# Patient Record
Sex: Female | Born: 1965 | Race: White | Hispanic: No | Marital: Single | Smoking: Never smoker
Health system: Southern US, Community
[De-identification: ages and names within clinical notes are randomized; demographics above are authoritative.]

## PROBLEM LIST (undated history)

## (undated) DIAGNOSIS — F329 Major depressive disorder, single episode, unspecified: Secondary | ICD-10-CM

## (undated) DIAGNOSIS — F419 Anxiety disorder, unspecified: Secondary | ICD-10-CM

## (undated) DIAGNOSIS — K200 Eosinophilic esophagitis: Secondary | ICD-10-CM

## (undated) DIAGNOSIS — G4730 Sleep apnea, unspecified: Secondary | ICD-10-CM

## (undated) DIAGNOSIS — T7840XA Allergy, unspecified, initial encounter: Secondary | ICD-10-CM

## (undated) DIAGNOSIS — H269 Unspecified cataract: Secondary | ICD-10-CM

## (undated) DIAGNOSIS — K2970 Gastritis, unspecified, without bleeding: Secondary | ICD-10-CM

## (undated) DIAGNOSIS — K219 Gastro-esophageal reflux disease without esophagitis: Secondary | ICD-10-CM

## (undated) DIAGNOSIS — S0300XA Dislocation of jaw, unspecified side, initial encounter: Secondary | ICD-10-CM

## (undated) DIAGNOSIS — K529 Noninfective gastroenteritis and colitis, unspecified: Secondary | ICD-10-CM

## (undated) DIAGNOSIS — K760 Fatty (change of) liver, not elsewhere classified: Secondary | ICD-10-CM

## (undated) DIAGNOSIS — E079 Disorder of thyroid, unspecified: Secondary | ICD-10-CM

## (undated) DIAGNOSIS — R7303 Prediabetes: Secondary | ICD-10-CM

## (undated) DIAGNOSIS — F32A Depression, unspecified: Secondary | ICD-10-CM

## (undated) DIAGNOSIS — E785 Hyperlipidemia, unspecified: Secondary | ICD-10-CM

## (undated) HISTORY — DX: Anxiety disorder, unspecified: F41.9

## (undated) HISTORY — DX: Dislocation of jaw, unspecified side, initial encounter: S03.00XA

## (undated) HISTORY — DX: Noninfective gastroenteritis and colitis, unspecified: K52.9

## (undated) HISTORY — DX: Eosinophilic esophagitis: K20.0

## (undated) HISTORY — DX: Depression, unspecified: F32.A

## (undated) HISTORY — PX: NASAL SINUS SURGERY: SHX719

## (undated) HISTORY — DX: Major depressive disorder, single episode, unspecified: F32.9

## (undated) HISTORY — DX: Hyperlipidemia, unspecified: E78.5

## (undated) HISTORY — PX: OTHER SURGICAL HISTORY: SHX169

## (undated) HISTORY — DX: Gastro-esophageal reflux disease without esophagitis: K21.9

## (undated) HISTORY — DX: Unspecified cataract: H26.9

## (undated) HISTORY — DX: Allergy, unspecified, initial encounter: T78.40XA

## (undated) HISTORY — DX: Fatty (change of) liver, not elsewhere classified: K76.0

## (undated) HISTORY — DX: Gastritis, unspecified, without bleeding: K29.70

## (undated) HISTORY — PX: WRIST SURGERY: SHX841

## (undated) HISTORY — PX: BREAST ENHANCEMENT SURGERY: SHX7

## (undated) HISTORY — DX: Disorder of thyroid, unspecified: E07.9

---

## 2004-02-13 DIAGNOSIS — E7800 Pure hypercholesterolemia, unspecified: Secondary | ICD-10-CM | POA: Insufficient documentation

## 2004-02-13 DIAGNOSIS — E785 Hyperlipidemia, unspecified: Secondary | ICD-10-CM | POA: Insufficient documentation

## 2012-05-22 DIAGNOSIS — M26609 Unspecified temporomandibular joint disorder, unspecified side: Secondary | ICD-10-CM | POA: Insufficient documentation

## 2012-05-22 DIAGNOSIS — J302 Other seasonal allergic rhinitis: Secondary | ICD-10-CM | POA: Insufficient documentation

## 2012-05-22 DIAGNOSIS — F3281 Premenstrual dysphoric disorder: Secondary | ICD-10-CM | POA: Insufficient documentation

## 2018-08-16 ENCOUNTER — Encounter: Payer: Self-pay | Admitting: Gastroenterology

## 2018-08-17 DIAGNOSIS — E039 Hypothyroidism, unspecified: Secondary | ICD-10-CM | POA: Insufficient documentation

## 2018-09-11 DIAGNOSIS — F411 Generalized anxiety disorder: Secondary | ICD-10-CM | POA: Insufficient documentation

## 2018-09-11 DIAGNOSIS — F332 Major depressive disorder, recurrent severe without psychotic features: Secondary | ICD-10-CM | POA: Insufficient documentation

## 2018-09-22 ENCOUNTER — Encounter: Payer: Self-pay | Admitting: Psychiatry

## 2018-09-22 ENCOUNTER — Ambulatory Visit (INDEPENDENT_AMBULATORY_CARE_PROVIDER_SITE_OTHER): Payer: PRIVATE HEALTH INSURANCE | Admitting: Psychiatry

## 2018-09-22 VITALS — BP 103/64 | HR 62

## 2018-09-22 DIAGNOSIS — G4700 Insomnia, unspecified: Secondary | ICD-10-CM | POA: Insufficient documentation

## 2018-09-22 DIAGNOSIS — F411 Generalized anxiety disorder: Secondary | ICD-10-CM | POA: Diagnosis not present

## 2018-09-22 DIAGNOSIS — F331 Major depressive disorder, recurrent, moderate: Secondary | ICD-10-CM | POA: Diagnosis not present

## 2018-09-22 DIAGNOSIS — F5101 Primary insomnia: Secondary | ICD-10-CM

## 2018-09-22 MED ORDER — DOXEPIN HCL 10 MG PO CAPS: ORAL_CAPSULE | ORAL | 4 refills | Status: DC

## 2018-09-22 MED ORDER — BUSPIRONE HCL 30 MG PO TABS: 30.0000 mg | ORAL_TABLET | Freq: Two times a day (BID) | ORAL | 2 refills | Status: DC

## 2018-09-22 MED ORDER — SERTRALINE HCL 100 MG PO TABS: 200.0000 mg | ORAL_TABLET | Freq: Every day | ORAL | 4 refills | Status: DC

## 2018-09-22 NOTE — Progress Notes (Signed)
Raffaella Edison [id number] 30-Dec-1965 52 y.o.  Subjective:   Patient ID:  Amy Mcintyre is a 52 y.o. (DOB June 12, 1966) female.  Chief Complaint:  Chief Complaint  Patient presents with  . Anxiety  . Depression  . Sleeping Problem    HPI Amy Mcintyre presents to the office today for follow-up of depression, anxiety, and insomnia. She reports that she had a 'rough patch" for about a week during cross-titration of medication and that this has improved. She reports that her motivation is still low. Reports that she does not have motivation to do anything outside of going to work. Energy has not been good. Reports that sleep is disturbed due to night sweats and is awakening multiple times. Falls asleep without difficulty. Reports that she has continued anxiety, particularly in the morning. Notices anxious thoughts in the morning and has been using meditation to help with this. Reports feeling overwhelmed about the day ahead in the morning. Reports that she has had some GI s/s in the morning and questions if it may be stress related. Reports that TMJ has been worse recently. Has noticed increase in checking routines in the morning and will check things exessively before going to work. Denies any checking behaviors at work. She reports that she does not see a significant difference with Sertraline. Denies recent panic attacks. Describes mood as "I haven't been too down, but I haven't been too happy either." Denies affective dulling.Denies irritability. Reports some anhedonia and decrease in interest. Reports concentration has been "ok" with increased difficulty with concentration in the mornings when anxiety is higher. Denies SI.   Continue stress at work- "It is what it is" and reports that she is not working on the weekends. Reports relationship with ex-husband is "fine."   Medications: I have reviewed the patient's current medications.  Current Outpatient Medications  Medication Sig Dispense Refill  .  atenolol (TENORMIN) 25 MG tablet Take by mouth every morning.    Marland Kitchen atorvastatin (LIPITOR) 40 MG tablet Take 40 mg by mouth daily.    . busPIRone (BUSPAR) 30 MG tablet Take 1 tablet (30 mg total) by mouth 2 (two) times daily. 60 tablet 2  . cholecalciferol (VITAMIN D) 1000 units tablet Take 1,000 Units by mouth daily.    . Eszopiclone 3 MG TABS Take 3 mg by mouth at bedtime. Take immediately before bedtime    . levothyroxine (SYNTHROID, LEVOTHROID) 88 MCG tablet Take 88 mcg by mouth daily.    . meloxicam (MOBIC) 15 MG tablet Take 15 mg by mouth daily as needed for pain.    . naproxen sodium (ALEVE) 220 MG tablet Take 220 mg by mouth daily as needed.    . Omega-3 Fatty Acids (FISH OIL) 1000 MG CAPS Take by mouth.    . doxepin (SINEQUAN) 10 MG capsule Take 1-2 po QHS 60 capsule 4  . sertraline (ZOLOFT) 100 MG tablet Take 2 tablets (200 mg total) by mouth daily. 60 tablet 4   No current facility-administered medications for this visit.     Medication Side Effects: None  Allergies:  Allergies  Allergen Reactions  . Ambien [Zolpidem Tartrate]   . Erythromycin   . Penicillins     Past Medical History:  Diagnosis Date  . Colitis   . Hyperlipidemia   . TMJ (dislocation of temporomandibular joint)     Family History  Problem Relation Age of Onset  . Depression Mother   . Anxiety disorder Mother   . Panic disorder Mother   .  Anxiety disorder Father   . Depression Father   . Depression Maternal Grandfather   . Depression Paternal Grandmother     Social History   Socioeconomic History  . Marital status: Divorced    Spouse name: Not on file  . Number of children: Not on file  . Years of education: Not on file  . Highest education level: Not on file  Occupational History  . Not on file  Social Needs  . Financial resource strain: Not on file  . Food insecurity:    Worry: Not on file    Inability: Not on file  . Transportation needs:    Medical: Not on file    Non-medical:  Not on file  Tobacco Use  . Smoking status: Never Smoker  . Smokeless tobacco: Never Used  Substance and Sexual Activity  . Alcohol use: Not on file  . Drug use: Not on file  . Sexual activity: Not on file  Lifestyle  . Physical activity:    Days per week: Not on file    Minutes per session: Not on file  . Stress: Not on file  Relationships  . Social connections:    Talks on phone: Not on file    Gets together: Not on file    Attends religious service: Not on file    Active member of club or organization: Not on file    Attends meetings of clubs or organizations: Not on file    Relationship status: Not on file  . Intimate partner violence:    Fear of current or ex partner: Not on file    Emotionally abused: Not on file    Physically abused: Not on file    Forced sexual activity: Not on file  Other Topics Concern  . Not on file  Social History Narrative  . Not on file    Past Medical History, Surgical history, Social history, and Family history were reviewed and updated as appropriate.   Please see review of systems for further details on the patient's review from today.   Review of Systems:  Review of Systems  Constitutional: Negative for fever.  Gastrointestinal: Positive for abdominal distention, diarrhea, nausea and vomiting.  Musculoskeletal:       TMJ  Neurological: Negative for headaches.    Objective:   Physical Exam:  BP 103/64   Pulse 62   Physical Exam  Constitutional: She is oriented to person, place, and time. She appears well-developed. No distress.  Musculoskeletal: Normal range of motion.  Neurological: She is alert and oriented to person, place, and time. Coordination normal.  Psychiatric: Her speech is normal and behavior is normal. Judgment and thought content normal. Her mood appears anxious. Her affect is not angry, not blunt, not labile and not inappropriate. Cognition and memory are normal. She exhibits a depressed mood. She expresses no  homicidal and no suicidal ideation. She expresses no suicidal plans and no homicidal plans.    Lab Review:  No results found for: NA, K, CL, CO2, GLUCOSE, BUN, CREATININE, CALCIUM, PROT, ALBUMIN, AST, ALT, ALKPHOS, BILITOT, GFRNONAA, GFRAA  No results found for: WBC, RBC, HGB, HCT, PLT, MCV, MCH, MCHC, RDW, LYMPHSABS, MONOABS, EOSABS, BASOSABS  No results found for: POCLITH, LITHIUM   No results found for: PHENYTOIN, PHENOBARB, VALPROATE, CBMZ   .res Assessment: Plan:   Pt. Seen for 30 minutes and greater than 50% of session spent counseling pt re: potential benefits, risks, and side effects of increasing Sertraline to 200 mg po  qd to improve mood and anxiety. Discussed that she could try taking Lunesta prn only. Also discussed that she could contact office if she would like to consider lowering dose of Lunesta. Pt verbalized understanding. Pt to f/u in 3 months or sooner if clinically indicated.    Generalized anxiety disorder - Plan: sertraline (ZOLOFT) 100 MG tablet, busPIRone (BUSPAR) 30 MG tablet  Moderate episode of recurrent major depressive disorder ([place]) - Plan: sertraline (ZOLOFT) 100 MG tablet  Primary insomnia - Plan: doxepin (SINEQUAN) 10 MG capsule  Please see After Visit Summary for patient specific instructions.  Future Appointments  Date Time Provider [place]  09/25/2018  3:30 PM Thornton Park, MD [facility]  12/22/2018  3:15 PM Thayer Headings, PMHNP [facility] None    No orders of the defined types were placed in this encounter.     -------------------------------

## 2018-09-25 ENCOUNTER — Encounter: Payer: Self-pay | Admitting: Gastroenterology

## 2018-09-25 ENCOUNTER — Other Ambulatory Visit (INDEPENDENT_AMBULATORY_CARE_PROVIDER_SITE_OTHER): Payer: PRIVATE HEALTH INSURANCE

## 2018-09-25 ENCOUNTER — Ambulatory Visit (INDEPENDENT_AMBULATORY_CARE_PROVIDER_SITE_OTHER): Payer: PRIVATE HEALTH INSURANCE | Admitting: Gastroenterology

## 2018-09-25 VITALS — BP 92/60 | HR 64 | Ht 58.75 in | Wt 138.2 lb

## 2018-09-25 DIAGNOSIS — R194 Change in bowel habit: Secondary | ICD-10-CM

## 2018-09-25 DIAGNOSIS — R197 Diarrhea, unspecified: Secondary | ICD-10-CM

## 2018-09-25 LAB — TSH: TSH: 0.22 u[IU]/mL — ABNORMAL LOW (ref 0.35–4.50)

## 2018-09-25 LAB — SEDIMENTATION RATE: Sed Rate: 7 mm/hr (ref 0–30)

## 2018-09-25 LAB — IGA: IgA: 185 mg/dL (ref 68–378)

## 2018-09-25 LAB — C-REACTIVE PROTEIN: CRP: 0.2 mg/dL — AB (ref 0.5–20.0)

## 2018-09-25 NOTE — Patient Instructions (Addendum)
Your provider has requested that you go to the basement level for lab work before leaving today. Press "B" on the elevator. The lab is located at the first door on the left as you exit the elevator.  You have been scheduled for an endoscopy and colonoscopy. Please follow the written instructions given to you at your visit today. Please pick up your prep supplies at the pharmacy within the next 1-3 days. If you use inhalers (even only as needed), please bring them with you on the day of your procedure. Your physician has requested that you go to www.startemmi.com and enter the access code given to you at your visit today. This web site gives a general overview about your procedure. However, you should still follow specific instructions given to you by our office regarding your preparation for the procedure.  

## 2018-09-25 NOTE — Progress Notes (Signed)
Referring Provider: No ref. provider found Primary Care Physician:  Tobe Sos, MD   Reason for Consultation:  Change in bowel habits   IMPRESSION:  Change in bowel habits with diarrhea and intermittent bloating x 1 year    - not responding to trial of probiotics Prior endoscopy in 2012 in [place], [place] History of bleeding hemorrhoids  PLAN: The differential diagnosis of chronic diarrhea without alarm features includes: irritable bowel syndrome, IBD, celiac disease, missed infection (such as giardia), food intolerance,microscopic colitis, thyroid disorder, other functional GI disease. By history, this is less likely to be obstruction.   - Fecal calprotectin, ESR, and CRP to screen for IBD - Giardia testing - TSH - IgA tissue transglutaminase, IgA level to test for celiac disease - EGD with duodenal biopsies given the patient's prior medical care - Colonoscopy with random biopsies and evaluation of the terminal ileum - Obtain records from 2012 with Dr. West Carbo in [place], [place]  I consented the patient at the bedside today discussing the risks, benefits, and alternatives to endoscopic evaluation. In particular, we discussed the risks that include, but are not limited to, reaction to medication, cardiopulmonary compromise, bleeding requiring blood transfusion, aspiration resulting in pneumonia, perforation requiring surgery, and even death. We reviewed the risk of missed lesion including polyps or even cancer. The patient acknowledges these risks and asks that we proceed.   HPI: Amy Mcintyre is a 52 y.o. female seen in consultation for diarrhea, nausea, vomiting, bloating, and eructation. The history is obtained through the patient. Symptoms developed last year. Since late spring she has experienced frequent, daily diarrhea. 3-4 loose BM daily. Baseline 1 BM daily.  Progressively worse since that time. Sometimes more formed than others. Mustardy yellow with some mucous. No blood.    Mornings are particularly bad. Multiple BM before noon. Some early morning nausea and vomiting but this is rare. No fevers, chills, or night sweats. Weight has unintentionally increased.    She is most bothered by associated distension that wil last for hours and fatigue. Resolves spontaneous. +eructation. Not malodorous.  Some days she is unable to wear her clothes because of her increased abdominal girth. On those days there is also anorexia. No other associated symptoms. No identified exacerbating or relieving features.  Immodium PRN with minimal relief. Yoga. Meditation. Sees a counseor. Medications for anxiety and depression. Previously on Probiotics (can't remember the name) but they didn't seem to help after several weeks. She has tried to clue in to her diet but can't determine any patterns.   She has stress and anxiety. She is worried that she may have an organic process in addition to the stress.  Last colonoscopy in 2012 when she was having hemorrhoids that were banded.   Father had colon polyps. No known history of cancer.   Past Medical History:  Diagnosis Date  . Anxiety   . Colitis   . Depression   . Eosinophilic esophagitis   . Hyperlipidemia   . Thyroid disease   . TMJ (dislocation of temporomandibular joint)     Past Surgical History:  Procedure Laterality Date  . BREAST ENHANCEMENT SURGERY    . CESAREAN SECTION    . tummy tuck    . WRIST SURGERY Left     Prior to Admission medications   Medication Sig Start Date End Date Taking? Authorizing Provider  atenolol (TENORMIN) 25 MG tablet Take by mouth every morning.   Yes [provider]  atorvastatin (LIPITOR) 40 MG tablet Take 40  mg by mouth daily.   Yes [provider]  busPIRone (BUSPAR) 30 MG tablet Take 1 tablet (30 mg total) by mouth 2 (two) times daily. 09/22/18  Yes Thayer Headings, PMHNP  cholecalciferol (VITAMIN D) 1000 units tablet Take 1,000 Units by mouth daily.   Yes [provider]  doxepin (SINEQUAN) 10 MG capsule Take 1-2 po QHS 09/22/18  Yes Thayer Headings, PMHNP  Eszopiclone 3 MG TABS Take 3 mg by mouth at bedtime. Take immediately before bedtime   Yes [provider]  levothyroxine (SYNTHROID, LEVOTHROID) 88 MCG tablet Take 88 mcg by mouth daily.   Yes [provider]  meloxicam (MOBIC) 15 MG tablet Take 15 mg by mouth daily as needed for pain.   Yes [provider]  naproxen sodium (ALEVE) 220 MG tablet Take 220 mg by mouth daily as needed.   Yes [provider]  Omega-3 Fatty Acids (FISH OIL) 1000 MG CAPS Take by mouth.   Yes [provider]  sertraline (ZOLOFT) 100 MG tablet Take 2 tablets (200 mg total) by mouth daily. 09/22/18 10/22/18 Yes Thayer Headings, PMHNP    Current Outpatient Medications  Medication Sig Dispense Refill  . atenolol (TENORMIN) 25 MG tablet Take by mouth every morning.    Marland Kitchen atorvastatin (LIPITOR) 40 MG tablet Take 40 mg by mouth daily.    . busPIRone (BUSPAR) 30 MG tablet Take 1 tablet (30 mg total) by mouth 2 (two) times daily. 60 tablet 2  . cholecalciferol (VITAMIN D) 1000 units tablet Take 1,000 Units by mouth daily.    Marland Kitchen doxepin (SINEQUAN) 10 MG capsule Take 1-2 po QHS 60 capsule 4  . Eszopiclone 3 MG TABS Take 3 mg by mouth at bedtime. Take immediately before bedtime    . levothyroxine (SYNTHROID, LEVOTHROID) 88 MCG tablet Take 88 mcg by mouth daily.    . meloxicam (MOBIC) 15 MG tablet Take 15 mg by mouth daily as needed for pain.    . naproxen sodium (ALEVE) 220 MG tablet Take 220 mg by mouth daily as needed.    . Omega-3 Fatty Acids (FISH OIL) 1000 MG CAPS Take by mouth.    . sertraline (ZOLOFT) 100 MG tablet Take 2 tablets (200 mg total) by mouth daily. 60 tablet 4   No current facility-administered medications for this visit.     Allergies as of 09/25/2018 - Review Complete 09/25/2018  Allergen Reaction Noted  . Ambien [zolpidem tartrate]  09/22/2018  .  Erythromycin Swelling 09/22/2018  . Penicillins Swelling 09/22/2018    Family History  Problem Relation Age of Onset  . Depression Mother   . Anxiety disorder Mother   . Panic disorder Mother   . Anxiety disorder Father   . Depression Father   . Colon polyps Father   . Depression Maternal Grandfather   . Depression Paternal Grandmother   . Colon cancer Neg Hx   . Esophageal cancer Neg Hx   . Pancreatic cancer Neg Hx   . Stomach cancer Neg Hx   . Liver disease Neg Hx     Social History   Socioeconomic History  . Marital status: Divorced    Spouse name: Not on file  . Number of children: Not on file  . Years of education: Not on file  . Highest education level: Not on file  Occupational History  . Not on file  Social Needs  . Financial resource strain: Not on file  . Food insecurity:    Worry: Not  on file    Inability: Not on file  . Transportation needs:    Medical: Not on file    Non-medical: Not on file  Tobacco Use  . Smoking status: Never Smoker  . Smokeless tobacco: Never Used  Substance and Sexual Activity  . Alcohol use: Yes    Comment: socially  . Drug use: Never  . Sexual activity: Not on file  Lifestyle  . Physical activity:    Days per week: Not on file    Minutes per session: Not on file  . Stress: Not on file  Relationships  . Social connections:    Talks on phone: Not on file    Gets together: Not on file    Attends religious service: Not on file    Active member of club or organization: Not on file    Attends meetings of clubs or organizations: Not on file    Relationship status: Not on file  . Intimate partner violence:    Fear of current or ex partner: Not on file    Emotionally abused: Not on file    Physically abused: Not on file    Forced sexual activity: Not on file  Other Topics Concern  . Not on file  Social History Narrative  . Not on file    Review of Systems: 12 system ROS is negative except as noted above except for  allergies, anxiety, depression, fatigue, night sweats, and insomnia.   Physical Exam: 92/60 64   General:   Alert, well-nourished, pleasant and cooperative in NAD. She appears her stated age. Head:  Normocephalic and atraumatic. Eyes:  Sclera clear, no icterus.   Conjunctiva pink. Mouth:  No deformity or lesions.   Neck:  Supple; no thyromegaly. Lungs:  Clear throughout to auscultation.   No wheezes.  Heart:  Regular rate and rhythm; no murmurs Abdomen:  Soft, nontender, normal bowel sounds. No tympany. No rebound or guarding. No hepatosplenomegaly. Rectal:  Deferred  Msk:  Symmetrical without gross deformities. Extremities:  No gross deformities or edema. Neurologic:  Alert and  oriented x4;  grossly nonfocal Skin:  No rash or bruise. Psych:  Alert and cooperative. Normal mood and affect.    Aniceto Kyser L. Tarri Glenn Md, MPH [facility] 09/25/2018, 3:41 PM

## 2018-09-26 LAB — TISSUE TRANSGLUTAMINASE, IGA: (tTG) Ab, IgA: 1 U/mL

## 2018-10-05 ENCOUNTER — Other Ambulatory Visit: Payer: PRIVATE HEALTH INSURANCE

## 2018-10-05 ENCOUNTER — Telehealth: Payer: Self-pay | Admitting: Gastroenterology

## 2018-10-05 DIAGNOSIS — R194 Change in bowel habit: Secondary | ICD-10-CM

## 2018-10-05 NOTE — Telephone Encounter (Signed)
I agree that she should reach out to her PCP if she is having chest pain. I did not think that a C diff was needed. However, if she is worried about this, please see if the order can be added to her stool sample. Thank you.

## 2018-10-05 NOTE — Telephone Encounter (Signed)
Thank you :)

## 2018-10-05 NOTE — Telephone Encounter (Signed)
Patient states that she just dropped stool sample off and wants to let nurse know that it wasn't like it's been it's no0t liquid, will that matter?

## 2018-10-05 NOTE — Telephone Encounter (Signed)
Spoke to patient let her know I was not sure about the stool sample. I do know that for Cdiff, it needs to be liquid but this was not one of the tests ordered. Patient also thinks she is having chest pain due to her anxiety. States that she was fine while here, but after being home and "thinking about her procedures" she started feeling tightness in her chest. She does take medication for anxiety and I asked her to contact her PCP to have them evaluate her.

## 2018-10-05 NOTE — Telephone Encounter (Signed)
Sorry, she was not concerned about Cdiff, she just wanted to make sure that her more formed stool would be acceptable for the tests. Mainly, just reassurance.

## 2018-10-06 LAB — GIARDIA ANTIGEN
MICRO NUMBER:: 91311756
RESULT:: NOT DETECTED
SPECIMEN QUALITY: ADEQUATE

## 2018-10-12 LAB — CALPROTECTIN, FECAL: CALPROTECTIN, FECAL: 19 ug/g (ref 0–120)

## 2018-10-13 ENCOUNTER — Encounter: Payer: Self-pay | Admitting: Gastroenterology

## 2018-10-19 ENCOUNTER — Ambulatory Visit (AMBULATORY_SURGERY_CENTER): Payer: PRIVATE HEALTH INSURANCE | Admitting: Gastroenterology

## 2018-10-19 ENCOUNTER — Encounter: Payer: Self-pay | Admitting: Gastroenterology

## 2018-10-19 ENCOUNTER — Telehealth: Payer: Self-pay | Admitting: Gastroenterology

## 2018-10-19 ENCOUNTER — Encounter: Payer: Self-pay | Admitting: Emergency Medicine

## 2018-10-19 VITALS — BP 133/82 | HR 76 | Temp 99.1°F | Resp 12 | Ht <= 58 in | Wt 138.0 lb

## 2018-10-19 DIAGNOSIS — K648 Other hemorrhoids: Secondary | ICD-10-CM

## 2018-10-19 DIAGNOSIS — K222 Esophageal obstruction: Secondary | ICD-10-CM | POA: Diagnosis not present

## 2018-10-19 DIAGNOSIS — R194 Change in bowel habit: Secondary | ICD-10-CM

## 2018-10-19 DIAGNOSIS — K3189 Other diseases of stomach and duodenum: Secondary | ICD-10-CM | POA: Diagnosis not present

## 2018-10-19 DIAGNOSIS — R197 Diarrhea, unspecified: Secondary | ICD-10-CM

## 2018-10-19 DIAGNOSIS — D124 Benign neoplasm of descending colon: Secondary | ICD-10-CM | POA: Diagnosis not present

## 2018-10-19 DIAGNOSIS — K5730 Diverticulosis of large intestine without perforation or abscess without bleeding: Secondary | ICD-10-CM | POA: Diagnosis not present

## 2018-10-19 DIAGNOSIS — K2900 Acute gastritis without bleeding: Secondary | ICD-10-CM

## 2018-10-19 MED ORDER — SODIUM CHLORIDE 0.9 % IV SOLN: 500.0000 mL | Freq: Once | INTRAVENOUS | Status: DC

## 2018-10-19 MED ORDER — PANTOPRAZOLE SODIUM 40 MG PO TBEC: 40.0000 mg | DELAYED_RELEASE_TABLET | Freq: Every day | ORAL | 3 refills | Status: DC

## 2018-10-19 NOTE — Op Note (Addendum)
[facility] Patient Name: Amy Mcintyre Procedure Date: 10/19/2018 12:54 PM MRN: [id number] Endoscopist: Thornton Park MD, MD Age: 52 Referring MD:  Date of Birth: 02-25-1966 Gender: Female Account #: [id number] Procedure:                Colonoscopy Indications:              Change in bowel habits Medicines:                See the Anesthesia note for documentation of the                            administered medications Procedure:                Pre-Anesthesia Assessment:                           - Prior to the procedure, a History and Physical                            was performed, and patient medications and                            allergies were reviewed. The patient's tolerance of                            previous anesthesia was also reviewed. The risks                            and benefits of the procedure and the sedation                            options and risks were discussed with the patient.                            All questions were answered, and informed consent                            was obtained. Prior Anticoagulants: The patient has                            taken no previous anticoagulant or antiplatelet                            agents. ASA Grade Assessment: III - A patient with                            severe systemic disease. After reviewing the risks                            and benefits, the patient was deemed in                            satisfactory condition to undergo the procedure.  After obtaining informed consent, the colonoscope                            was passed under direct vision. Throughout the                            procedure, the patient's blood pressure, pulse, and                            oxygen saturations were monitored continuously. The                            Colonoscope was introduced through the anus and                            advanced to the the terminal  ileum, with                            identification of the appendiceal orifice and IC                            valve. The colonoscopy was performed without                            difficulty. The patient tolerated the procedure                            well. The quality of the bowel preparation was good. Scope In: 1:16:55 PM Scope Out: 1:31:03 PM Scope Withdrawal Time: 0 hours 11 minutes 34 seconds  Total Procedure Duration: 0 hours 14 minutes 8 seconds  Findings:                 The perianal and digital rectal examinations were                            normal.                           A few small-mouthed diverticula were found in the                            sigmoid colon and descending colon.                           A 2 mm polyp was found in the proximal descending                            colon. The polyp was sessile. The polyp was removed                            with a cold biopsy forceps. Resection and retrieval                            were complete.  The colon (entire examined portion) appeared normal                            except for focal erythema in the distal sigmoid                            colon. The inflammation is in the area of the                            diverticulosis. Biopsies were taken with a cold                            forceps for histology from the sigmoid colon                            separately from the remainder of the colon.                           Non-bleeding internal hemorrhoids were found.                           The exam was otherwise without abnormality on                            direct and retroflexion views. Complications:            No immediate complications. Estimated Blood Loss:     Estimated blood loss: none. Impression:               - Diverticulosis in the sigmoid colon and in the                            descending colon.                           - One 2 mm polyp in the  proximal descending colon,                            removed with a cold biopsy forceps. Resected and                            retrieved.                           - The entire examined colon is normal except focal                            erythema in the sigmoid colon in the area of the                            diverticulosis. Biopsied.                           - Non-bleeding internal hemorrhoids.                           -  The examination was otherwise normal on direct                            and retroflexion views. Recommendation:           - Discharge patient to home.                           - Resume previous diet. High fiber diet recommended.                           - Continue present medications.                           - Await pathology results.                           - Repeat colonoscopy in 5 years for surveillance.                           - Return to GI office in 2-4 weeks. Thornton Park MD, MD 10/19/2018 1:43:43 PM This report has been signed electronically.

## 2018-10-19 NOTE — Op Note (Signed)
[facility] Patient Name: Amy Mcintyre Procedure Date: 10/19/2018 12:55 PM MRN: [id number] Endoscopist: Thornton Park MD, MD Age: 52 Referring MD:  Date of Birth: 29-Nov-1966 Gender: Female Account #: [id number] Procedure:                Upper GI endoscopy Indications:              Abdominal bloating, Diarrhea Medicines:                See the Anesthesia note for documentation of the                            administered medications Procedure:                Pre-Anesthesia Assessment:                           - Prior to the procedure, a History and Physical                            was performed, and patient medications and                            allergies were reviewed. The patient's tolerance of                            previous anesthesia was also reviewed. The risks                            and benefits of the procedure and the sedation                            options and risks were discussed with the patient.                            All questions were answered, and informed consent                            was obtained. Prior Anticoagulants: The patient has                            taken no previous anticoagulant or antiplatelet                            agents. ASA Grade Assessment: III - A patient with                            severe systemic disease. After reviewing the risks                            and benefits, the patient was deemed in                            satisfactory condition to undergo the procedure.  After obtaining informed consent, the endoscope was                            passed under direct vision. Throughout the                            procedure, the patient's blood pressure, pulse, and                            oxygen saturations were monitored continuously. The                            Model GIF-HQ190 [id number]) scope was introduced                            through the mouth,  and advanced to the third part                            of duodenum. The upper GI endoscopy was                            accomplished without difficulty. The patient                            tolerated the procedure well. Scope In: Scope Out: Findings:                 The examined esophagus was normal although the                            esophageal lumen has a slightly ringed appearance.                            There is a widely patent Schatzki's ring at the GE                            junction. Biopsies were taken with a cold forceps                            for histology.                           Moderate inflammation was found in the gastric                            body. Biopsies were taken with a cold forceps for                            histology.                           The examined duodenum was normal. Biopsies were                            taken with a cold forceps for  histology.                           The exam was otherwise without abnormality. Complications:            No immediate complications. Estimated Blood Loss:     Estimated blood loss: none. Impression:               - Normal esophagus except for subtle ringed                            appearance. Biopsied.                           -Widely patent Schatzki's ring.                           - Gastritis. Biopsied.                           - Normal examined duodenum. Biopsied.                           - The examination was otherwise normal. Recommendation:           - Await pathology results.                           - Avoid all NSAIDs                           - Pantoprazole 40 mg daily x8 weeks                           -Proceed with colonoscopy today as previously                            planned Thornton Park MD, MD 10/19/2018 1:39:36 PM This report has been signed electronically.

## 2018-10-19 NOTE — Patient Instructions (Signed)
YOU HAD AN ENDOSCOPIC PROCEDURE TODAY AT [facility]:   Refer to the procedure report that was given to you for any specific questions about what was found during the examination.  If the procedure report does not answer your questions, please call your gastroenterologist to clarify.  If you requested that your care partner not be given the details of your procedure findings, then the procedure report has been included in a sealed envelope for you to review at your convenience later.  YOU SHOULD EXPECT: Some feelings of bloating in the abdomen. Passage of more gas than usual.  Walking can help get rid of the air that was put into your GI tract during the procedure and reduce the bloating. If you had a lower endoscopy (such as a colonoscopy or flexible sigmoidoscopy) you may notice spotting of blood in your stool or on the toilet paper. If you underwent a bowel prep for your procedure, you may not have a normal bowel movement for a few days.  Please Note:  You might notice some irritation and congestion in your nose or some drainage.  This is from the oxygen used during your procedure.  There is no need for concern and it should clear up in a day or so.  SYMPTOMS TO REPORT IMMEDIATELY:   Following lower endoscopy (colonoscopy or flexible sigmoidoscopy):  Excessive amounts of blood in the stool  Significant tenderness or worsening of abdominal pains  Swelling of the abdomen that is new, acute  Fever of 100F or higher   Following upper endoscopy (EGD)  Vomiting of blood or coffee ground material  New chest pain or pain under the shoulder blades  Painful or persistently difficult swallowing  New shortness of breath  Fever of 100F or higher  Black, tarry-looking stools  For urgent or emergent issues, a gastroenterologist can be reached at any hour by calling [phone].   DIET:  We do recommend a small meal at first, but then you may proceed to your regular diet.  Drink  plenty of fluids but you should avoid alcoholic beverages for 24 hours.  MEDICATIONS: Continue present medications. Use Pantoprazole 40 mg daily for 8 weeks. Avoid all non-steroidal anti-inflammatory drugs.  Please see handouts given to you by your recovery nurse.  Follow up with Dr. Tarri Glenn in her office in 2 to 4 weeks.  ACTIVITY:  You should plan to take it easy for the rest of today and you should NOT DRIVE or use heavy machinery until tomorrow (because of the sedation medicines used during the test).    FOLLOW UP: Our staff will call the number listed on your records the next business day following your procedure to check on you and address any questions or concerns that you may have regarding the information given to you following your procedure. If we do not reach you, we will leave a message.  However, if you are feeling well and you are not experiencing any problems, there is no need to return our call.  We will assume that you have returned to your regular daily activities without incident.  If any biopsies were taken you will be contacted by phone or by letter within the next 1-3 weeks.  Please call us at [phone] if you have not heard about the biopsies in 3 weeks.   Thank you for allowing [place] to provide for your healthcare needs today.  SIGNATURES/CONFIDENTIALITY: You and/or your care partner have signed paperwork which will be entered into  your electronic medical record.  These signatures attest to the fact that that the information above on your After Visit Summary has been reviewed and is understood.  Full responsibility of the confidentiality of this discharge information lies with you and/or your care-partner.

## 2018-10-19 NOTE — Progress Notes (Signed)
A/ox3 pleased with MAC, report to RN 

## 2018-10-19 NOTE — Progress Notes (Signed)
Called to room to assist during endoscopic procedure.  Patient ID and intended procedure confirmed with present staff. Received instructions for my participation in the procedure from the performing physician.  

## 2018-10-20 ENCOUNTER — Telehealth: Payer: Self-pay

## 2018-10-20 NOTE — Telephone Encounter (Signed)
  Follow up Call-  Call back number 10/19/2018  Post procedure Call Back phone  # [phone]  Permission to leave phone message Yes     Patient questions:  Do you have a fever, pain , or abdominal swelling? No. Pain Score  0 *  Have you tolerated food without any problems? No. Appetite is poor and this concerns pt.  Reviewed MD's recommendations.  Pantoprazole 40mg  daily for 8 weeks.  Pt wants follow-up with MD.  Advised her to follow the above recommendation/med before calling for OV and await pathology.  Pt agreed.  RN advised however if new or worsening issues call sooner.  Pt voiced that this symptom is unchanged since before EGD.  Have you been able to return to your normal activities? No.  Do you have any questions about your discharge instructions: Diet   No. Medications  No. Follow up visit  No.  Do you have questions or concerns about your Care? No.  Actions: * If pain score is 4 or above: No action needed, pain <4.

## 2018-11-01 ENCOUNTER — Telehealth: Payer: Self-pay | Admitting: Gastroenterology

## 2018-11-06 NOTE — Telephone Encounter (Signed)
Patient calling to schedule her procedure follow up.  Scheduled for 11/17/18

## 2018-11-07 ENCOUNTER — Ambulatory Visit (INDEPENDENT_AMBULATORY_CARE_PROVIDER_SITE_OTHER): Payer: PRIVATE HEALTH INSURANCE | Admitting: Psychiatry

## 2018-11-07 ENCOUNTER — Encounter: Payer: Self-pay | Admitting: Gastroenterology

## 2018-11-07 ENCOUNTER — Encounter: Payer: Self-pay | Admitting: Psychiatry

## 2018-11-07 VITALS — BP 139/87 | HR 96

## 2018-11-07 DIAGNOSIS — F331 Major depressive disorder, recurrent, moderate: Secondary | ICD-10-CM | POA: Diagnosis not present

## 2018-11-07 DIAGNOSIS — F411 Generalized anxiety disorder: Secondary | ICD-10-CM

## 2018-11-07 DIAGNOSIS — F5101 Primary insomnia: Secondary | ICD-10-CM

## 2018-11-07 MED ORDER — DEPLIN 15 15-90.314 MG PO CAPS: 15.0000 mg | ORAL_CAPSULE | Freq: Every day | ORAL | 0 refills | Status: AC

## 2018-11-07 MED ORDER — CLONAZEPAM 1 MG PO TABS: ORAL_TABLET | ORAL | 2 refills | Status: DC

## 2018-11-07 NOTE — Progress Notes (Signed)
Amy Mcintyre [id number] Aug 19, 1966 52 y.o.  Subjective:   Patient ID:  Amy Mcintyre is a 52 y.o. (DOB 30-Apr-1966) female.  Chief Complaint:  Chief Complaint  Patient presents with  . Anxiety  . Insomnia  . Depression    HPI Amy Mcintyre presents to the office today for follow-up of depression, anxiety, and insomnia.  She reports,"I have really been struggling with sleep and anxiety."  She reports that she has not noticed any significant difference with stopping Buspar or Lunesta. She feels that Sertraline has been helpful for depression since she is not crying as often. Reports that she continues to experience some depression. Reports that Doxepin does not seem to be effective. Reports that she has some nights that she will sleep only 2-4 hours a night and other nights she sleeps throughout the night and has been unable to identify any pattern or trigger. Reports energy and motivation have been ok. Reports that her appetite "comes and goes." Reports that her concentration is impaired and has difficulty staying focused at work and is easily distracted.   She reports that Klonopin was effective in the past for her anxiety and insomnia. She reports that when she had her colonoscopy she had severe anxiety with intrusive thoughts that she may die and had insomnia for one week and was unable to work during that time. She reports that she has severe anxiety in the morning and feeling nervous and thinking about what the day may hold. Reports that panic attacks have improved some over the last few weeks to months. Continued stressors at work and frequent worry about work.   Reports that during endoscopy they found a ring that is being biopsied, diverticuli, a colon polyp, and an internal hemorrhoid.   Reports relationship has been going well.   Review of Systems:  Review of Systems  Constitutional: Positive for diaphoresis.  Gastrointestinal:       Reports that her vomiting has decreased. Reports  that she continues to have some diarrhea.   Musculoskeletal: Negative for gait problem.  Neurological: Negative for tremors.       Reports that her jaw has started to go numb and it occasionally radiates to her sinus.   Psychiatric/Behavioral:       Please refer to HPI    Medications: I have reviewed the patient's current medications.  Current Outpatient Medications  Medication Sig Dispense Refill  . atorvastatin (LIPITOR) 40 MG tablet Take 40 mg by mouth daily.    Marland Kitchen levothyroxine (SYNTHROID, LEVOTHROID) 75 MCG tablet Take 75 mcg by mouth daily.     . meloxicam (MOBIC) 15 MG tablet Take 15 mg by mouth daily as needed for pain.    . Omega-3 Fatty Acids (FISH OIL) 1000 MG CAPS Take by mouth.    . pantoprazole (PROTONIX) 40 MG tablet Take 1 tablet (40 mg total) by mouth daily. 90 tablet 3  . atenolol (TENORMIN) 25 MG tablet Take by mouth every morning.    . cholecalciferol (VITAMIN D) 1000 units tablet Take 1,000 Units by mouth daily.    . clonazePAM (KLONOPIN) 1 MG tablet Take 1/2-1 tab po qd prn panic or insomnia 30 tablet 2  . L-Methylfolate-Algae (DEPLIN 15) 15-90.314 MG CAPS Take 15 mg by mouth daily. Samples provided  0  . sertraline (ZOLOFT) 100 MG tablet Take 2 tablets (200 mg total) by mouth daily. 60 tablet 4   Current Facility-Administered Medications  Medication Dose Route Frequency Provider Last Rate Last Dose  . 0.9 %  sodium chloride infusion  500 mL Intravenous Once Thornton Park, MD        Medication Side Effects: None  Allergies:  Allergies  Allergen Reactions  . Ambien [Zolpidem Tartrate]   . Erythromycin Swelling  . Penicillins Swelling    Past Medical History:  Diagnosis Date  . Anxiety   . Colitis   . Depression   . Eosinophilic esophagitis   . Gastritis   . GERD (gastroesophageal reflux disease)   . Hyperlipidemia   . Thyroid disease   . TMJ (dislocation of temporomandibular joint)     Family History  Problem Relation Age of Onset  .  Anxiety disorder Mother   . Panic disorder Mother   . OCD Mother   . Anxiety disorder Father   . Depression Father   . Colon polyps Father   . Depression Maternal Grandfather   . Depression Paternal Grandmother   . Drug abuse Brother   . Colon cancer Neg Hx   . Esophageal cancer Neg Hx   . Pancreatic cancer Neg Hx   . Stomach cancer Neg Hx   . Liver disease Neg Hx   . Rectal cancer Neg Hx     Social History   Socioeconomic History  . Marital status: Divorced    Spouse name: Not on file  . Number of children: Not on file  . Years of education: Not on file  . Highest education level: Not on file  Occupational History  . Not on file  Social Needs  . Financial resource strain: Not on file  . Food insecurity:    Worry: Not on file    Inability: Not on file  . Transportation needs:    Medical: Not on file    Non-medical: Not on file  Tobacco Use  . Smoking status: Never Smoker  . Smokeless tobacco: Never Used  Substance and Sexual Activity  . Alcohol use: Yes    Comment: socially  . Drug use: Never  . Sexual activity: Not on file  Lifestyle  . Physical activity:    Days per week: Not on file    Minutes per session: Not on file  . Stress: Not on file  Relationships  . Social connections:    Talks on phone: Not on file    Gets together: Not on file    Attends religious service: Not on file    Active member of club or organization: Not on file    Attends meetings of clubs or organizations: Not on file    Relationship status: Not on file  . Intimate partner violence:    Fear of current or ex partner: Not on file    Emotionally abused: Not on file    Physically abused: Not on file    Forced sexual activity: Not on file  Other Topics Concern  . Not on file  Social History Narrative  . Not on file    Past Medical History, Surgical history, Social history, and Family history were reviewed and updated as appropriate.   Please see review of systems for further  details on the patient's review from today.   Objective:   Physical Exam:  BP 139/87   Pulse 96   Physical Exam  Constitutional: She is oriented to person, place, and time. She appears well-developed. No distress.  Musculoskeletal: She exhibits no deformity.  Neurological: She is alert and oriented to person, place, and time. Coordination normal.  Psychiatric: Her speech is normal and behavior is normal. Judgment  and thought content normal. Her mood appears anxious. Her affect is not angry, not blunt, not labile and not inappropriate. Cognition and memory are normal. She does not exhibit a depressed mood. She expresses no homicidal and no suicidal ideation. She expresses no suicidal plans and no homicidal plans.  Insight intact. No auditory or visual hallucinations. No delusions.     Lab Review:  No results found for: NA, K, CL, CO2, GLUCOSE, BUN, CREATININE, CALCIUM, PROT, ALBUMIN, AST, ALT, ALKPHOS, BILITOT, GFRNONAA, GFRAA  No results found for: WBC, RBC, HGB, HCT, PLT, MCV, MCH, MCHC, RDW, LYMPHSABS, MONOABS, EOSABS, BASOSABS  No results found for: POCLITH, LITHIUM   No results found for: PHENYTOIN, PHENOBARB, VALPROATE, CBMZ   .res Assessment: Plan:   Patient seen for 30 minutes and greater than 50% of visit spent counseling patient to include discussing that abnormal thyroid could be negatively affecting mood and insomnia.  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  Discussed considering propanolol as needed in the future if anxiety does not improve. Continue sertraline 200 mg daily for mood and anxiety. Start Klonopin 1 mg 1/2 to 1 tablet daily as needed for anxiety or insomnia. Deplin 15 mg daily for augmentation of depression. Generalized anxiety disorder - Plan: clonazePAM (KLONOPIN) 1 MG tablet  Moderate episode of recurrent major depressive disorder (HCC)  Primary insomnia - Plan:  clonazePAM (KLONOPIN) 1 MG tablet  Please see After Visit Summary for patient specific instructions.  Future Appointments  Date Time Provider [place]  11/17/2018  8:45 AM Thornton Park, MD [facility]  12/22/2018  3:15 PM Thayer Headings, PMHNP [facility] None    No orders of the defined types were placed in this encounter.     -------------------------------

## 2018-11-17 ENCOUNTER — Ambulatory Visit (INDEPENDENT_AMBULATORY_CARE_PROVIDER_SITE_OTHER): Payer: PRIVATE HEALTH INSURANCE | Admitting: Gastroenterology

## 2018-11-17 ENCOUNTER — Encounter: Payer: Self-pay | Admitting: Gastroenterology

## 2018-11-17 VITALS — BP 128/84 | HR 96 | Ht 59.0 in | Wt 139.1 lb

## 2018-11-17 DIAGNOSIS — R194 Change in bowel habit: Secondary | ICD-10-CM

## 2018-11-17 DIAGNOSIS — K219 Gastro-esophageal reflux disease without esophagitis: Secondary | ICD-10-CM

## 2018-11-17 DIAGNOSIS — R197 Diarrhea, unspecified: Secondary | ICD-10-CM | POA: Diagnosis not present

## 2018-11-17 DIAGNOSIS — D369 Benign neoplasm, unspecified site: Secondary | ICD-10-CM | POA: Diagnosis not present

## 2018-11-17 NOTE — Progress Notes (Signed)
Referring Provider: Tobe Sos, MD Primary Care Physician:  Tobe Sos, MD   Chief complaint:  diarrhea   IMPRESSION:  Chronic diarrhea     - testing for celiac negative, giardia    - normal fecal calprotectin    - negative colon and duodenal biopsies Reflux (biopsy proven) on EGD 10/19/18 Tubular adenoma 2019    - surveillance colonoscopy due 2024 Change in bowel habits with diarrhea and intermittent bloating x 1 year    - not responding to trial of probiotics Prior endoscopy in 2012 in [place], [place] History of bleeding hemorrhoids Family history of colon polyps (father in his 31s)   Chronic diarrhea may be multifactorial. Recent TSH suggests a component of hypothyroidism. Must also consider small intestinal bacterial overgrowth +/- functional diarrhea.   PLAN: Protonix (complete 8 weeks) SIBO testing if she does not continue to improve Surveillance colonoscopy due 2024 Return to clinic in 3-4 months    HPI: Amy Mcintyre is a 52 y.o. female returns in follow-up after her initial consultation and subsequent endoscopy. Pleased with her procedure. Thankful for the endoscopy team who helped her through her preprocedure anxiety. She did not get a copy of her pathology letter. I printed out a copy and reviewed it with her during this visit today.   She had a tubular adenoma removed. Biopsies showed mild gastropathy and reflux. Biopsies of the colon and duodenum were normal.  Fecal calprotectin was normal at 19. Giardia testing negative. IgA and TTGA are normal.  TSH was 0.22. Now seeing an endocrinologist given her thyroid dysfunction. She is expecting her medication changes to take 6-8 weeks.   Current symptoms include: multiple early morning BMs. Things are better and more formed. Continues to have some early morning nausea. Intermittent early morning emesis. Significant eructation. Only difference is Protonix. No other changes.   She is having significant anxiety  and depression. She wonders if this is contributing to her symptoms.  Recent TMJ. Working on yoga and medication. Working with a Social worker to manage her stress. 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  She wonders if this is contributing to her symptoms.    Past Medical History:  Diagnosis Date  . Anxiety   . Colitis   . Depression   . Eosinophilic esophagitis   . Gastritis   . GERD (gastroesophageal reflux disease)   . Hyperlipidemia   . Thyroid disease   . TMJ (dislocation of temporomandibular joint)     Past Surgical History:  Procedure Laterality Date  . BREAST ENHANCEMENT SURGERY    . CESAREAN SECTION    . tummy tuck    . WRIST SURGERY Left     Current Outpatient Medications  Medication Sig Dispense Refill  . atorvastatin (LIPITOR) 40 MG tablet Take 40 mg by mouth daily.    . cholecalciferol (VITAMIN D) 1000 units tablet Take 1,000 Units by mouth daily.    . clonazePAM (KLONOPIN) 1 MG tablet Take 1/2-1 tab po qd prn panic or insomnia 30 tablet 2  . L-Methylfolate-Algae (DEPLIN 15) 15-90.314 MG CAPS Take 15 mg by mouth daily. Samples provided  0  . levothyroxine (SYNTHROID, LEVOTHROID) 75 MCG tablet Take 75 mcg by mouth daily.     . meloxicam (MOBIC) 15 MG tablet Take 15 mg by mouth daily as needed for pain.    . pantoprazole (PROTONIX) 40 MG tablet Take 1 tablet (40 mg total) by mouth daily. 90 tablet 3  .  sertraline (ZOLOFT) 100 MG tablet Take 2 tablets (200 mg total) by mouth daily. 60 tablet 4   No current facility-administered medications for this visit.     Allergies as of 11/17/2018 - Review Complete 11/17/2018  Allergen Reaction Noted  . Ambien [zolpidem tartrate]  09/22/2018  . Erythromycin Swelling 09/22/2018  . Penicillins Swelling 09/22/2018    Family History  Problem Relation Age of Onset  . Anxiety disorder Mother   . Panic disorder Mother   . OCD Mother   . Anxiety disorder Father   . Depression Father     . Colon polyps Father   . Depression Maternal Grandfather   . Depression Paternal Grandmother   . Drug abuse Brother   . Colon cancer Neg Hx   . Esophageal cancer Neg Hx   . Pancreatic cancer Neg Hx   . Stomach cancer Neg Hx   . Liver disease Neg Hx   . Rectal cancer Neg Hx     Social History   Socioeconomic History  . Marital status: Divorced    Spouse name: Not on file  . Number of children: Not on file  . Years of education: Not on file  . Highest education level: Not on file  Occupational History  . Not on file  Social Needs  . Financial resource strain: Not on file  . Food insecurity:    Worry: Not on file    Inability: Not on file  . Transportation needs:    Medical: Not on file    Non-medical: Not on file  Tobacco Use  . Smoking status: Never Smoker  . Smokeless tobacco: Never Used  Substance and Sexual Activity  . Alcohol use: Yes    Comment: socially  . Drug use: Never  . Sexual activity: Not on file  Lifestyle  . Physical activity:    Days per week: Not on file    Minutes per session: Not on file  . Stress: Not on file  Relationships  . Social connections:    Talks on phone: Not on file    Gets together: Not on file    Attends religious service: Not on file    Active member of club or organization: Not on file    Attends meetings of clubs or organizations: Not on file    Relationship status: Not on file  . Intimate partner violence:    Fear of current or ex partner: Not on file    Emotionally abused: Not on file    Physically abused: Not on file    Forced sexual activity: Not on file  Other Topics Concern  . Not on file  Social History Narrative  . Not on file    [facility] Weights   11/17/18 0846  Weight: 139 lb 2 oz (63.1 kg)    Physical Exam: Vital signs were reviewed. General:   Alert, well-nourished, pleasant and cooperative in NAD. Appears her stated age.  Head:  Normocephalic and atraumatic. Lungs:  Clear throughout to auscultation.    No wheezes. Heart:  Regular rate and rhythm; no murmurs Abdomen:  Soft, nontender, normal bowel sounds. No rebound or guarding. No hepatosplenomegaly Msk:  Symmetrical without gross deformities. Extremities:  No gross deformities or edema. Neurologic:  Alert and  oriented x4;  grossly nonfocal Skin:  No rash or bruise. Psych:  Alert and cooperative. Normal mood. Flat affect.   Tenecia Ignasiak L. Tarri Glenn, MD, MPH [facility] 11/19/2018, 8:57 PM

## 2018-11-17 NOTE — Patient Instructions (Addendum)
You will be due for a recall colonoscopy in 2024. We will send you a reminder in the mail when it gets closer to that time.  Continue Protonix.

## 2018-11-19 ENCOUNTER — Encounter: Payer: Self-pay | Admitting: Gastroenterology

## 2018-11-24 ENCOUNTER — Telehealth: Payer: Self-pay | Admitting: Psychiatry

## 2018-11-24 DIAGNOSIS — F411 Generalized anxiety disorder: Secondary | ICD-10-CM

## 2018-11-24 DIAGNOSIS — F5101 Primary insomnia: Secondary | ICD-10-CM

## 2018-11-24 MED ORDER — CLONAZEPAM 1 MG PO TABS: ORAL_TABLET | ORAL | 2 refills | Status: DC

## 2018-11-24 NOTE — Telephone Encounter (Signed)
Script sent and called pt to notify her. Pt reports that she has been taking the 1/2 tab in the day more than expected to help with panic attacks.

## 2018-11-24 NOTE — Telephone Encounter (Signed)
Pt got Klonopin and was taking it 1/2 pill in am, and 1 in the pm . She thought it was Rxed that way but realizes now that it was 1/2- 1 day #30. She is having to take 1/2 tab in the am to help with work anxiety and 1 in the pm to help with sleep. She is going to run out before refill is available and is worried because she is heading out of town Dec 26 . Can she get an early RF? If not please instruct her how to take the med so she doesn't w/d.

## 2018-12-13 ENCOUNTER — Telehealth: Payer: Self-pay | Admitting: Psychiatry

## 2018-12-13 ENCOUNTER — Ambulatory Visit (HOSPITAL_COMMUNITY): Payer: PRIVATE HEALTH INSURANCE

## 2018-12-13 ENCOUNTER — Ambulatory Visit (INDEPENDENT_AMBULATORY_CARE_PROVIDER_SITE_OTHER): Payer: PRIVATE HEALTH INSURANCE | Admitting: Gastroenterology

## 2018-12-13 ENCOUNTER — Other Ambulatory Visit: Payer: PRIVATE HEALTH INSURANCE

## 2018-12-13 ENCOUNTER — Telehealth: Payer: Self-pay | Admitting: Gastroenterology

## 2018-12-13 ENCOUNTER — Other Ambulatory Visit (INDEPENDENT_AMBULATORY_CARE_PROVIDER_SITE_OTHER): Payer: PRIVATE HEALTH INSURANCE

## 2018-12-13 ENCOUNTER — Ambulatory Visit (HOSPITAL_COMMUNITY)
Admission: RE | Admit: 2018-12-13 | Discharge: 2018-12-13 | Disposition: A | Discharge status: Home / Self Care | Payer: PRIVATE HEALTH INSURANCE | Source: Ambulatory Visit | Attending: Gastroenterology | Admitting: Gastroenterology

## 2018-12-13 ENCOUNTER — Encounter: Payer: Self-pay | Admitting: Gastroenterology

## 2018-12-13 DIAGNOSIS — R1013 Epigastric pain: Secondary | ICD-10-CM

## 2018-12-13 DIAGNOSIS — K529 Noninfective gastroenteritis and colitis, unspecified: Secondary | ICD-10-CM

## 2018-12-13 DIAGNOSIS — R112 Nausea with vomiting, unspecified: Secondary | ICD-10-CM | POA: Diagnosis present

## 2018-12-13 DIAGNOSIS — E079 Disorder of thyroid, unspecified: Secondary | ICD-10-CM

## 2018-12-13 DIAGNOSIS — K219 Gastro-esophageal reflux disease without esophagitis: Secondary | ICD-10-CM

## 2018-12-13 LAB — LIPASE: Lipase: 41 U/L (ref 11.0–59.0)

## 2018-12-13 LAB — CBC WITH DIFFERENTIAL/PLATELET
Basophils Absolute: 0 10*3/uL (ref 0.0–0.1)
Basophils Relative: 0.7 % (ref 0.0–3.0)
Eosinophils Absolute: 0 10*3/uL (ref 0.0–0.7)
Eosinophils Relative: 0.4 % (ref 0.0–5.0)
HCT: 40.6 % (ref 36.0–46.0)
Hemoglobin: 13.5 g/dL (ref 12.0–15.0)
Lymphocytes Relative: 31 % (ref 12.0–46.0)
Lymphs Abs: 1.4 10*3/uL (ref 0.7–4.0)
MCHC: 33.3 g/dL (ref 30.0–36.0)
MCV: 90.6 fl (ref 78.0–100.0)
MONO ABS: 0.4 10*3/uL (ref 0.1–1.0)
Monocytes Relative: 8.3 % (ref 3.0–12.0)
NEUTROS ABS: 2.8 10*3/uL (ref 1.4–7.7)
Neutrophils Relative %: 59.6 % (ref 43.0–77.0)
Platelets: 258 10*3/uL (ref 150.0–400.0)
RBC: 4.47 Mil/uL (ref 3.87–5.11)
RDW: 13.7 % (ref 11.5–15.5)
WBC: 4.6 10*3/uL (ref 4.0–10.5)

## 2018-12-13 LAB — COMPREHENSIVE METABOLIC PANEL
ALT: 29 U/L (ref 0–35)
AST: 22 U/L (ref 0–37)
Albumin: 4.7 g/dL (ref 3.5–5.2)
Alkaline Phosphatase: 82 U/L (ref 39–117)
BUN: 23 mg/dL (ref 6–23)
CO2: 29 mEq/L (ref 19–32)
Calcium: 10 mg/dL (ref 8.4–10.5)
Chloride: 102 mEq/L (ref 96–112)
Creatinine, Ser: 0.69 mg/dL (ref 0.40–1.20)
GFR: 94.57 mL/min (ref >60.00)
Glucose, Bld: 103 mg/dL — ABNORMAL HIGH (ref 70–99)
Potassium: 4 mEq/L (ref 3.5–5.1)
SODIUM: 140 meq/L (ref 135–145)
Total Bilirubin: 0.5 mg/dL (ref 0.2–1.2)
Total Protein: 7.6 g/dL (ref 6.0–8.3)

## 2018-12-13 MED ORDER — ONDANSETRON HCL 4 MG PO TABS: 4.0000 mg | ORAL_TABLET | Freq: Four times a day (QID) | ORAL | 1 refills | Status: DC | PRN

## 2018-12-13 NOTE — Telephone Encounter (Signed)
Pt. Called and said that she is more depressed. She is crying all the time. Did not leave house for three days. She called out of work. Her next appt is next Friday 12/22/2018

## 2018-12-13 NOTE — Telephone Encounter (Signed)
Last OV 12/03 next appt 12/22/2018  Please advise

## 2018-12-13 NOTE — Patient Instructions (Signed)
Your provider has requested that you go to the basement level for lab work before leaving today. Press "B" on the elevator. The lab is located at the first door on the left as you exit the elevator.  You have been scheduled for an abdominal ultrasound at [facility] (1st floor of hospital) on 12/13/18 at 3:00 pm. Please arrive 15 minutes prior to your appointment for registration. Make certain not to have anything to eat or drink 6 hours prior to your appointment. Should you need to reschedule your appointment, please contact radiology at [phone]. This test typically takes about 30 minutes to perform.  We have sent the following medications to your pharmacy for you to pick up at your convenience: Zofran 4 mg 1-2 tablets every 6 hours as needed   You will be due for a recall colonoscopy in 2024. We will send you a reminder in the mail when it gets closer to that time.

## 2018-12-13 NOTE — Telephone Encounter (Signed)
Janna from [facility] called stating Boris Lown from [place] accidentally canceled the ultrasound and they need a new order sent in.

## 2018-12-13 NOTE — Progress Notes (Signed)
Referring Provider: Tobe Sos, MD Primary Care Physician:  Tobe Sos, MD   Chief complaint:  diarrhea   IMPRESSION:  Chronic diarrhea - may be multifactorial including hypothyroidism    - testing for celiac negative, giardia    - normal fecal calprotectin    - negative colon and duodenal biopsies Newly diagnosed thyroid dysfunction Reflux (biopsy proven) on EGD 10/19/18    - completed 8 weeks of PPI therapy Tubular adenoma 2019    - surveillance colonoscopy due 2024 Change in bowel habits with diarrhea and intermittent bloating x 1 year    - not responding to trial of probiotics Prior endoscopy in 2012 in [place], [place] History of bleeding hemorrhoids Family history of colon polyps (father in his 87s)   Her chronic diarrhea is likely to be multifactorial. Recent TSH suggests a component of hypothyroidism, and her thyroid is still not fully regulated. Anxiety and depression may be contributing.   Must also consider small intestinal bacterial overgrowth +/- functional diarrhea. Will proceed with SIBO testing. She will start a trial of probiotics after completing the breath test.   Given her new complaints of nausea and weakness, will screen her electrolytes, liver enzymes, and lipase. Obtain an abdominal ultrasound to evaluate the hepatobiliary tree. We've agreed that her GI evaluation will stop if these results are negative, with a switch in focus to symptom management.    PLAN: CMP, CBC with diff, lipase  Abdominal ultrasound (ideally today) SIBO testing if she does not continue to improve Zofran 4mg  SL 1-2 SL q6 hours PRN nausea (#30) Obtain records from [facility], [place], [place] Return to clinic in 1-2 months, or earlier if needed    HPI: Amy Mcintyre is a 53 y.o. female returns at her in follow-up after her last office visit 11/17/18. She was seen at the Urgent Care with nausea and diarrhea. Told she had bacterial overgrowth,  prescribed Zofran, and asked her to follow-up with me.   Currently reports soft, semiformed, brown-yellow stools. Often appears gelatinous. Occasionally will be normal. Frequently having 2-3 each morning. Continues with early morning vomiting. Early morning eructation, as well bloating and flatus after eating.   The week before christmas she experienced worsening diarrhea with associated weakness. Seen in Urgent Care before Christmas. Flu testing was negative. They told her it was a bacteria in her stomach. She was given Zofran. Symptoms resolved.  Recurrent symptoms this last weekend.   Her thyroid medication was recently switched.   Son-in-law was just sent to [place] and she and her daughter are appropriately concerned. She also wonders if menopause is contributing. She identifies stressors at work but doesn't think these are different from normal.   Starting Freshly meals this week.  She is wondering if a probiotic might be helpful.      Past Medical History:  Diagnosis Date  . Anxiety   . Colitis   . Depression   . Eosinophilic esophagitis   . Gastritis   . GERD (gastroesophageal reflux disease)   . Hyperlipidemia   . Thyroid disease   . TMJ (dislocation of temporomandibular joint)     Past Surgical History:  Procedure Laterality Date  . BREAST ENHANCEMENT SURGERY    . CESAREAN SECTION    . tummy tuck    . WRIST SURGERY Left     Current Outpatient Medications  Medication Sig Dispense Refill  . atorvastatin (LIPITOR) 40 MG tablet Take 40 mg by mouth daily.    Marland Kitchen  cholecalciferol (VITAMIN D) 1000 units tablet Take 1,000 Units by mouth daily.    . clonazePAM (KLONOPIN) 1 MG tablet Take 1/2-1 tab po BID prn panic or insomnia 60 tablet 2  . levothyroxine (SYNTHROID, LEVOTHROID) 75 MCG tablet Take 75 mcg by mouth daily.     . meloxicam (MOBIC) 15 MG tablet Take 15 mg by mouth daily as needed for pain.    . pantoprazole (PROTONIX) 40 MG tablet Take 1 tablet (40 mg total) by mouth  daily. 90 tablet 3  . ondansetron (ZOFRAN) 4 MG tablet Take 1 tablet (4 mg total) by mouth every 6 (six) hours as needed for nausea or vomiting. 30 tablet 1  . sertraline (ZOLOFT) 100 MG tablet Take 2 tablets (200 mg total) by mouth daily. 60 tablet 4   No current facility-administered medications for this visit.     Allergies as of 12/13/2018 - Review Complete 12/13/2018  Allergen Reaction Noted  . Ambien [zolpidem tartrate]  09/22/2018  . Erythromycin Swelling 09/22/2018  . Penicillins Swelling 09/22/2018    Family History  Problem Relation Age of Onset  . Anxiety disorder Mother   . Panic disorder Mother   . OCD Mother   . Anxiety disorder Father   . Depression Father   . Colon polyps Father   . Depression Maternal Grandfather   . Depression Paternal Grandmother   . Drug abuse Brother   . Colon cancer Neg Hx   . Esophageal cancer Neg Hx   . Pancreatic cancer Neg Hx   . Stomach cancer Neg Hx   . Liver disease Neg Hx   . Rectal cancer Neg Hx     Social History   Socioeconomic History  . Marital status: Divorced    Spouse name: Not on file  . Number of children: Not on file  . Years of education: Not on file  . Highest education level: Not on file  Occupational History  . Not on file  Social Needs  . Financial resource strain: Not on file  . Food insecurity:    Worry: Not on file    Inability: Not on file  . Transportation needs:    Medical: Not on file    Non-medical: Not on file  Tobacco Use  . Smoking status: Never Smoker  . Smokeless tobacco: Never Used  Substance and Sexual Activity  . Alcohol use: Yes    Comment: socially  . Drug use: Never  . Sexual activity: Not on file  Lifestyle  . Physical activity:    Days per week: Not on file    Minutes per session: Not on file  . Stress: Not on file  Relationships  . Social connections:    Talks on phone: Not on file    Gets together: Not on file    Attends religious service: Not on file    Active  member of club or organization: Not on file    Attends meetings of clubs or organizations: Not on file    Relationship status: Not on file  . Intimate partner violence:    Fear of current or ex partner: Not on file    Emotionally abused: Not on file    Physically abused: Not on file    Forced sexual activity: Not on file  Other Topics Concern  . Not on file  Social History Narrative  . Not on file    [facility] Weights   12/13/18 1018  Weight: 136 lb 12.8 oz (62.1 kg)  Physical Exam: Vital signs were reviewed. General:   Alert, well-nourished, pleasant and cooperative in NAD. Appears her stated age.  Head:  Normocephalic and atraumatic. Lungs:  Clear throughout to auscultation.   No wheezes. Heart:  Regular rate and rhythm; no murmurs Abdomen:  Soft, nontender, normal bowel sounds. No rebound or guarding. No hepatosplenomegaly Msk:  Symmetrical without gross deformities. Extremities:  No gross deformities or edema. Neurologic:  Alert and  oriented x4;  grossly nonfocal Skin:  No rash or bruise. Psych:  Alert and cooperative. Normal mood. Flat affect.   Lillyth Spong L. Tarri Glenn, MD, MPH [facility] 12/13/2018, 11:31 AM

## 2018-12-14 ENCOUNTER — Telehealth: Payer: Self-pay | Admitting: Gastroenterology

## 2018-12-14 NOTE — Telephone Encounter (Signed)
See results notes for additional details on [place]

## 2018-12-14 NOTE — Telephone Encounter (Signed)
Has appt tomorrow at 10 am.

## 2018-12-15 ENCOUNTER — Ambulatory Visit: Payer: PRIVATE HEALTH INSURANCE | Admitting: Psychiatry

## 2018-12-15 ENCOUNTER — Encounter: Payer: Self-pay | Admitting: Psychiatry

## 2018-12-15 VITALS — BP 125/81 | HR 89

## 2018-12-15 DIAGNOSIS — F5101 Primary insomnia: Secondary | ICD-10-CM | POA: Diagnosis not present

## 2018-12-15 DIAGNOSIS — F331 Major depressive disorder, recurrent, moderate: Secondary | ICD-10-CM

## 2018-12-15 DIAGNOSIS — F411 Generalized anxiety disorder: Secondary | ICD-10-CM | POA: Diagnosis not present

## 2018-12-15 MED ORDER — ONDANSETRON 4 MG PO TBDP: 4.0000 mg | ORAL_TABLET | Freq: Four times a day (QID) | ORAL | 1 refills | Status: DC | PRN

## 2018-12-15 MED ORDER — BREXPIPRAZOLE 1 MG PO TABS: 1.0000 mg | ORAL_TABLET | Freq: Every day | ORAL | 0 refills | Status: DC

## 2018-12-15 NOTE — Progress Notes (Signed)
Amy Mcintyre [id number] 11-15-1966 53 y.o.  Subjective:   Patient ID:  Amy Mcintyre is a 53 y.o. (DOB [birth year]) female.  Chief Complaint:  Chief Complaint  Patient presents with  . Depression  . Anxiety  . Insomnia    HPI Amy Mcintyre presents to the office today for follow-up of acute worsening in depression, anxiety, and insomnia.  She reports that her therapist and coworkers have noticed that she is slower and her eyes are lower and appears depressed. She reports that she has significant depression "even though I have no reason to be, everything is going alright." She reports that she frequently feels on the verge of tears. She reports that her mood cycles with periods of increased depression. Low motivation, low energy, "no interest in anything." She reports that she is having to push herself to get through getting to work and getting through the work day. She reports poor sleep the last few days despite taking Klonopin. She reports difficulty making decisions and feels overwhelmed by conflicting information. Reports that coworkers that she trusts have been helping her prioritize things at work. She reports being overwhelmed by daily tasks, such as cleaning her home and going to get groceries. Denies SI. "I don't want to die but I don't want to feel this way.   She reports that she missed a week in December due to severe vomiting and diarrhea and went to urgent care and received IV fluids. Reports that she saw medical provider re: GI s/s. Had ultrasound and blood work. Was told she has mild fatty liver. She reports that she has been researching GI issues on the internet that is triggering increased anxiety. She reports that she has lost 5 lbs and has lost her appetite due to fear of GI s/s. She reports that panic attacks have improved with Klonopin.   Reports that her mother had severe anxiety when she went through menopause. Son-in-law has been deployed to [place] and daughter is having some  anxiety s/s. Pt reports that she wants to support daughter.  Past Psychiatric Medication Trials: Paxil Prozac Sertraline Nortriptyline Doxepin BuSpar Abilify-increased heart rate Seroquel-adverse effects Gabapentin Lunesta-not effective Ambien- parasomnias Trazodone-ineffective Belsomra-worsening insomnia Klonopin  Review of Systems:  Review of Systems  Gastrointestinal: Positive for diarrhea, nausea and vomiting.       Frequent loose stools, belching, flatulence  Musculoskeletal: Negative for gait problem.  Neurological: Negative for tremors.  Psychiatric/Behavioral:       Please refer to HPI    Medications: I have reviewed the patient's current medications.  Current Outpatient Medications  Medication Sig Dispense Refill  . atorvastatin (LIPITOR) 40 MG tablet Take 40 mg by mouth daily.    . baclofen (LIORESAL) 10 MG tablet Take 20 mg by mouth 3 (three) times daily as needed for muscle spasms.    . cholecalciferol (VITAMIN D) 1000 units tablet Take 1,000 Units by mouth daily.    . clonazePAM (KLONOPIN) 1 MG tablet Take 1/2-1 tab po BID prn panic or insomnia 60 tablet 2  . L-Methylfolate-Algae (DEPLIN 15) 15-90.314 MG CAPS Take by mouth.    . levothyroxine (SYNTHROID, LEVOTHROID) 75 MCG tablet Take 75 mcg by mouth daily.     . ondansetron (ZOFRAN) 4 MG tablet Take 1 tablet (4 mg total) by mouth every 6 (six) hours as needed for nausea or vomiting. 30 tablet 1  . pantoprazole (PROTONIX) 40 MG tablet Take 1 tablet (40 mg total) by mouth daily. 90 tablet 3  . Brexpiprazole (REXULTI) 1  MG TABS Take 1 tablet (1 mg total) by mouth daily for 30 days. 30 tablet 0  . meloxicam (MOBIC) 15 MG tablet Take 15 mg by mouth daily as needed for pain.    Marland Kitchen ondansetron (ZOFRAN ODT) 4 MG disintegrating tablet Take 1 tablet (4 mg total) by mouth every 6 (six) hours as needed for nausea or vomiting. 20 tablet 1  . sertraline (ZOLOFT) 100 MG tablet Take 2 tablets (200 mg total) by mouth daily. 60  tablet 4   No current facility-administered medications for this visit.     Medication Side Effects: None  Allergies:  Allergies  Allergen Reactions  . Ambien [Zolpidem Tartrate]   . Erythromycin Swelling  . Penicillins Swelling    Past Medical History:  Diagnosis Date  . Anxiety   . Colitis   . Depression   . Eosinophilic esophagitis   . Gastritis   . GERD (gastroesophageal reflux disease)   . Hyperlipidemia   . Thyroid disease   . TMJ (dislocation of temporomandibular joint)     Family History  Problem Relation Age of Onset  . Anxiety disorder Mother   . Panic disorder Mother   . OCD Mother   . Anxiety disorder Father   . Depression Father   . Colon polyps Father   . Depression Maternal Grandfather   . Depression Paternal Grandmother   . Drug abuse Brother   . Colon cancer Neg Hx   . Esophageal cancer Neg Hx   . Pancreatic cancer Neg Hx   . Stomach cancer Neg Hx   . Liver disease Neg Hx   . Rectal cancer Neg Hx     Social History   Socioeconomic History  . Marital status: Divorced    Spouse name: Not on file  . Number of children: Not on file  . Years of education: Not on file  . Highest education level: Not on file  Occupational History  . Not on file  Social Needs  . Financial resource strain: Not on file  . Food insecurity:    Worry: Not on file    Inability: Not on file  . Transportation needs:    Medical: Not on file    Non-medical: Not on file  Tobacco Use  . Smoking status: Never Smoker  . Smokeless tobacco: Never Used  Substance and Sexual Activity  . Alcohol use: Yes    Comment: socially  . Drug use: Never  . Sexual activity: Not on file  Lifestyle  . Physical activity:    Days per week: Not on file    Minutes per session: Not on file  . Stress: Not on file  Relationships  . Social connections:    Talks on phone: Not on file    Gets together: Not on file    Attends religious service: Not on file    Active member of club or  organization: Not on file    Attends meetings of clubs or organizations: Not on file    Relationship status: Not on file  . Intimate partner violence:    Fear of current or ex partner: Not on file    Emotionally abused: Not on file    Physically abused: Not on file    Forced sexual activity: Not on file  Other Topics Concern  . Not on file  Social History Narrative  . Not on file    Past Medical History, Surgical history, Social history, and Family history were reviewed and updated as appropriate.  Please see review of systems for further details on the patient's review from today.   Objective:   Physical Exam:  BP 125/81   Pulse 89   Physical Exam Constitutional:      General: She is not in acute distress.    Appearance: She is well-developed.  Musculoskeletal:        General: No deformity.  Neurological:     Mental Status: She is alert and oriented to person, place, and time.     Coordination: Coordination normal.  Psychiatric:        Mood and Affect: Mood is anxious and depressed. Affect is tearful. Affect is not labile, blunt, angry or inappropriate.        Speech: Speech normal.        Behavior: Behavior is slowed.        Thought Content: Thought content normal. Thought content does not include homicidal or suicidal ideation. Thought content does not include homicidal or suicidal plan.        Judgment: Judgment normal.     Comments: Insight intact. No auditory or visual hallucinations. No delusions.      Lab Review:     Component Value Date/Time   NA 140 12/13/2018 1158   K 4.0 12/13/2018 1158   CL 102 12/13/2018 1158   CO2 29 12/13/2018 1158   GLUCOSE 103 (H) 12/13/2018 1158   BUN 23 12/13/2018 1158   CREATININE 0.69 12/13/2018 1158   CALCIUM 10.0 12/13/2018 1158   PROT 7.6 12/13/2018 1158   ALBUMIN 4.7 12/13/2018 1158   AST 22 12/13/2018 1158   ALT 29 12/13/2018 1158   ALKPHOS 82 12/13/2018 1158   BILITOT 0.5 12/13/2018 1158       Component  Value Date/Time   WBC 4.6 12/13/2018 1158   RBC 4.47 12/13/2018 1158   HGB 13.5 12/13/2018 1158   HCT 40.6 12/13/2018 1158   PLT 258.0 12/13/2018 1158   MCV 90.6 12/13/2018 1158   MCHC 33.3 12/13/2018 1158   RDW 13.7 12/13/2018 1158   LYMPHSABS 1.4 12/13/2018 1158   MONOABS 0.4 12/13/2018 1158   EOSABS 0.0 12/13/2018 1158   BASOSABS 0.0 12/13/2018 1158    No results found for: POCLITH, LITHIUM   No results found for: PHENYTOIN, PHENOBARB, VALPROATE, CBMZ   .res Assessment: Plan:   Discussed potential benefits, risk, and side effects of Rexulti for augmentation of depression and anxiety. (Pt had increased heart rate with Abilify in the past and was not able to tolerate it). Discussed potential metabolic side effects associated with atypical antipsychotics, as well as potential risk for movement side effects. Advised pt to contact office if movement side effects occur.  Will start Rexulti 0.5 mg daily for 1 week, then increase to 1 mg daily. Continue sertraline 200 mg daily for mood and anxiety. Continue Deplin 15 mg p.o. daily until samples are completed. Continue Klonopin as needed. Advised patient to contact office if worsening signs and symptoms occur. Moderate episode of recurrent major depressive disorder (HCC) - Chronic with acute exacerbation - Plan: Brexpiprazole (REXULTI) 1 MG TABS  Generalized anxiety disorder - Chronic with acute exacerbation  Primary insomnia - Chronic with acute exacerbation  Please see After Visit Summary for patient specific instructions.  Future Appointments  Date Time Provider [place]  12/22/2018  3:15 PM Thayer Headings, PMHNP [facility] None  01/12/2019  9:30 AM Thayer Headings, PMHNP [facility] None    No orders of the defined types were placed in this encounter.     -------------------------------

## 2018-12-15 NOTE — Addendum Note (Signed)
Addended by: Wyline Beady on: 12/15/2018 11:14 AM   Modules accepted: Orders

## 2018-12-20 ENCOUNTER — Ambulatory Visit: Payer: PRIVATE HEALTH INSURANCE | Admitting: Gastroenterology

## 2018-12-22 ENCOUNTER — Ambulatory Visit: Payer: PRIVATE HEALTH INSURANCE | Admitting: Psychiatry

## 2018-12-22 ENCOUNTER — Telehealth: Payer: Self-pay | Admitting: Psychiatry

## 2018-12-22 NOTE — Telephone Encounter (Signed)
Yes PA is being submitted

## 2018-12-22 NOTE — Telephone Encounter (Signed)
Pt calling stating the pharmacy said her Rexulti PA was denied. She wants to check with [place] to make sure we are working on getting it approved for her. Please advise her of progress. She has med for now.

## 2018-12-26 ENCOUNTER — Telehealth: Payer: Self-pay

## 2018-12-26 NOTE — Telephone Encounter (Signed)
Spoke with [employer] Member ID [id number] and there is NO PA required for [place], made Cameron aware of information. Insurance stated pharmacy must submit a "cost override" instead. Pharmacy then faxed a form from [employer] ([employer]) to fill out. Form completed and faxed to [facility] [phone]

## 2019-01-02 ENCOUNTER — Telehealth: Payer: Self-pay

## 2019-01-02 NOTE — Telephone Encounter (Signed)
Prior authorization approved on Rexulti 1mg  per [employer] ([employer]) [phone] fax [phone] Effective till 12/27/2019 [place] [id number] phone

## 2019-01-08 ENCOUNTER — Telehealth: Payer: Self-pay | Admitting: Gastroenterology

## 2019-01-08 NOTE — Telephone Encounter (Signed)
Referral faxed

## 2019-01-12 ENCOUNTER — Ambulatory Visit (INDEPENDENT_AMBULATORY_CARE_PROVIDER_SITE_OTHER): Payer: PRIVATE HEALTH INSURANCE | Admitting: Psychiatry

## 2019-01-12 ENCOUNTER — Encounter: Payer: Self-pay | Admitting: Psychiatry

## 2019-01-12 VITALS — BP 122/67 | HR 85

## 2019-01-12 DIAGNOSIS — F411 Generalized anxiety disorder: Secondary | ICD-10-CM | POA: Diagnosis not present

## 2019-01-12 DIAGNOSIS — F331 Major depressive disorder, recurrent, moderate: Secondary | ICD-10-CM

## 2019-01-12 DIAGNOSIS — F5101 Primary insomnia: Secondary | ICD-10-CM | POA: Diagnosis not present

## 2019-01-12 MED ORDER — CLONAZEPAM 1 MG PO TABS: ORAL_TABLET | ORAL | 2 refills | Status: DC

## 2019-01-12 MED ORDER — BREXPIPRAZOLE 1 MG PO TABS: 1.0000 mg | ORAL_TABLET | Freq: Every day | ORAL | 1 refills | Status: DC

## 2019-01-12 MED ORDER — BREXPIPRAZOLE 1 MG PO TABS: 1.0000 mg | ORAL_TABLET | Freq: Every day | ORAL | 0 refills | Status: DC

## 2019-01-12 NOTE — Progress Notes (Signed)
Amy Mcintyre [id number] 1966/08/08 53 y.o.  Subjective:   Patient ID:  Amy Mcintyre is a 53 y.o. (DOB [birth year]) female.  Chief Complaint:  Chief Complaint  Patient presents with  . Follow-up    Depression and anxiety    HPI [name] presents to the office today for follow-up of depression, anxiety, and insomnia. She reports "I am 100% better than the last time I was here." She reports that she has not been having panic attacks and uncontrolled crying. No longer having vomiting and diarrhea. Denies any recent missed work. She reports that she is not taking Klonopin as often. Reports occasional "low moments... but nothing like it was" in terms of depression. She reports having some generalized anxiety and also some worry about if/when exacerbation of mood and anxiety may return.  She reports that her sleep conitnues to be "hot or miss" and is interrupted by night sweats and then has difficulty returning to sleep. Estimates sleeping 5-6 hours most nights. She reports that her appetite has increased and she has gained 5 lbs since last visit. Energy and motivation have improved. Reports that she has been most interested in things and went to [place] with some people with work and has started yoga. She reports adequate concentration. Denies SI.   Continues to see Rodena Goldmann, LPC. Reports that she continues to spend time with ex-husband and this is going well. Going to see her daughter this weekend.    Past Psychiatric Medication Trials: Paxil Prozac Sertraline Nortriptyline Doxepin BuSpar Abilify-increased heart rate Seroquel-adverse effects Gabapentin Lunesta-not effective Ambien- parasomnias Trazodone-ineffective Belsomra-worsening insomnia Klonopin  Review of Systems:  Review of Systems  Gastrointestinal: Negative for diarrhea, nausea and vomiting.  Musculoskeletal: Negative for gait problem.       TMJ pain  Neurological: Negative for tremors.  Psychiatric/Behavioral:        Please refer to HPI    Medications: I have reviewed the patient's current medications.  Current Outpatient Medications  Medication Sig Dispense Refill  . atorvastatin (LIPITOR) 40 MG tablet Take 40 mg by mouth daily.    . baclofen (LIORESAL) 10 MG tablet Take 20 mg by mouth 3 (three) times daily as needed for muscle spasms.    . Brexpiprazole (REXULTI) 1 MG TABS Take 1 tablet (1 mg total) by mouth daily. 90 tablet 0  . cholecalciferol (VITAMIN D) 1000 units tablet Take 1,000 Units by mouth daily.    Derrill Memo ON 03/03/2019] clonazePAM (KLONOPIN) 1 MG tablet Take 1/2-1 tab po BID prn panic or insomnia 60 tablet 2  . Lactobacillus (PROBIOTIC ACIDOPHILUS PO) Take by mouth.    . levothyroxine (SYNTHROID, LEVOTHROID) 75 MCG tablet Take 75 mcg by mouth daily.     . meloxicam (MOBIC) 15 MG tablet Take 15 mg by mouth daily as needed for pain.    . pantoprazole (PROTONIX) 40 MG tablet Take 1 tablet (40 mg total) by mouth daily. 90 tablet 3  . ondansetron (ZOFRAN ODT) 4 MG disintegrating tablet Take 1 tablet (4 mg total) by mouth every 6 (six) hours as needed for nausea or vomiting. (Patient not taking: Reported on 01/12/2019) 20 tablet 1  . ondansetron (ZOFRAN) 4 MG tablet Take 1 tablet (4 mg total) by mouth every 6 (six) hours as needed for nausea or vomiting. (Patient not taking: Reported on 01/12/2019) 30 tablet 1  . sertraline (ZOLOFT) 100 MG tablet Take 2 tablets (200 mg total) by mouth daily. 60 tablet 4   No current facility-administered medications for this  visit.     Medication Side Effects: None  Allergies:  Allergies  Allergen Reactions  . Ambien [Zolpidem Tartrate]   . Erythromycin Swelling  . Penicillins Swelling    Past Medical History:  Diagnosis Date  . Anxiety   . Colitis   . Depression   . Eosinophilic esophagitis   . Gastritis   . GERD (gastroesophageal reflux disease)   . Hyperlipidemia   . Thyroid disease   . TMJ (dislocation of temporomandibular joint)     Family  History  Problem Relation Age of Onset  . Anxiety disorder Mother   . Panic disorder Mother   . OCD Mother   . Anxiety disorder Father   . Depression Father   . Colon polyps Father   . Depression Maternal Grandfather   . Depression Paternal Grandmother   . Drug abuse Brother   . Colon cancer Neg Hx   . Esophageal cancer Neg Hx   . Pancreatic cancer Neg Hx   . Stomach cancer Neg Hx   . Liver disease Neg Hx   . Rectal cancer Neg Hx     Social History   Socioeconomic History  . Marital status: Divorced    Spouse name: Not on file  . Number of children: Not on file  . Years of education: Not on file  . Highest education level: Not on file  Occupational History  . Not on file  Social Needs  . Financial resource strain: Not on file  . Food insecurity:    Worry: Not on file    Inability: Not on file  . Transportation needs:    Medical: Not on file    Non-medical: Not on file  Tobacco Use  . Smoking status: Never Smoker  . Smokeless tobacco: Never Used  Substance and Sexual Activity  . Alcohol use: Yes    Comment: socially  . Drug use: Never  . Sexual activity: Not on file  Lifestyle  . Physical activity:    Days per week: Not on file    Minutes per session: Not on file  . Stress: Not on file  Relationships  . Social connections:    Talks on phone: Not on file    Gets together: Not on file    Attends religious service: Not on file    Active member of club or organization: Not on file    Attends meetings of clubs or organizations: Not on file    Relationship status: Not on file  . Intimate partner violence:    Fear of current or ex partner: Not on file    Emotionally abused: Not on file    Physically abused: Not on file    Forced sexual activity: Not on file  Other Topics Concern  . Not on file  Social History Narrative  . Not on file    Past Medical History, Surgical history, Social history, and Family history were reviewed and updated as appropriate.    Please see review of systems for further details on the patient's review from today.   Objective:   Physical Exam:  BP 122/67   Pulse 85   Physical Exam Constitutional:      General: She is not in acute distress.    Appearance: She is well-developed.  Musculoskeletal:        General: No deformity.  Neurological:     Mental Status: She is alert and oriented to person, place, and time.     Coordination: Coordination normal.  Psychiatric:  Attention and Perception: Attention and perception normal. She does not perceive auditory or visual hallucinations.        Mood and Affect: Mood normal. Mood is not anxious or depressed. Affect is not labile, blunt, angry or inappropriate.        Speech: Speech normal.        Behavior: Behavior normal.        Thought Content: Thought content normal. Thought content does not include homicidal or suicidal ideation. Thought content does not include homicidal or suicidal plan.        Cognition and Memory: Cognition and memory normal.        Judgment: Judgment normal.     Comments: Insight intact. No delusions.  AIMS=0     Lab Review:     Component Value Date/Time   NA 140 12/13/2018 1158   K 4.0 12/13/2018 1158   CL 102 12/13/2018 1158   CO2 29 12/13/2018 1158   GLUCOSE 103 (H) 12/13/2018 1158   BUN 23 12/13/2018 1158   CREATININE 0.69 12/13/2018 1158   CALCIUM 10.0 12/13/2018 1158   PROT 7.6 12/13/2018 1158   ALBUMIN 4.7 12/13/2018 1158   AST 22 12/13/2018 1158   ALT 29 12/13/2018 1158   ALKPHOS 82 12/13/2018 1158   BILITOT 0.5 12/13/2018 1158       Component Value Date/Time   WBC 4.6 12/13/2018 1158   RBC 4.47 12/13/2018 1158   HGB 13.5 12/13/2018 1158   HCT 40.6 12/13/2018 1158   PLT 258.0 12/13/2018 1158   MCV 90.6 12/13/2018 1158   MCHC 33.3 12/13/2018 1158   RDW 13.7 12/13/2018 1158   LYMPHSABS 1.4 12/13/2018 1158   MONOABS 0.4 12/13/2018 1158   EOSABS 0.0 12/13/2018 1158   BASOSABS 0.0 12/13/2018 1158     No results found for: POCLITH, LITHIUM   No results found for: PHENYTOIN, PHENOBARB, VALPROATE, CBMZ   .res Assessment: Plan:   Will continue current medications since mood, anxiety, and insomnia s/s are well controlled.  Continue Rexulti1 mg po qd for depression and anxiety.  Continue Sertraline 200 mg po qd for depression and anxiety.  Continue Klonopin 1 mg 1/2-1 tab po BID prn panic or anxiety.   Recommend continuing to see Rodena Goldmann, [facility] for therapy. 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  Future Appointments  Date Time Provider [place]  02/14/2019  8:30 AM Thornton Park, MD [facility]  04/19/2019  5:00 PM Thayer Headings, PMHNP [facility] None    No orders of the defined types were placed in this encounter.     -------------------------------

## 2019-01-29 ENCOUNTER — Telehealth: Payer: Self-pay | Admitting: Gastroenterology

## 2019-01-29 NOTE — Telephone Encounter (Signed)
Patient notified that I contacted [facility] and am waiting for them to send the results.  She is aware that I will notify her once we see the results.

## 2019-01-29 NOTE — Telephone Encounter (Signed)
I have not yet seen the results.

## 2019-01-29 NOTE — Telephone Encounter (Signed)
Results are printed and  in your inbasket

## 2019-01-29 NOTE — Telephone Encounter (Signed)
Pt called inquiring about SIBO test results, she stated that lab received test on 1/29.

## 2019-01-29 NOTE — Telephone Encounter (Signed)
Have you seen the results?

## 2019-01-30 NOTE — Telephone Encounter (Signed)
Left message for patient to call back  

## 2019-01-30 NOTE — Telephone Encounter (Signed)
The breath test shows no evidence for bacterial overgrowth.  I would like her to continue with daily probiotics at this time.  No additional new recommendations.  Thank you.

## 2019-01-30 NOTE — Telephone Encounter (Signed)
Patient called and left a vm asking I leave the results on her VM.  She is in a meeting with no phone service.  Results left on her VM.  She is asked to call back for any additional questions or concerns.

## 2019-02-14 ENCOUNTER — Ambulatory Visit: Payer: PRIVATE HEALTH INSURANCE | Admitting: Gastroenterology

## 2019-02-24 ENCOUNTER — Other Ambulatory Visit: Payer: Self-pay | Admitting: Psychiatry

## 2019-02-24 DIAGNOSIS — F331 Major depressive disorder, recurrent, moderate: Secondary | ICD-10-CM

## 2019-02-27 ENCOUNTER — Other Ambulatory Visit: Payer: Self-pay | Admitting: Psychiatry

## 2019-02-27 DIAGNOSIS — F411 Generalized anxiety disorder: Secondary | ICD-10-CM

## 2019-02-27 DIAGNOSIS — F331 Major depressive disorder, recurrent, moderate: Secondary | ICD-10-CM

## 2019-03-09 ENCOUNTER — Encounter: Payer: Self-pay | Admitting: Gastroenterology

## 2019-03-09 ENCOUNTER — Other Ambulatory Visit: Payer: Self-pay

## 2019-03-09 ENCOUNTER — Ambulatory Visit (INDEPENDENT_AMBULATORY_CARE_PROVIDER_SITE_OTHER): Payer: PRIVATE HEALTH INSURANCE | Admitting: Gastroenterology

## 2019-03-09 DIAGNOSIS — R932 Abnormal findings on diagnostic imaging of liver and biliary tract: Secondary | ICD-10-CM

## 2019-03-09 DIAGNOSIS — R197 Diarrhea, unspecified: Secondary | ICD-10-CM | POA: Diagnosis not present

## 2019-03-09 NOTE — Patient Instructions (Signed)
I am so glad that you are feeling better!  I have no new recommendations today.  Let's plan another appointment in November 2020.  Thank you for your patience with me and our technology today! Please stay home, safe, and healthy.

## 2019-03-09 NOTE — Progress Notes (Addendum)
TELEHEALTH VISIT  Referring Provider: Tobe Sos, MD Primary Care Physician:  Tobe Sos, MD   Tele-visit due to COVID-19 pandemic Patient requested visit virtually, consented to the virtual encounter via WebEx Contact made at: 9:35 03/09/19 Patient verified by name and date of birth Location of patient: Home Location provider: My medical office Names of persons participating: Me, patient, Quintin Alto CMA Time spent on telehealth visit: 20 minutes  Chief complaint:  Diarrhea   IMPRESSION:  Chronic diarrhea with intermittent bloating x 1 year    - due hypothyroidism    - testing for celiac negative, giardia    - normal fecal calprotectin    - negative colon and duodenal biopsies    - not improved by probiotics    - breath test negative for SIBO by hydrogen or methane    - symptoms resolved with regulation of thyroid Newly diagnosed thyroid dysfunction, now improved Hepatic steatosis on ultrasound 12/13/18 with ALT 29 Reflux (biopsy proven) on EGD 10/19/18    - completed 8 weeks of PPI therapy Tubular adenoma 2019    - surveillance colonoscopy due 2024 Prior endoscopy in 2012 in [place], [place] History of bleeding hemorrhoids Family history of colon polyps (father in his 34s)   Diarrhea has resolved with normalization of thyroid function. No additional evaluation or treatment recommended at this time.  Reviewed the results of her SIBO testing. There is no suspicion for bacterial overgrowth. Reassurance provided.   No ongoing reflux after completing 8 weeks of PPI therapy.   Review ultrasound findings and liver enzymes from January. ALT normal at 29.  No evidence for advance fibrosis or portal hypertension on my personal review of the ultrasound images. No additional work-up needed at this time.    PLAN: Follow-up in November 2020, earlier as needed  HPI: Amy Mcintyre is a 53 y.o. female with ongoing diarrhea.  She was last seen 12/13/18 with ongoing diarrhea.   She had small intestinal bacterial overgrowth testing 12/31/2018 that shows no suspicion for bacterial overgrowth.  The increase in hydrogen was 3 ppm.  Increase in methane to PPM.  Increase in combined hydrogen and methane 5 ppm.  Thryoid is now regulated and her GI symptoms have largely improved. Panic disorders and crying spells are even better.  Having one formed bowel movement daily. Occasionally loose. No diarrhea.   She is asking about the hepatic steatosis mentioned on ultrasound 12/2018.   Labs 12/13/18: AST 22, ALT 29, alk phos 82, TB 0.5, alb 4.7, platelets 258.   No new complaints or concerns at this time.   Past Medical History:  Diagnosis Date  . Anxiety   . Colitis   . Depression   . Eosinophilic esophagitis   . Gastritis   . GERD (gastroesophageal reflux disease)   . Hyperlipidemia   . Thyroid disease   . TMJ (dislocation of temporomandibular joint)     Past Surgical History:  Procedure Laterality Date  . BREAST ENHANCEMENT SURGERY    . CESAREAN SECTION    . NASAL SINUS SURGERY    . tummy tuck    . WRIST SURGERY Left     Current Outpatient Medications  Medication Sig Dispense Refill  . atorvastatin (LIPITOR) 40 MG tablet Take 40 mg by mouth daily.    . cholecalciferol (VITAMIN D) 1000 units tablet Take 1,000 Units by mouth daily.    . clindamycin (CLEOCIN) 150 MG capsule Take 1 capsule by mouth every 8 (eight) hours.    Marland Kitchen  clonazePAM (KLONOPIN) 1 MG tablet Take 1/2-1 tab po BID prn panic or insomnia 60 tablet 2  . cyclobenzaprine (FLEXERIL) 10 MG tablet Take 1 tablet by mouth at bedtime as needed.    . Lactobacillus (PROBIOTIC ACIDOPHILUS PO) Take by mouth.    . levocetirizine (XYZAL) 5 MG tablet Take 5 mg by mouth every evening.    Marland Kitchen levothyroxine (SYNTHROID, LEVOTHROID) 75 MCG tablet Take 75 mcg by mouth daily.     . meloxicam (MOBIC) 7.5 MG tablet Take 7.5 mg by mouth 2 (two) times daily as needed.    . pantoprazole (PROTONIX) 40 MG tablet Take 1 tablet (40  mg total) by mouth daily. 90 tablet 3  . REXULTI 1 MG TABS Take 1 tablet by mouth once daily 30 tablet 2  . sertraline (ZOLOFT) 100 MG tablet Take 2 tablets by mouth once daily 60 tablet 2  . ondansetron (ZOFRAN ODT) 4 MG disintegrating tablet Take 1 tablet (4 mg total) by mouth every 6 (six) hours as needed for nausea or vomiting. (Patient not taking: Reported on 01/12/2019) 20 tablet 1   No current facility-administered medications for this visit.     Allergies as of 03/09/2019 - Review Complete 03/09/2019  Allergen Reaction Noted  . Ambien [zolpidem tartrate]  09/22/2018  . Erythromycin Swelling 09/22/2018  . Penicillins Swelling 09/22/2018    Family History  Problem Relation Age of Onset  . Anxiety disorder Mother   . Panic disorder Mother   . OCD Mother   . Anxiety disorder Father   . Depression Father   . Colon polyps Father   . Depression Maternal Grandfather   . Depression Paternal Grandmother   . Drug abuse Brother   . Colon cancer Neg Hx   . Esophageal cancer Neg Hx   . Pancreatic cancer Neg Hx   . Stomach cancer Neg Hx   . Liver disease Neg Hx   . Rectal cancer Neg Hx     Social History   Socioeconomic History  . Marital status: Divorced    Spouse name: Not on file  . Number of children: Not on file  . Years of education: Not on file  . Highest education level: Not on file  Occupational History  . Not on file  Social Needs  . Financial resource strain: Not on file  . Food insecurity:    Worry: Not on file    Inability: Not on file  . Transportation needs:    Medical: Not on file    Non-medical: Not on file  Tobacco Use  . Smoking status: Never Smoker  . Smokeless tobacco: Never Used  Substance and Sexual Activity  . Alcohol use: Yes    Comment: socially  . Drug use: Never  . Sexual activity: Not on file  Lifestyle  . Physical activity:    Days per week: Not on file    Minutes per session: Not on file  . Stress: Not on file  Relationships  .  Social connections:    Talks on phone: Not on file    Gets together: Not on file    Attends religious service: Not on file    Active member of club or organization: Not on file    Attends meetings of clubs or organizations: Not on file    Relationship status: Not on file  . Intimate partner violence:    Fear of current or ex partner: Not on file    Emotionally abused: Not on file  Physically abused: Not on file    Forced sexual activity: Not on file  Other Topics Concern  . Not on file  Social History Narrative  . Not on file    Review of Systems: ALL ROS discussed and all others negative except listed in HPI.  Physical Exam: General: in no acute distress. Appears more relaxed since our last visit.  Neuro: Alert and appropriate Psych: Normal affect and normal insight Exam otherwise limited due to [employer] Tarri Glenn, MD, MPH [facility] 03/09/2019, 9:22 AM

## 2019-03-30 ENCOUNTER — Ambulatory Visit: Payer: PRIVATE HEALTH INSURANCE | Admitting: Gastroenterology

## 2019-04-03 ENCOUNTER — Telehealth: Payer: Self-pay | Admitting: Gastroenterology

## 2019-04-03 NOTE — Telephone Encounter (Signed)
Patient reports terrible bloating despite recent recommendations. "My skin stretches until it is going to break".  Patient is offered a virtual visit on 04/06/19 10:00

## 2019-04-06 ENCOUNTER — Encounter: Payer: Self-pay | Admitting: Gastroenterology

## 2019-04-06 ENCOUNTER — Other Ambulatory Visit: Payer: Self-pay

## 2019-04-06 ENCOUNTER — Ambulatory Visit (INDEPENDENT_AMBULATORY_CARE_PROVIDER_SITE_OTHER): Payer: PRIVATE HEALTH INSURANCE | Admitting: Gastroenterology

## 2019-04-06 VITALS — Ht 59.0 in | Wt 148.0 lb

## 2019-04-06 DIAGNOSIS — R932 Abnormal findings on diagnostic imaging of liver and biliary tract: Secondary | ICD-10-CM | POA: Diagnosis not present

## 2019-04-06 DIAGNOSIS — R140 Abdominal distension (gaseous): Secondary | ICD-10-CM | POA: Diagnosis not present

## 2019-04-06 MED ORDER — HYDROCORTISONE ACETATE 25 MG RE SUPP: 25.0000 mg | Freq: Two times a day (BID) | RECTAL | 0 refills | Status: DC

## 2019-04-06 NOTE — Progress Notes (Signed)
TELEHEALTH VISIT  Referring Provider: Tobe Sos, MD Primary Care Physician:  Tobe Sos, MD   Tele-visit due to COVID-19 pandemic Patient requested visit virtually, consented to the virtual encounter via [place] made at: 10:00 04/06/19 Patient verified by name and date of birth Location of patient: Home Location provider: My medical office Names of persons participating: Me, patient, [name] CMA Time spent on telehealth visit: 28 minutes  Chief complaint:  Bloating   IMPRESSION:  Worsened bloating and abdominal distension Chronic diarrhea with intermittent bloating x 1 year    - likely due to hypothyroidism    - testing for celiac negative, giardia    - normal fecal calprotectin    - negative colon and duodenal biopsies    - not improved by probiotics    - breath test negative for SIBO by hydrogen or methane    - symptoms resolved with regulation of thyroid Intermittent rectal bleeding attributed to internal hemorrhoids Newly diagnosed thyroid dysfunction, now improved Hepatic steatosis on ultrasound 12/13/18 with ALT 29 Reflux (biopsy proven) on EGD 10/19/18    - completed 8 weeks of PPI therapy Tubular adenoma 2019    - surveillance colonoscopy due 2024 Family history of colon polyps (father in his 63s)   Diarrhea has resolved with normalization of thyroid function, however, she now has progressive abdominal bloating and distension.   Reviewed colonoscopy results showing internal hemorrhoids. Suspect this is cause of her recent bleeding. Local therapy recommended.   Reviewed ultrasound findings and liver enzymes from January. ALT normal at 29.  No evidence for advance fibrosis or portal hypertension on my personal review of the ultrasound images. Will proceed with labs to exclude concurrent liver disease that may appear like fatty liver on ultrasound.   PLAN: CT abd/pelvis with IV contrast for abdominal bloating/distension Labs to  evaluate fatty liver on ultrasound: ferritin, iron, ANA, IgG, HBsAg, HBcAb Igm, HCV Ab, fasting insulin, fasting glucose, Fibrosure Anusol HC suppositories BID (#28) for hemorrhoidal bleeding Banding discussed - she may call and schedule at any time Follow-up visit after the CT scan to review the results  HPI: Amy Mcintyre is a 53 y.o. female with recent hypthyroid associated diarrhea who requested a telehealth appointment for bloating. "My skin stretches and it's going to break." Feels that she is distended and looks pregnant. It feels hard.  She has had people comment on the changes. She doesn't feel like this is due to a weight gain, although notes that she has gained weight since she started working at home for coronavirus. Notes increased anxiety.  No identified food triggers. Symptoms not worsened with eating. No significant flatus or eructation. No other associated symptoms. No identified exacerbating or relieving features.  One week of diarrhea that resulted in hemorrhoids occurring 10 days. Currently having one or two formed BM daily.  Stools are soft and formed. No other associated symptoms at that time. Used Preparation H in the past for hemorrhoids. Wondering what she should do now.   She had small intestinal bacterial overgrowth testing 12/31/2018 that was negative for bacterial overgrowth.  The increase in hydrogen was 3 ppm.  Increase in methane to PPM.  Increase in combined hydrogen and methane 5 ppm.  Past Medical History:  Diagnosis Date  . Anxiety   . Colitis   . Depression   . Eosinophilic esophagitis   . Gastritis   . GERD (gastroesophageal reflux disease)   . Hyperlipidemia   . Thyroid disease   .  TMJ (dislocation of temporomandibular joint)     Past Surgical History:  Procedure Laterality Date  . BREAST ENHANCEMENT SURGERY    . CESAREAN SECTION    . NASAL SINUS SURGERY    . tummy tuck    . WRIST SURGERY Left     Current Outpatient Medications  Medication Sig  Dispense Refill  . atorvastatin (LIPITOR) 40 MG tablet Take 40 mg by mouth daily.    . baclofen (LIORESAL) 20 MG tablet 20 mg 3 (three) times daily. TAKES BID    . cholecalciferol (VITAMIN D) 1000 units tablet Take 1,000 Units by mouth daily.    . clonazePAM (KLONOPIN) 1 MG tablet Take 1/2-1 tab po BID prn panic or insomnia 60 tablet 2  . Lactobacillus (PROBIOTIC ACIDOPHILUS PO) Take by mouth.    . levothyroxine (SYNTHROID, LEVOTHROID) 75 MCG tablet Take 75 mcg by mouth daily.     . meloxicam (MOBIC) 7.5 MG tablet Take 7.5 mg by mouth 2 (two) times daily as needed.    . pantoprazole (PROTONIX) 40 MG tablet Take 1 tablet (40 mg total) by mouth daily. 90 tablet 3  . REXULTI 1 MG TABS Take 1 tablet by mouth once daily 30 tablet 2  . sertraline (ZOLOFT) 100 MG tablet Take 2 tablets by mouth once daily 60 tablet 2   No current facility-administered medications for this visit.     Allergies as of 04/06/2019 - Review Complete 04/05/2019  Allergen Reaction Noted  . Ambien [zolpidem tartrate]  09/22/2018  . Erythromycin Swelling 09/22/2018  . Penicillins Swelling 09/22/2018    Family History  Problem Relation Age of Onset  . Anxiety disorder Mother   . Panic disorder Mother   . OCD Mother   . Anxiety disorder Father   . Depression Father   . Colon polyps Father   . Depression Maternal Grandfather   . Depression Paternal Grandmother   . Drug abuse Brother   . Colon cancer Neg Hx   . Esophageal cancer Neg Hx   . Pancreatic cancer Neg Hx   . Stomach cancer Neg Hx   . Liver disease Neg Hx   . Rectal cancer Neg Hx     Social History   Socioeconomic History  . Marital status: Not on file    Spouse name: Not on file  . Number of children: 1  . Years of education: Not on file  . Highest education level: Not on file  Occupational History  . Occupation: MANAGER  Social Needs  . Financial resource strain: Not on file  . Food insecurity:    Worry: Not on file    Inability: Not on  file  . Transportation needs:    Medical: Not on file    Non-medical: Not on file  Tobacco Use  . Smoking status: Never Smoker  . Smokeless tobacco: Never Used  Substance and Sexual Activity  . Alcohol use: Yes    Comment: socially  . Drug use: Never  . Sexual activity: Not on file  Lifestyle  . Physical activity:    Days per week: Not on file    Minutes per session: Not on file  . Stress: Not on file  Relationships  . Social connections:    Talks on phone: Not on file    Gets together: Not on file    Attends religious service: Not on file    Active member of club or organization: Not on file    Attends meetings of clubs or organizations:  Not on file    Relationship status: Not on file  . Intimate partner violence:    Fear of current or ex partner: Not on file    Emotionally abused: Not on file    Physically abused: Not on file    Forced sexual activity: Not on file  Other Topics Concern  . Not on file  Social History Narrative  . Not on file    Review of Systems: ALL ROS discussed and all others negative except listed in HPI.  Physical Exam: General: in no acute distress. Appears more relaxed since our last visit.  Neuro: Alert and appropriate Psych: Normal affect and normal insight Exam otherwise limited due to telehealth encounter   Landis. Tarri Glenn, MD, MPH [facility] 04/06/2019, 9:59 AM

## 2019-04-06 NOTE — Patient Instructions (Addendum)
If you are age 54 or older, your body mass index should be between 23-30. Your Body mass index is 29.89 kg/m. If this is out of the aforementioned range listed, please consider follow up with your Primary Care Provider.  If you are age 29 or younger, your body mass index should be between 19-25. Your Body mass index is 29.89 kg/m. If this is out of the aformentioned range listed, please consider follow up with your Primary Care Provider.   I am so sorry to hear that your bloating is worse.  But, I'm thankful that the diarrhea is better  Avoid foods associated with gas and bloating: Carbonated beverages, Caffeinated beverages, sugar substitutes, legumes, onions, cabbage, brussel sprouts, and artificial sweeteners.   I have recommended a CT of the abd/pelvis with contrast.  You have been scheduled for a CT scan of the abdomen and pelvis at [place]. [address]   You are scheduled on 04/18/19 at 9:30 am. You should arrive 20 minutes prior to your appointment time for registration. Please follow the written instructions below on the day of your exam:  WARNING: IF YOU ARE ALLERGIC TO IODINE/X-RAY DYE, PLEASE NOTIFY RADIOLOGY IMMEDIATELY AT [phone]! YOU WILL BE GIVEN A 13 HOUR PREMEDICATION PREP.  1) Do not eat after 5:30 am (4 hours prior to your test) 2) You have been given 2 bottles of oral contrast to drink. The solution may taste better if refrigerated, but do NOT add ice or any other liquid to this solution. Shake well before drinking.    Drink 1 bottle of contrast @ 7:30 am (2 hours prior to your exam)  Drink 1 bottle of contrast @ 8:30 am (1 hour prior to your exam)  Pick up contrast anytime before 04/18/19.  You may take any medications as prescribed with a small amount of water, if necessary. If you take any of the following medications: METFORMIN, GLUCOPHAGE, GLUCOVANCE, AVANDAMET, RIOMET, FORTAMET, [place] MET, JANUMET, GLUMETZA or METAGLIP, you MAY be asked to  HOLD this medication 48 hours AFTER the exam.  The purpose of you drinking the oral contrast is to aid in the visualization of your intestinal tract. The contrast solution may cause some diarrhea. Depending on your individual set of symptoms, you may also receive an intravenous injection of x-ray contrast/dye. Plan on being at [place] for 30 minutes or longer, depending on the type of exam you are having performed.  This test typically takes 30-45 minutes to complete.  If you have any questions regarding your exam or if you need to reschedule, you may call the CT department at [phone] between the hours of 8:00 am and 5:00 pm, Monday-Friday.   I have recommend some additional labs to evaluate your fatty liver on ultrasound. Thankfully, your liver enzymes are normal.  Your provider has requested that you go to the basement level for lab work. Press "B" on the elevator. The lab is located at the first door on the left as you exit the elevator.  PLEASE NOTHING TO EAT OR DRINK AFTER MIDNIGHT THE NIGHT BEFORE.  LABS SHOULD BE FASTING.  We have sent the following medications to your pharmacy for you to pick up at your convenience: Anusol suppositories  Use Anusol HC twice a day as needed for your hemorrhoids. We could consider banding in the future (when Covid19 restrictions are lifted) if the bleeding continues.   Work to maintain a healthy weight. It is easy to overeat and not get enough exercise  during this coronavirus. Please take good care of yourself!  Follow-up appointment after the CT scan to review the results and make additional plans.   These are such stressful times. Please stay home, safe, and healthy. Please let me know if you need anything else prior to our follow-up visit.   Thornton Park, MD

## 2019-04-18 ENCOUNTER — Ambulatory Visit
Admission: RE | Admit: 2019-04-18 | Discharge: 2019-04-18 | Disposition: A | Discharge status: Home / Self Care | Payer: PRIVATE HEALTH INSURANCE | Source: Ambulatory Visit | Attending: Gastroenterology | Admitting: Gastroenterology

## 2019-04-18 ENCOUNTER — Other Ambulatory Visit: Payer: Self-pay

## 2019-04-18 ENCOUNTER — Other Ambulatory Visit: Payer: Self-pay | Admitting: *Deleted

## 2019-04-18 ENCOUNTER — Other Ambulatory Visit (INDEPENDENT_AMBULATORY_CARE_PROVIDER_SITE_OTHER): Payer: PRIVATE HEALTH INSURANCE

## 2019-04-18 DIAGNOSIS — R140 Abdominal distension (gaseous): Secondary | ICD-10-CM

## 2019-04-18 DIAGNOSIS — R932 Abnormal findings on diagnostic imaging of liver and biliary tract: Secondary | ICD-10-CM

## 2019-04-18 LAB — IRON: Iron: 102 ug/dL (ref 42–145)

## 2019-04-18 LAB — GLUCOSE, RANDOM: Glucose, Bld: 111 mg/dL — ABNORMAL HIGH (ref 70–99)

## 2019-04-18 LAB — FERRITIN: Ferritin: 129.7 ng/mL (ref 10.0–291.0)

## 2019-04-18 MED ORDER — IOPAMIDOL (ISOVUE-300) INJECTION 61%
100.0000 mL | Freq: Once | INTRAVENOUS | Status: AC | PRN
  Administered 2019-04-18: 100 mL via INTRAVENOUS

## 2019-04-19 ENCOUNTER — Ambulatory Visit: Payer: PRIVATE HEALTH INSURANCE | Admitting: Psychiatry

## 2019-04-19 ENCOUNTER — Encounter: Payer: Self-pay | Admitting: *Deleted

## 2019-04-20 ENCOUNTER — Ambulatory Visit (INDEPENDENT_AMBULATORY_CARE_PROVIDER_SITE_OTHER): Payer: PRIVATE HEALTH INSURANCE | Admitting: Psychiatry

## 2019-04-20 ENCOUNTER — Telehealth: Payer: Self-pay

## 2019-04-20 ENCOUNTER — Other Ambulatory Visit: Payer: Self-pay

## 2019-04-20 ENCOUNTER — Encounter: Payer: Self-pay | Admitting: Psychiatry

## 2019-04-20 DIAGNOSIS — F331 Major depressive disorder, recurrent, moderate: Secondary | ICD-10-CM

## 2019-04-20 DIAGNOSIS — F411 Generalized anxiety disorder: Secondary | ICD-10-CM

## 2019-04-20 MED ORDER — SERTRALINE HCL 100 MG PO TABS: 200.0000 mg | ORAL_TABLET | Freq: Every day | ORAL | 2 refills | Status: DC

## 2019-04-20 MED ORDER — BREXPIPRAZOLE 2 MG PO TABS: 2.0000 mg | ORAL_TABLET | Freq: Every day | ORAL | 0 refills | Status: DC

## 2019-04-20 NOTE — Progress Notes (Signed)
Amy Mcintyre [id number] 1966-05-18 53 y.o.  Virtual Visit via Telephone Note  I connected with pt on 04/20/19 at  2:30 PM EDT by telephone and verified that I am speaking with the correct person using two identifiers.   I discussed the limitations, risks, security and privacy concerns of performing an evaluation and management service by telephone and the availability of in person appointments. I also discussed with the patient that there may be a patient responsible charge related to this service. The patient expressed understanding and agreed to proceed.   I discussed the assessment and treatment plan with the patient. The patient was provided an opportunity to ask questions and all were answered. The patient agreed with the plan and demonstrated an understanding of the instructions.   The patient was advised to call back or seek an in-person evaluation if the symptoms worsen or if the condition fails to improve as anticipated.  I provided 30 minutes of non-face-to-face time during this encounter.  The patient was located at home.  The provider was located at home.   Thayer Headings, PMHNP   Subjective:   Patient ID:  Amy Mcintyre is a 53 y.o. (DOB October 26, 1966) female.  Chief Complaint:  Chief Complaint  Patient presents with  . Anxiety  . Depression  . Insomnia    HPI Amy Mcintyre presents for follow-up of anxiety, depression, and insomnia. "I've been a little more anxious and depressed" because of pandemic. Reports that she has had increased anxiety since the start of COVID 19. She reports that her sleep is somewhat disrupted and consistent with baseline. She reports having some anxiety about how long the pandemic will last and not enjoying working from home. She reports that she has missed the socialization of going to work and does not like [employer]. Reports that she has anxiety about GI issues and had CT scan that showed an area on her liver and being referred for an MRI on  Monday. She reports some catastrophic thinking about her health and worrying about who would be able to assist her if she needed help. She reports that her mood has been persistently sad. App has been ok. She reports that she has had lower energy and motivation. Reports that she is trying to walk. She reports that she has been gaining weight. She reports that sometimes she has difficulty getting started with work and then is able to focus. Denies SI.   She reports that she has been having Zoom sessions with her therapist.   Reports that [name] stayed with her for 6 weeks when transitioning jobs. She reports that this went well and she was sad when he moved out about a month ago. Was able to travel to visit [name] recently. She reports that her family members have been wanting to avoid contact due to minimizing risk of COVID 19. Reports that her daughter is doing well and hopes to be able to visit her soon.   Reports that she has been needing to take Klonopin 1/2 tab po many mornings and typically 1 tab at bedtime. On rare occasions will take an additional 1/2 tab in the afternoon.   Past Psychiatric Medication Trials: Paxil Prozac Sertraline Nortriptyline Doxepin BuSpar Abilify-increased heart rate Seroquel-adverse effects Gabapentin Lunesta-not effective Ambien- parasomnias Trazodone-ineffective Belsomra-worsening insomnia Klonopin  Review of Systems:  Review of Systems  Gastrointestinal: Positive for abdominal distention, diarrhea and nausea.  Musculoskeletal: Negative for gait problem.  Neurological: Negative for tremors.  Psychiatric/Behavioral:  Please refer to HPI    Medications: I have reviewed the patient's current medications.  Current Outpatient Medications  Medication Sig Dispense Refill  . atorvastatin (LIPITOR) 40 MG tablet Take 40 mg by mouth daily.    . baclofen (LIORESAL) 20 MG tablet 20 mg 3 (three) times daily. TAKES BID    . cholecalciferol (VITAMIN D) 1000  units tablet Take 1,000 Units by mouth daily.    . clonazePAM (KLONOPIN) 1 MG tablet Take 1/2-1 tab po BID prn panic or insomnia 60 tablet 2  . Lactobacillus (PROBIOTIC ACIDOPHILUS PO) Take by mouth.    . levothyroxine (SYNTHROID, LEVOTHROID) 75 MCG tablet Take 75 mcg by mouth daily.     . meloxicam (MOBIC) 7.5 MG tablet Take 7.5 mg by mouth 2 (two) times daily as needed.    Mar[place] Kitchen MILK THISTLE PO Take by mouth.    . pantoprazole (PROTONIX) 40 MG tablet Take 1 tablet (40 mg total) by mouth daily. 90 tablet 3  . sertraline (ZOLOFT) 100 MG tablet Take 2 tablets (200 mg total) by mouth daily. 60 tablet 2  . Brexpiprazole (REXULTI) 2 MG TABS Take 2 mg by mouth daily for 30 days. 30 tablet 0  . hydrocortisone (ANUSOL-HC) 25 MG suppository Place 1 suppository (25 mg total) rectally 2 (two) times daily. 28 suppository 0   No current facility-administered medications for this visit.     Medication Side Effects: Other: Questions if Rexulti may be causing weight gain.   Allergies:  Allergies  Allergen Reactions  . Ambien [Zolpidem Tartrate]   . Erythromycin Swelling  . Penicillins Swelling    Past Medical History:  Diagnosis Date  . Anxiety   . Colitis   . Depression   . Eosinophilic esophagitis   . Gastritis   . GERD (gastroesophageal reflux disease)   . Hyperlipidemia   . Thyroid disease   . TMJ (dislocation of temporomandibular joint)     Family History  Problem Relation Age of Onset  . Anxiety disorder Mother   . Panic disorder Mother   . OCD Mother   . Anxiety disorder Father   . Depression Father   . Colon polyps Father   . Depression Maternal Grandfather   . Depression Paternal Grandmother   . Drug abuse Brother   . Colon cancer Neg Hx   . Esophageal cancer Neg Hx   . Pancreatic cancer Neg Hx   . Stomach cancer Neg Hx   . Liver disease Neg Hx   . Rectal cancer Neg Hx     Social History   Socioeconomic History  . Marital status: Single    Spouse name: Not on file   . Number of children: 1  . Years of education: Not on file  . Highest education level: Not on file  Occupational History  . Occupation: MANAGER  Social Needs  . Financial resource strain: Not on file  . Food insecurity:    Worry: Not on file    Inability: Not on file  . Transportation needs:    Medical: Not on file    Non-medical: Not on file  Tobacco Use  . Smoking status: Never Smoker  . Smokeless tobacco: Never Used  Substance and Sexual Activity  . Alcohol use: Yes    Comment: socially  . Drug use: Never  . Sexual activity: Not on file  Lifestyle  . Physical activity:    Days per week: Not on file    Minutes per session: Not on file  .  Stress: Not on file  Relationships  . Social connections:    Talks on phone: Not on file    Gets together: Not on file    Attends religious service: Not on file    Active member of club or organization: Not on file    Attends meetings of clubs or organizations: Not on file    Relationship status: Not on file  . Intimate partner violence:    Fear of current or ex partner: Not on file    Emotionally abused: Not on file    Physically abused: Not on file    Forced sexual activity: Not on file  Other Topics Concern  . Not on file  Social History Narrative  . Not on file    Past Medical History, Surgical history, Social history, and Family history were reviewed and updated as appropriate.   Please see review of systems for further details on the patient's review from today.   Objective:   Physical Exam:  There were no vitals taken for this visit.  Physical Exam Neurological:     Mental Status: She is alert and oriented to person, place, and time.     Cranial Nerves: No dysarthria.  Psychiatric:        Attention and Perception: Attention normal.        Mood and Affect: Mood is anxious and depressed.        Speech: Speech normal.        Behavior: Behavior is cooperative.        Thought Content: Thought content normal.  Thought content is not paranoid or delusional. Thought content does not include homicidal or suicidal ideation. Thought content does not include homicidal or suicidal plan.        Cognition and Memory: Cognition and memory normal.        Judgment: Judgment normal.     Lab Review:     Component Value Date/Time   NA 140 12/13/2018 1158   K 4.0 12/13/2018 1158   CL 102 12/13/2018 1158   CO2 29 12/13/2018 1158   GLUCOSE 111 (H) 04/18/2019 0821   BUN 23 12/13/2018 1158   CREATININE 0.69 12/13/2018 1158   CALCIUM 10.0 12/13/2018 1158   PROT 7.6 12/13/2018 1158   ALBUMIN 4.7 12/13/2018 1158   AST 22 12/13/2018 1158   ALT 29 12/13/2018 1158   ALKPHOS 82 12/13/2018 1158   BILITOT 0.5 12/13/2018 1158       Component Value Date/Time   WBC 4.6 12/13/2018 1158   RBC 4.47 12/13/2018 1158   HGB 13.5 12/13/2018 1158   HCT 40.6 12/13/2018 1158   PLT 258.0 12/13/2018 1158   MCV 90.6 12/13/2018 1158   MCHC 33.3 12/13/2018 1158   RDW 13.7 12/13/2018 1158   LYMPHSABS 1.4 12/13/2018 1158   MONOABS 0.4 12/13/2018 1158   EOSABS 0.0 12/13/2018 1158   BASOSABS 0.0 12/13/2018 1158    No results found for: POCLITH, LITHIUM   No results found for: PHENYTOIN, PHENOBARB, VALPROATE, CBMZ   .res Assessment: Plan:   Discussed potential benefits, risk, and side effects of increasing Rexulti to 2 mg daily to improve mood and anxiety.  Patient agrees to increase in [place]. Continue Klonopin 1 mg 1/2 to 1 tablet p.o. daily as needed anxiety. Continue sertraline 200 mg daily for depression and anxiety. Patient to follow-up in 4 weeks or sooner if clinically indicated. Patient advised to contact office with any questions, adverse effects, or acute worsening in signs and symptoms.  Generalized anxiety disorder - Plan: Brexpiprazole (REXULTI) 2 MG TABS, sertraline (ZOLOFT) 100 MG tablet  Moderate episode of recurrent major depressive disorder ([place]) - Plan: Brexpiprazole (REXULTI) 2 MG TABS, sertraline  (ZOLOFT) 100 MG tablet  Please see After Visit Summary for patient specific instructions.  Future Appointments  Date Time Provider [place]  04/23/2019  8:00 AM [facility] 1 [facility] [place]  05/16/2019  8:30 AM Thayer Headings, PMHNP CP-CP None    No orders of the defined types were placed in this encounter.     -------------------------------

## 2019-04-20 NOTE — Telephone Encounter (Signed)
Called patient and verified that her MRI appt. On 04/23/19 has been changed to 8:00am, to be there 7:30am

## 2019-04-23 ENCOUNTER — Encounter (HOSPITAL_COMMUNITY): Payer: Self-pay

## 2019-04-23 ENCOUNTER — Other Ambulatory Visit: Payer: Self-pay

## 2019-04-23 ENCOUNTER — Telehealth: Payer: Self-pay | Admitting: Psychiatry

## 2019-04-23 ENCOUNTER — Ambulatory Visit (HOSPITAL_COMMUNITY): Admission: RE | Admit: 2019-04-23 | Payer: PRIVATE HEALTH INSURANCE | Source: Ambulatory Visit

## 2019-04-23 ENCOUNTER — Ambulatory Visit (HOSPITAL_COMMUNITY): Payer: PRIVATE HEALTH INSURANCE

## 2019-04-23 DIAGNOSIS — F331 Major depressive disorder, recurrent, moderate: Secondary | ICD-10-CM

## 2019-04-23 DIAGNOSIS — F411 Generalized anxiety disorder: Secondary | ICD-10-CM

## 2019-04-23 DIAGNOSIS — R932 Abnormal findings on diagnostic imaging of liver and biliary tract: Secondary | ICD-10-CM

## 2019-04-23 MED ORDER — BREXPIPRAZOLE 2 MG PO TABS: 2.0000 mg | ORAL_TABLET | Freq: Every day | ORAL | 0 refills | Status: DC

## 2019-04-23 NOTE — Addendum Note (Signed)
Addended by: Sharyl Nimrod on: 04/23/2019 12:39 PM   Modules accepted: Orders

## 2019-04-23 NOTE — Telephone Encounter (Signed)
Patient stated called pharmacy to get refill on Rexulti and was told it was denied by our office.  Please advise, patient stated she had this problem before.

## 2019-04-24 ENCOUNTER — Telehealth: Payer: Self-pay | Admitting: *Deleted

## 2019-04-24 ENCOUNTER — Ambulatory Visit (HOSPITAL_COMMUNITY)
Admission: RE | Admit: 2019-04-24 | Discharge: 2019-04-24 | Disposition: A | Discharge status: Home / Self Care | Payer: PRIVATE HEALTH INSURANCE | Source: Ambulatory Visit | Attending: Gastroenterology | Admitting: Gastroenterology

## 2019-04-24 ENCOUNTER — Other Ambulatory Visit: Payer: Self-pay

## 2019-04-24 ENCOUNTER — Other Ambulatory Visit: Payer: Self-pay | Admitting: *Deleted

## 2019-04-24 DIAGNOSIS — R932 Abnormal findings on diagnostic imaging of liver and biliary tract: Secondary | ICD-10-CM | POA: Diagnosis present

## 2019-04-24 LAB — LIVER FIBROSIS, FIBROTEST-ACTITEST
ALT: 39 U/L — ABNORMAL HIGH (ref 6–29)
Alpha-2-Macroglobulin: 172 mg/dL (ref 106–279)
Apolipoprotein A1: 165 mg/dL (ref 101–198)
Bilirubin: 0.4 mg/dL (ref 0.2–1.2)
Fibrosis Score: 0.13
GGT: 82 U/L — ABNORMAL HIGH (ref 3–70)
Haptoglobin: 176 mg/dL (ref 43–212)
Necroinflammat ACT Score: 0.17
Reference ID: 2942029

## 2019-04-24 LAB — HEPATITIS B SURFACE ANTIGEN: Hepatitis B Surface Ag: NONREACTIVE

## 2019-04-24 LAB — IGG: IgG (Immunoglobin G), Serum: 811 mg/dL (ref 600–1640)

## 2019-04-24 LAB — ANA: Anti Nuclear Antibody (ANA): NEGATIVE

## 2019-04-24 LAB — HEPATITIS C ANTIBODY
Hepatitis C Ab: NONREACTIVE
SIGNAL TO CUT-OFF: 0.01 (ref <1.00)

## 2019-04-24 LAB — HEPATITIS B CORE ANTIBODY, IGM: Hep B C IgM: NONREACTIVE

## 2019-04-24 LAB — INSULIN, FREE (BIOACTIVE): Insulin, Free: 16.6 u[IU]/mL — ABNORMAL HIGH (ref 1.5–14.9)

## 2019-04-24 MED ORDER — GADOBUTROL 1 MMOL/ML IV SOLN
7.0000 mL | Freq: Once | INTRAVENOUS | Status: AC | PRN
  Administered 2019-04-24: 7 mL via INTRAVENOUS

## 2019-04-24 NOTE — Telephone Encounter (Signed)
Patient notified of MRI results.   Repeat MRI orders in Epic and scheduled on 08/07/19 at 9:00 am with an arrival time of 8:30. Patient told nothing to eat or drink after midnight. Patient denies additional needs or concerns at conclusion of the phone call.

## 2019-04-27 NOTE — Progress Notes (Signed)
TELEHEALTH VISIT  Referring Provider: Tobe Sos, MD Primary Care Physician:  Tobe Sos, MD   Tele-visit due to COVID-19 pandemic Patient requested visit virtually, consented to the virtual encounter via audio and visual technology (Zoom) Contact made at: 04/30/19 1:30PM Patient verified by name and date of birth Location of patient: Home Location provider: Office Names of persons participating: Me, patient, [name] CMA Time spent on telehealth visit: 28 minutes  Chief complaint:  Bloating, abnormal liver on CT   IMPRESSION:  Worsened bloating and abdominal distension without etiology of CT    - occurred with concurrent weight gain Chronic diarrhea with intermittent bloating x 1 year    - likely due to hypothyroidism +/- functional diarrhea    - testing for celiac negative, giardia    - normal fecal calprotectin    - negative colon and duodenal biopsies    - not improved by probiotics    - breath test negative for SIBO by hydrogen or methane    - symptoms resolved with regulation of thyroid Fatty liver on imaging and by labs    - hepatic steatosis on ultrasound 12/13/18 with ALT 29    - 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    - testing for viral hepatitis and autoimmune hepatitis negative    - Fibrotest: fibrosis stage of 1, necroinflammatory grade of A0-A1 Intermittent rectal bleeding attributed to internal hemorrhoids Newly diagnosed thyroid dysfunction, now improved Reflux (biopsy proven) on EGD 10/19/18    - completed 8 weeks of PPI therapy Tubular adenoma 2019    - surveillance colonoscopy due 2024 Family history of colon polyps (father in his 91s)   Diarrhea had improved with normalization of thyroid function, however, she now has progressive abdominal bloating and distension and the return of some diarrhea.  CT and MRI show no intraabdominal process. Reassurances provided. Will refer for food allergy testing.   Labs and imaging  suggest a diagnosis of fatty liver.  Fibro-test showed no significant fibrosis.  Labs show HOMA of 4.5, suggesting insulin resistance.  I recommended that she follow-up with Dr. Shon Millet to review this diagnosis and possible treatment options. Reviewed the natural history and treatment options of fatty liver. Information brochure provided. Treatment is currently focused on working towards a healthy weight, regular exercise, and maximizing control of diabetes and cholesterol abnormalities. Hopefully, medical therapy will be available in the future. There is a known increased risk of cardiovascular disease in the patients with NASH.  CT showed abnormality in the liver. MRI suggests FNH. Will repeat contrasted MRI in 3-6 months to confirm diagnosis. If MRI is reassuring at that time, no further imaging will be needed.    PLAN: - Review potential diagnosis of insulin resistance and bone island on the CT with Dr. Shon Millet - Plan MRI of the liver with and without gadolinium in 3-6 months - Referral to Dr. Tiajuana Amass for food allergy testing - Follow-up encounter after results of food allergy testing are available, or earlier as needed  HPI: Amy Mcintyre is a 53 y.o. female with recent hypthyroid associated diarrhea and concerns of bloating. Last appointment 04/06/19.   Hemorrhoids started bleeding yesterday after a bout of diarrhea.  Having dijon mustard colored, chalky, poorly formed stools. Seeing the blood on the toilet paper. Started Anusol HC suppositories last night. No identified triggers for the diarrhea. She is not particularly worried about the diarrhea today and notes that she is feels like she is getting used to it  after more than a year of similar symptoms.   A CT of the abdomen and pelvis with contrast 04/18/2019 showed no acute findings.  A 2.7 cm focal lesion was identified in segment 8 of the liver.  There is an increased left parametrial vein which may be seen with pelvic venous congestion  syndrome.  A subsequent MRI  04/24/2019 showed the CT abnormality in segment 7 most likely to be a focal nodular hyperplasia.  The radiologist recommended pre-and postcontrast abdominal MRI using Eovist in 3 to 6 months.  No acute abdominal process was seen. A bone island was also seen.   She reviewed the increased left parametrial vein with Dr. Enid Skeens, her gynecologist, told her that it was nothing to worry about.  She specifcally asked me today about the bone island in the left iliac bone on the CT scan.   Labs 04/18/2019 show a glucose of 111, ALT 39, bilirubin 0.4, GGT 8.2, iron 102, ferritin 129, IgG 811, haptoglobin 176, ANA negative, hepatitis B surface antigen negative hep B core antibody IgM nonreactive, hepatitis C antibody nonreactive.  Serum insulin was 16.6.  A calculated Homa score was 4.5. Fibro-test showed a fibrosis stage of 0, necroinflammatory grade A0 to A1.    Past Medical History:  Diagnosis Date  . Anxiety   . Colitis   . Depression   . Eosinophilic esophagitis   . Gastritis   . GERD (gastroesophageal reflux disease)   . Hyperlipidemia   . Thyroid disease   . TMJ (dislocation of temporomandibular joint)     Past Surgical History:  Procedure Laterality Date  . BREAST ENHANCEMENT SURGERY    . CESAREAN SECTION    . NASAL SINUS SURGERY    . tummy tuck    . WRIST SURGERY Left     Current Outpatient Medications  Medication Sig Dispense Refill  . atorvastatin (LIPITOR) 40 MG tablet Take 40 mg by mouth daily.    . baclofen (LIORESAL) 20 MG tablet 20 mg 3 (three) times daily. TAKES BID    . Brexpiprazole (REXULTI) 2 MG TABS Take 2 mg by mouth daily for 30 days. 30 tablet 0  . cholecalciferol (VITAMIN D) 1000 units tablet Take 1,000 Units by mouth daily.    . clonazePAM (KLONOPIN) 1 MG tablet Take 1/2-1 tab po BID prn panic or insomnia 60 tablet 2  . hydrocortisone (ANUSOL-HC) 25 MG suppository Place 1 suppository (25 mg total) rectally 2 (two) times daily. 28  suppository 0  . Lactobacillus (PROBIOTIC ACIDOPHILUS PO) Take by mouth.    . levothyroxine (SYNTHROID, LEVOTHROID) 75 MCG tablet Take 75 mcg by mouth daily.     . meloxicam (MOBIC) 7.5 MG tablet Take 7.5 mg by mouth 2 (two) times daily as needed.    Marland Kitchen MILK THISTLE PO Take by mouth.    . pantoprazole (PROTONIX) 40 MG tablet Take 1 tablet (40 mg total) by mouth daily. 90 tablet 3  . sertraline (ZOLOFT) 100 MG tablet Take 2 tablets (200 mg total) by mouth daily. 60 tablet 2   No current facility-administered medications for this visit.     Allergies as of 05/01/2019 - Review Complete 04/20/2019  Allergen Reaction Noted  . Ambien [zolpidem tartrate]  09/22/2018  . Erythromycin Swelling 09/22/2018  . Penicillins Swelling 09/22/2018    Family History  Problem Relation Age of Onset  . Anxiety disorder Mother   . Panic disorder Mother   . OCD Mother   . Anxiety disorder Father   .  Depression Father   . Colon polyps Father   . Depression Maternal Grandfather   . Depression Paternal Grandmother   . Drug abuse Brother   . Colon cancer Neg Hx   . Esophageal cancer Neg Hx   . Pancreatic cancer Neg Hx   . Stomach cancer Neg Hx   . Liver disease Neg Hx   . Rectal cancer Neg Hx     Social History   Socioeconomic History  . Marital status: Single    Spouse name: Not on file  . Number of children: 1  . Years of education: Not on file  . Highest education level: Not on file  Occupational History  . Occupation: MANAGER  Social Needs  . Financial resource strain: Not on file  . Food insecurity:    Worry: Not on file    Inability: Not on file  . Transportation needs:    Medical: Not on file    Non-medical: Not on file  Tobacco Use  . Smoking status: Never Smoker  . Smokeless tobacco: Never Used  Substance and Sexual Activity  . Alcohol use: Yes    Comment: socially  . Drug use: Never  . Sexual activity: Not on file  Lifestyle  . Physical activity:    Days per week: Not on  file    Minutes per session: Not on file  . Stress: Not on file  Relationships  . Social connections:    Talks on phone: Not on file    Gets together: Not on file    Attends religious service: Not on file    Active member of club or organization: Not on file    Attends meetings of clubs or organizations: Not on file    Relationship status: Not on file  . Intimate partner violence:    Fear of current or ex partner: Not on file    Emotionally abused: Not on file    Physically abused: Not on file    Forced sexual activity: Not on file  Other Topics Concern  . Not on file  Social History Narrative  . Not on file    Review of Systems: ALL ROS discussed and all others negative except listed in HPI.  Physical Exam: General: in no acute distress. Appears more relaxed since our last visit.  Neuro: Alert and appropriate Psych: Normal affect and normal insight Exam otherwise limited due to telehealth encounter   [place]. Tarri Glenn, MD, MPH [facility] 04/27/2019, 2:27 PM

## 2019-05-01 ENCOUNTER — Encounter: Payer: Self-pay | Admitting: Gastroenterology

## 2019-05-01 ENCOUNTER — Ambulatory Visit (INDEPENDENT_AMBULATORY_CARE_PROVIDER_SITE_OTHER): Payer: PRIVATE HEALTH INSURANCE | Admitting: Gastroenterology

## 2019-05-01 ENCOUNTER — Other Ambulatory Visit: Payer: Self-pay

## 2019-05-01 VITALS — Ht 59.0 in | Wt 150.0 lb

## 2019-05-01 DIAGNOSIS — R140 Abdominal distension (gaseous): Secondary | ICD-10-CM | POA: Diagnosis not present

## 2019-05-01 DIAGNOSIS — R197 Diarrhea, unspecified: Secondary | ICD-10-CM | POA: Diagnosis not present

## 2019-05-01 DIAGNOSIS — R932 Abnormal findings on diagnostic imaging of liver and biliary tract: Secondary | ICD-10-CM | POA: Diagnosis not present

## 2019-05-01 DIAGNOSIS — K760 Fatty (change of) liver, not elsewhere classified: Secondary | ICD-10-CM | POA: Diagnosis not present

## 2019-05-01 NOTE — Patient Instructions (Addendum)
Your labs and imaging suggest a diagnosis of fatty liver. Recommendations to help your liver stay as healthy as possible include: - Abstain from all alcohol including beer, wine, liquor, and non-alcoholic beer. - Consider drinking 2-3 9 ounce cups of regular brewed coffee every day. Although limit your daily caffeine intake to no more than 400 mg.  - Improve sleep hygiene to match sleep and wake times during workdays and weekends.  Sleep at least 7-9 hours every night. Use blackout curtains, turn off lights at bedtime, and limit nighttime use of electronic devices and caffeine.  - Work to maintain a health weight through exercise and diet. Exercise alone has been proven to help the liver stay healthy.  Please talk to Dr. Shon Millet to see if you should have further testing or treatment for a possible diagnosis of insulin resistance.  I have recommended a referral to Dr. Tiajuana Amass for food allergy testing.  Let's plan to have another appointment after your food allergy testing. Please call with any additional questions or concerns before that time.

## 2019-05-04 ENCOUNTER — Ambulatory Visit: Payer: PRIVATE HEALTH INSURANCE | Admitting: Gastroenterology

## 2019-05-07 ENCOUNTER — Telehealth: Payer: Self-pay | Admitting: Gastroenterology

## 2019-05-07 NOTE — Telephone Encounter (Signed)
Notified patient that her information was faxed to Dr. Tiajuana Amass of [facility] on 5/28 by [facility] CMA. This RN gave the patient the address and telephone to [facility]. Nothing further at the time of the phone call.

## 2019-05-07 NOTE — Telephone Encounter (Signed)
Received fax from Dr. Seward Meth office appointment made for 05/10/2019 at 8:45 am with Dr. Orvil Feil. Pt aware of appointment.

## 2019-05-07 NOTE — Telephone Encounter (Signed)
Pt called in wanting to know about the referral that was to be sent for food allergy testing and ordered by dr.beavers.

## 2019-05-09 NOTE — Telephone Encounter (Signed)
Spoke with patient she will call her insurance and see which providers are in network and call us back.

## 2019-05-09 NOTE — Telephone Encounter (Signed)
Ok to refer patient to another [facility]? [facility]'s also [facility].

## 2019-05-09 NOTE — Telephone Encounter (Signed)
Any allergist can do it. I recommended Dr. Orvil Feil because she is particularly good with food allergies.  An allergist within her network would be preferred. Thanks.

## 2019-05-09 NOTE — Telephone Encounter (Signed)
Patient called in to advise the nurse that Dr. Seward Meth office is not in her network and she would like a referral to a clinic that is in her network.

## 2019-05-11 ENCOUNTER — Telehealth: Payer: Self-pay | Admitting: Gastroenterology

## 2019-05-11 NOTE — Telephone Encounter (Signed)
Pt stated that Dr. Tarri Glenn had referred her to an allergist who doesn't accept her insurance.  Pt would like to be referred to Dr. Ellwood Dense at [facility].  Phone [phone]

## 2019-05-11 NOTE — Telephone Encounter (Signed)
Information faxed to Dr. Lynford Humphrey of [facility]) Fax: [phone]

## 2019-05-16 ENCOUNTER — Encounter: Payer: Self-pay | Admitting: Psychiatry

## 2019-05-16 ENCOUNTER — Ambulatory Visit (INDEPENDENT_AMBULATORY_CARE_PROVIDER_SITE_OTHER): Payer: PRIVATE HEALTH INSURANCE | Admitting: Psychiatry

## 2019-05-16 ENCOUNTER — Other Ambulatory Visit: Payer: Self-pay

## 2019-05-16 VITALS — Wt 149.0 lb

## 2019-05-16 DIAGNOSIS — F5101 Primary insomnia: Secondary | ICD-10-CM | POA: Diagnosis not present

## 2019-05-16 DIAGNOSIS — F411 Generalized anxiety disorder: Secondary | ICD-10-CM

## 2019-05-16 DIAGNOSIS — F331 Major depressive disorder, recurrent, moderate: Secondary | ICD-10-CM

## 2019-05-16 DIAGNOSIS — F330 Major depressive disorder, recurrent, mild: Secondary | ICD-10-CM

## 2019-05-16 MED ORDER — METFORMIN HCL 500 MG PO TABS: ORAL_TABLET | ORAL | 2 refills | Status: DC

## 2019-05-16 MED ORDER — SERTRALINE HCL 100 MG PO TABS: 200.0000 mg | ORAL_TABLET | Freq: Every day | ORAL | 5 refills | Status: DC

## 2019-05-16 MED ORDER — BREXPIPRAZOLE 2 MG PO TABS: 2.0000 mg | ORAL_TABLET | Freq: Every day | ORAL | 3 refills | Status: DC

## 2019-05-16 NOTE — Progress Notes (Signed)
Amy Mcintyre [id number] [birth year] 53 y.o.  Subjective:   Patient ID:  Amy Mcintyre is a 53 y.o. (DOB [birth year]) female.  Chief Complaint:  Chief Complaint  Patient presents with  . Follow-up    h/o Depression, Anxiety, and Insomnia    HPI Amy Mcintyre presents to the office today for follow-up of mood and anxiety. "I'm ok mood-wise." Reports that she is no longer having uncontrolled crying. She reports that she has some periods where mood is somewhat lower. Reports that she is trying to avoid spending the weekend at home alone since this triggers low mood- "I'm ok during the week."  Denies any recent panic attacks. Some worry about family members that have had to work in high risk situations.   Reports that she has new neighbors and they work alternate schedules where they are awake for midnight to 3 am. She reports that she has requested neighbors limit their volume at those hours and has been using ear plugs and white noise machine. She reports that she is able to return to sleep with using ear plugs. Also awakening during the night due to night sweats. Appetite has been good. Energy has been ok. Reports motivation is fair. Denies anhedonia. She reports concentration has been ok. Denies SI.   She reports that she would like to lose weight. She reports that she is trying to increase activity level.   Reports learning that issue with her liver was determined to be a benign lesion. Has upcoming food allergy testing.   Working from home the majority of the time and goes into the office a few days a week. Was able to visit her daughter one weekend.   Past Psychiatric Medication Trials: Paxil Prozac Sertraline Nortriptyline Doxepin BuSpar Abilify-increased heart rate Seroquel-adverse effects Gabapentin Lunesta-not effective Ambien-parasomnias Trazodone-ineffective Belsomra-worsening insomnia Klonopin  Review of Systems:  Review of Systems  Eyes: Negative for visual  disturbance.       Learned yesterday that she has 2 cataracts  Gastrointestinal: Positive for abdominal distention and abdominal pain.       Occ bloating and discomfort  Musculoskeletal: Negative for gait problem.  Neurological: Negative for tremors.       Denies involuntary movements  Psychiatric/Behavioral:       Please refer to HPI   Reports that her sleep study was WNL and does not have sleep apnea or RLS.  Medications: I have reviewed the patient's current medications.  Current Outpatient Medications  Medication Sig Dispense Refill  . atorvastatin (LIPITOR) 40 MG tablet Take 40 mg by mouth daily.    . baclofen (LIORESAL) 20 MG tablet 20 mg 3 (three) times daily. TAKES BID    . Brexpiprazole (REXULTI) 2 MG TABS Take 2 mg by mouth daily for 30 days. 30 tablet 3  . cholecalciferol (VITAMIN D) 1000 units tablet Take 1,000 Units by mouth daily.    . clonazePAM (KLONOPIN) 1 MG tablet Take 1/2-1 tab po BID prn panic or insomnia 60 tablet 2  . hydrocortisone (ANUSOL-HC) 25 MG suppository Place 1 suppository (25 mg total) rectally 2 (two) times daily. 28 suppository 0  . Lactobacillus (PROBIOTIC ACIDOPHILUS PO) Take by mouth.    . levothyroxine (SYNTHROID, LEVOTHROID) 75 MCG tablet Take 75 mcg by mouth daily.     . meloxicam (MOBIC) 7.5 MG tablet Take 7.5 mg by mouth 2 (two) times daily as needed.    Marland Kitchen MILK THISTLE PO Take by mouth.    . pantoprazole (PROTONIX) 40 MG tablet Take 1  tablet (40 mg total) by mouth daily. 90 tablet 3  . sertraline (ZOLOFT) 100 MG tablet Take 2 tablets (200 mg total) by mouth daily. 60 tablet 5  . metFORMIN (GLUCOPHAGE) 500 MG tablet Take 1 tablet daily with a meal for one week, and then increase to 1 tablet twice daily with meals if tolerated 60 tablet 2   No current facility-administered medications for this visit.     Medication Side Effects: None  Allergies:  Allergies  Allergen Reactions  . Ambien [Zolpidem Tartrate]   . Erythromycin Swelling  .  Penicillins Swelling    Past Medical History:  Diagnosis Date  . Anxiety   . Cataracts, bilateral   . Colitis   . Depression   . Eosinophilic esophagitis   . Fatty liver   . Gastritis   . GERD (gastroesophageal reflux disease)   . Hyperlipidemia   . Thyroid disease   . TMJ (dislocation of temporomandibular joint)     Family History  Problem Relation Age of Onset  . Anxiety disorder Mother   . Panic disorder Mother   . OCD Mother   . Anxiety disorder Father   . Depression Father   . Colon polyps Father   . Depression Maternal Grandfather   . Depression Paternal Grandmother   . Drug abuse Brother   . Colon cancer Neg Hx   . Esophageal cancer Neg Hx   . Pancreatic cancer Neg Hx   . Stomach cancer Neg Hx   . Liver disease Neg Hx   . Rectal cancer Neg Hx     Social History   Socioeconomic History  . Marital status: Single    Spouse name: Not on file  . Number of children: 1  . Years of education: Not on file  . Highest education level: Not on file  Occupational History  . Occupation: MANAGER  Social Needs  . Financial resource strain: Not on file  . Food insecurity:    Worry: Not on file    Inability: Not on file  . Transportation needs:    Medical: Not on file    Non-medical: Not on file  Tobacco Use  . Smoking status: Never Smoker  . Smokeless tobacco: Never Used  Substance and Sexual Activity  . Alcohol use: Yes    Comment: socially  . Drug use: Never  . Sexual activity: Not on file  Lifestyle  . Physical activity:    Days per week: Not on file    Minutes per session: Not on file  . Stress: Not on file  Relationships  . Social connections:    Talks on phone: Not on file    Gets together: Not on file    Attends religious service: Not on file    Active member of club or organization: Not on file    Attends meetings of clubs or organizations: Not on file    Relationship status: Not on file  . Intimate partner violence:    Fear of current or ex  partner: Not on file    Emotionally abused: Not on file    Physically abused: Not on file    Forced sexual activity: Not on file  Other Topics Concern  . Not on file  Social History Narrative  . Not on file    Past Medical History, Surgical history, Social history, and Family history were reviewed and updated as appropriate.   Please see review of systems for further details on the patient's review from today.  Objective:   Physical Exam:  Wt 149 lb (67.6 kg)   BMI 30.09 kg/m   Physical Exam Constitutional:      General: She is not in acute distress.    Appearance: She is well-developed.  Musculoskeletal:        General: No deformity.  Neurological:     Mental Status: She is alert and oriented to person, place, and time.     Coordination: Coordination normal.  Psychiatric:        Mood and Affect: Mood is not depressed. Affect is not labile, blunt, angry or inappropriate.        Speech: Speech normal.        Behavior: Behavior normal.        Thought Content: Thought content normal. Thought content does not include homicidal or suicidal ideation. Thought content does not include homicidal or suicidal plan.        Judgment: Judgment normal.     Comments: Mood presents as less anxious compared to recent exams. Insight intact. No auditory or visual hallucinations. No delusions.      Lab Review:     Component Value Date/Time   NA 140 12/13/2018 1158   K 4.0 12/13/2018 1158   CL 102 12/13/2018 1158   CO2 29 12/13/2018 1158   GLUCOSE 111 (H) 04/18/2019 0821   BUN 23 12/13/2018 1158   CREATININE 0.69 12/13/2018 1158   CALCIUM 10.0 12/13/2018 1158   PROT 7.6 12/13/2018 1158   ALBUMIN 4.7 12/13/2018 1158   AST 22 12/13/2018 1158   ALT 39 (H) 04/18/2019 0821   ALKPHOS 82 12/13/2018 1158   BILITOT 0.5 12/13/2018 1158       Component Value Date/Time   WBC 4.6 12/13/2018 1158   RBC 4.47 12/13/2018 1158   HGB 13.5 12/13/2018 1158   HCT 40.6 12/13/2018 1158   PLT  258.0 12/13/2018 1158   MCV 90.6 12/13/2018 1158   MCHC 33.3 12/13/2018 1158   RDW 13.7 12/13/2018 1158   LYMPHSABS 1.4 12/13/2018 1158   MONOABS 0.4 12/13/2018 1158   EOSABS 0.0 12/13/2018 1158   BASOSABS 0.0 12/13/2018 1158    No results found for: POCLITH, LITHIUM   No results found for: PHENYTOIN, PHENOBARB, VALPROATE, CBMZ   .res Assessment: Plan:   Patient seen for 30 minutes and greater than 50% of session spent counseling patient regarding possible metabolic and weight gain side effects with Rexulti and possible strategies to minimize antipsychotic induced weight gain.  Patient reports that overall benefits outweigh side effects.  Discussed potential benefits, risks, and side effects of metformin for antipsychotic induced weight gain.  Discussed starting metformin 500 mg once daily with meals for 1 week and then increasing to twice daily to minimize GI side effects.  Discussed that metformin could be stopped if she is having intolerable side effects. Continue Rexulti 2 mg daily for depression and anxiety. Continue Klonopin 1 mg 1/2 to 1 tablet twice daily as needed for panic or insomnia. Continue sertraline 200 mg daily for depression and anxiety. Recommend continuing to see Rodena Goldmann, [facility] for therapy. Patient to follow-up in 3 months or sooner if clinically indicated.  Generalized anxiety disorder - Plan: Brexpiprazole (REXULTI) 2 MG TABS, sertraline (ZOLOFT) 100 MG tablet  Mild episode of recurrent major depressive disorder (HCC)  Primary insomnia  Moderate episode of recurrent major depressive disorder ([place]) - Plan: Brexpiprazole (REXULTI) 2 MG TABS, sertraline (ZOLOFT) 100 MG tablet  Please see After Visit Summary for patient specific instructions.  Future Appointments  Date Time Provider [place]  08/07/2019  9:00 AM [facility] 1 [facility] [place]  08/16/2019  8:30 AM Thayer Headings, PMHNP CP-CP None    No orders of the defined types were placed in this  encounter.     -------------------------------

## 2019-07-06 ENCOUNTER — Other Ambulatory Visit: Payer: Self-pay | Admitting: Psychiatry

## 2019-07-06 DIAGNOSIS — F5101 Primary insomnia: Secondary | ICD-10-CM

## 2019-07-06 DIAGNOSIS — F411 Generalized anxiety disorder: Secondary | ICD-10-CM

## 2019-07-06 NOTE — Telephone Encounter (Signed)
appt 09/11 Last fill 06/25

## 2019-07-06 NOTE — Telephone Encounter (Signed)
Pt called to request refill on Clonazepam @ [facility]

## 2019-07-12 ENCOUNTER — Encounter: Payer: Self-pay | Admitting: Emergency Medicine

## 2019-08-07 ENCOUNTER — Ambulatory Visit (HOSPITAL_COMMUNITY)
Admission: RE | Admit: 2019-08-07 | Discharge: 2019-08-07 | Disposition: A | Discharge status: Home / Self Care | Payer: PRIVATE HEALTH INSURANCE | Source: Ambulatory Visit | Attending: Gastroenterology | Admitting: Gastroenterology

## 2019-08-07 ENCOUNTER — Other Ambulatory Visit: Payer: Self-pay

## 2019-08-07 DIAGNOSIS — R932 Abnormal findings on diagnostic imaging of liver and biliary tract: Secondary | ICD-10-CM | POA: Diagnosis present

## 2019-08-07 LAB — POCT I-STAT CREATININE: Creatinine, Ser: 0.7 mg/dL (ref 0.44–1.00)

## 2019-08-07 MED ORDER — GADOXETATE DISODIUM 0.25 MMOL/ML IV SOLN
10.0000 mL | Freq: Once | INTRAVENOUS | Status: AC | PRN
  Administered 2019-08-07: 6 mL via INTRAVENOUS

## 2019-08-16 ENCOUNTER — Ambulatory Visit: Payer: PRIVATE HEALTH INSURANCE | Admitting: Psychiatry

## 2019-08-20 ENCOUNTER — Other Ambulatory Visit: Payer: Self-pay | Admitting: Psychiatry

## 2019-08-30 ENCOUNTER — Ambulatory Visit (INDEPENDENT_AMBULATORY_CARE_PROVIDER_SITE_OTHER): Payer: PRIVATE HEALTH INSURANCE | Admitting: Psychiatry

## 2019-08-30 ENCOUNTER — Encounter: Payer: Self-pay | Admitting: Psychiatry

## 2019-08-30 ENCOUNTER — Other Ambulatory Visit: Payer: Self-pay

## 2019-08-30 DIAGNOSIS — F411 Generalized anxiety disorder: Secondary | ICD-10-CM | POA: Diagnosis not present

## 2019-08-30 DIAGNOSIS — F331 Major depressive disorder, recurrent, moderate: Secondary | ICD-10-CM | POA: Diagnosis not present

## 2019-08-30 DIAGNOSIS — F5101 Primary insomnia: Secondary | ICD-10-CM

## 2019-08-30 MED ORDER — SERTRALINE HCL 100 MG PO TABS: 250.0000 mg | ORAL_TABLET | Freq: Every day | ORAL | 2 refills | Status: DC

## 2019-08-30 MED ORDER — BREXPIPRAZOLE 2 MG PO TABS: 2.0000 mg | ORAL_TABLET | Freq: Every day | ORAL | 3 refills | Status: DC

## 2019-08-30 MED ORDER — CLONAZEPAM 1 MG PO TABS: ORAL_TABLET | ORAL | 1 refills | Status: DC

## 2019-08-30 NOTE — Progress Notes (Signed)
Amy Mcintyre [id number] January 24, 1966 53 y.o.  Subjective:   Patient ID:  Amy Mcintyre is a 54 y.o. (DOB 1966/03/25) female.  Chief Complaint:  Chief Complaint  Patient presents with  . Anxiety  . Depression    HPI Amy Mcintyre presents to the office today for follow-up of anxiety, depression, and insomnia. She reports that she was doing well until Monday when she drove from [place], [place] to home when she had a panic attack while driving. She reports that this was the first panic attack she has had when she was driving and started with intrusive thoughts- "what if I have a flat tire?" She reports that she has has had increased anxiety since that time and feeling overwhelmed by things. Has continued to have intrusive thoughts throughout the week- "what if I get covid?... what if someone broke into my house?" Reports that she has had acute GI s/s this week. Reports vomiting when leaving Amy Mcintyre's Monday morning. Reports increased worry. She reports that she has also been "extremely tired" even prior to this week with low motivation. She reports that she has not had recent crying episodes. She reports that mood has been sad and there is often some low level depression. She reports that her sleep has been adequate. Denies decreased appetite or wt loss with Metformin. She reports poor concentration this week. She reports difficulty staying focused when reading and recalling what she has read. Denies SI.   Notices that her mood is lower and then has increased anxiety after visit with Amy Mcintyre. Planning to visit daughter this weekend.   She reports that she increased Klonopin this week.   Moved August 1st. Reports that she has not stayed home one weekend since the move. Will go see her mother, boyfriend, or daughter. She reports decrease physical activity over the last 2 weeks.   Past Psychiatric Medication Trials: Paxil Prozac Sertraline Nortriptyline Doxepin BuSpar Abilify-increased heart  rate Seroquel-adverse effects Gabapentin Lunesta-not effective Ambien-parasomnias Trazodone-ineffective Belsomra-worsening insomnia Klonopin   Review of Systems:  Review of Systems  Gastrointestinal: Positive for diarrhea and nausea.  Musculoskeletal: Negative for gait problem.  Neurological: Negative for tremors.  Psychiatric/Behavioral:       Please refer to HPI  Increased TMJ  Medications: I have reviewed the patient's current medications.  Current Outpatient Medications  Medication Sig Dispense Refill  . atorvastatin (LIPITOR) 40 MG tablet Take 40 mg by mouth daily.    . baclofen (LIORESAL) 20 MG tablet 20 mg 3 (three) times daily. TAKES BID    . cholecalciferol (VITAMIN D) 1000 units tablet Take 1,000 Units by mouth daily.    . clonazePAM (KLONOPIN) 1 MG tablet TAKE 1/2 TO 1 (ONE-HALF TO ONE) TABLET BY MOUTH THREE TIMES DAILY AS NEEDED FOR PANIC ATTACK OR INSOMNIA 90 tablet 1  . Lactobacillus (PROBIOTIC ACIDOPHILUS PO) Take by mouth.    . levothyroxine (SYNTHROID, LEVOTHROID) 75 MCG tablet Take 75 mcg by mouth daily.     . meloxicam (MOBIC) 7.5 MG tablet Take 7.5 mg by mouth 2 (two) times daily as needed.    . metFORMIN (GLUCOPHAGE) 500 MG tablet TAKE 1 TABLET BY ONCE DAILY WITH A MEAL FOR ONE WEEK, AND THEN INCREASE TO 1 TABLET TWICE DAILY WITH MEALS IF TOLERATED 60 tablet 0  . pantoprazole (PROTONIX) 40 MG tablet Take 1 tablet (40 mg total) by mouth daily. 90 tablet 3  . sertraline (ZOLOFT) 100 MG tablet Take 2.5 tablets (250 mg total) by mouth daily. 75 tablet 2  .  brexpiprazole (REXULTI) 2 MG TABS tablet Take 1 tablet (2 mg total) by mouth daily. 30 tablet 3  . hydrocortisone (ANUSOL-HC) 25 MG suppository Place 1 suppository (25 mg total) rectally 2 (two) times daily. 28 suppository 0   No current facility-administered medications for this visit.     Medication Side Effects: Other: Difficulty losing weight  Allergies:  Allergies  Allergen Reactions  . Ambien  [Zolpidem Tartrate]   . Erythromycin Swelling  . Penicillins Swelling    Past Medical History:  Diagnosis Date  . Anxiety   . Cataracts, bilateral   . Colitis   . Depression   . Eosinophilic esophagitis   . Fatty liver   . Gastritis   . GERD (gastroesophageal reflux disease)   . Hyperlipidemia   . Thyroid disease   . TMJ (dislocation of temporomandibular joint)     Family History  Problem Relation Age of Onset  . Anxiety disorder Mother   . Panic disorder Mother   . OCD Mother   . Anxiety disorder Father   . Depression Father   . Colon polyps Father   . Depression Maternal Grandfather   . Depression Paternal Grandmother   . Drug abuse Brother   . Colon cancer Neg Hx   . Esophageal cancer Neg Hx   . Pancreatic cancer Neg Hx   . Stomach cancer Neg Hx   . Liver disease Neg Hx   . Rectal cancer Neg Hx     Social History   Socioeconomic History  . Marital status: Single    Spouse name: Not on file  . Number of children: 1  . Years of education: Not on file  . Highest education level: Not on file  Occupational History  . Occupation: MANAGER  Social Needs  . Financial resource strain: Not on file  . Food insecurity    Worry: Not on file    Inability: Not on file  . Transportation needs    Medical: Not on file    Non-medical: Not on file  Tobacco Use  . Smoking status: Never Smoker  . Smokeless tobacco: Never Used  Substance and Sexual Activity  . Alcohol use: Yes    Comment: socially  . Drug use: Never  . Sexual activity: Not on file  Lifestyle  . Physical activity    Days per week: Not on file    Minutes per session: Not on file  . Stress: Not on file  Relationships  . Social Herbalist on phone: Not on file    Gets together: Not on file    Attends religious service: Not on file    Active member of club or organization: Not on file    Attends meetings of clubs or organizations: Not on file    Relationship status: Not on file  .  Intimate partner violence    Fear of current or ex partner: Not on file    Emotionally abused: Not on file    Physically abused: Not on file    Forced sexual activity: Not on file  Other Topics Concern  . Not on file  Social History Narrative  . Not on file    Past Medical History, Surgical history, Social history, and Family history were reviewed and updated as appropriate.   Please see review of systems for further details on the patient's review from today.   Objective:   Physical Exam:  Wt 147 lb (66.7 kg)   BMI 29.69 kg/m  Physical Exam Constitutional:      General: She is not in acute distress.    Appearance: She is well-developed.  Musculoskeletal:        General: No deformity.  Neurological:     Mental Status: She is alert and oriented to person, place, and time.     Coordination: Coordination normal.  Psychiatric:        Attention and Perception: Attention and perception normal. She does not perceive auditory or visual hallucinations.        Mood and Affect: Mood is anxious. Mood is not depressed. Affect is blunt. Affect is not labile, angry or inappropriate.        Speech: Speech normal.        Behavior: Behavior normal.        Thought Content: Thought content normal. Thought content is not paranoid or delusional. Thought content does not include homicidal or suicidal ideation. Thought content does not include homicidal or suicidal plan.        Cognition and Memory: Cognition and memory normal.        Judgment: Judgment normal.     Comments: Insight intact. No delusions.      Lab Review:     Component Value Date/Time   NA 140 12/13/2018 1158   K 4.0 12/13/2018 1158   CL 102 12/13/2018 1158   CO2 29 12/13/2018 1158   GLUCOSE 111 (H) 04/18/2019 0821   BUN 23 12/13/2018 1158   CREATININE 0.70 08/07/2019 0947   CALCIUM 10.0 12/13/2018 1158   PROT 7.6 12/13/2018 1158   ALBUMIN 4.7 12/13/2018 1158   AST 22 12/13/2018 1158   ALT 39 (H) 04/18/2019 0821    ALKPHOS 82 12/13/2018 1158   BILITOT 0.5 12/13/2018 1158       Component Value Date/Time   WBC 4.6 12/13/2018 1158   RBC 4.47 12/13/2018 1158   HGB 13.5 12/13/2018 1158   HCT 40.6 12/13/2018 1158   PLT 258.0 12/13/2018 1158   MCV 90.6 12/13/2018 1158   MCHC 33.3 12/13/2018 1158   RDW 13.7 12/13/2018 1158   LYMPHSABS 1.4 12/13/2018 1158   MONOABS 0.4 12/13/2018 1158   EOSABS 0.0 12/13/2018 1158   BASOSABS 0.0 12/13/2018 1158    No results found for: POCLITH, LITHIUM   No results found for: PHENYTOIN, PHENOBARB, VALPROATE, CBMZ   .res Assessment: Plan:   Patient seen for 30 minutes and greater than 50% of visit spent counseling patient regarding possible treatment options to include increase in sertraline, increase in Rexulti, or adding a low dose Lithium since patient exhibits signs and symptoms of reactive depression and some studies suggest that lithium can be an effective treatment for reactive depression.  Patient reports that she would prefer to increase sertraline.  Discussed potential benefits, risks, and side effects of increasing sertraline to 250 mg daily and discussed that sertraline is off label at this dose since max amount approved by the FDA is 200 mg daily.  Patient agrees to starting higher dose of sertraline. Also counseled patient regarding taking Klonopin 1 mg 3 times daily over the weekend to help stabilize acute anxiety and prevent further escalation of panic signs and symptoms. Discussed potential benefits of EMDR since patient describes having worsening anxiety when leaving someone that she cares deeply about and encouraged patient to discuss possibility of targeted EMDR with current therapist. Patient to follow-up in 4 weeks or sooner if clinically indicated. Patient advised to contact office with any questions, adverse effects, or acute  worsening in signs and symptoms.  Amy Mcintyre was seen today for anxiety and depression.  Diagnoses and all orders for this  visit:  Generalized anxiety disorder -     sertraline (ZOLOFT) 100 MG tablet; Take 2.5 tablets (250 mg total) by mouth daily. -     clonazePAM (KLONOPIN) 1 MG tablet; TAKE 1/2 TO 1 (ONE-HALF TO ONE) TABLET BY MOUTH THREE TIMES DAILY AS NEEDED FOR PANIC ATTACK OR INSOMNIA -     brexpiprazole (REXULTI) 2 MG TABS tablet; Take 1 tablet (2 mg total) by mouth daily.  Moderate episode of recurrent major depressive disorder (HCC) -     sertraline (ZOLOFT) 100 MG tablet; Take 2.5 tablets (250 mg total) by mouth daily. -     brexpiprazole (REXULTI) 2 MG TABS tablet; Take 1 tablet (2 mg total) by mouth daily.  Primary insomnia -     clonazePAM (KLONOPIN) 1 MG tablet; TAKE 1/2 TO 1 (ONE-HALF TO ONE) TABLET BY MOUTH THREE TIMES DAILY AS NEEDED FOR PANIC ATTACK OR INSOMNIA     Please see After Visit Summary for patient specific instructions.  Future Appointments  Date Time Provider [place]  10/04/2019  5:00 PM Thayer Headings, PMHNP [facility] None    No orders of the defined types were placed in this encounter.   -------------------------------

## 2019-09-11 ENCOUNTER — Other Ambulatory Visit: Payer: Self-pay | Admitting: Gastroenterology

## 2019-09-24 ENCOUNTER — Other Ambulatory Visit: Payer: Self-pay

## 2019-09-24 MED ORDER — METFORMIN HCL 500 MG PO TABS: ORAL_TABLET | ORAL | 0 refills | Status: DC

## 2019-10-04 ENCOUNTER — Other Ambulatory Visit: Payer: Self-pay

## 2019-10-04 ENCOUNTER — Ambulatory Visit (INDEPENDENT_AMBULATORY_CARE_PROVIDER_SITE_OTHER): Payer: PRIVATE HEALTH INSURANCE | Admitting: Psychiatry

## 2019-10-04 ENCOUNTER — Encounter: Payer: Self-pay | Admitting: Psychiatry

## 2019-10-04 VITALS — BP 117/79 | HR 82 | Wt 145.0 lb

## 2019-10-04 DIAGNOSIS — F5101 Primary insomnia: Secondary | ICD-10-CM | POA: Diagnosis not present

## 2019-10-04 DIAGNOSIS — F411 Generalized anxiety disorder: Secondary | ICD-10-CM

## 2019-10-04 DIAGNOSIS — F331 Major depressive disorder, recurrent, moderate: Secondary | ICD-10-CM | POA: Diagnosis not present

## 2019-10-04 MED ORDER — METFORMIN HCL 500 MG PO TABS: ORAL_TABLET | ORAL | 3 refills | Status: DC

## 2019-10-04 MED ORDER — SERTRALINE HCL 100 MG PO TABS: 250.0000 mg | ORAL_TABLET | Freq: Every day | ORAL | 2 refills | Status: DC

## 2019-10-04 MED ORDER — BREXPIPRAZOLE 2 MG PO TABS: 2.0000 mg | ORAL_TABLET | Freq: Every day | ORAL | 3 refills | Status: DC

## 2019-10-04 NOTE — Progress Notes (Signed)
Amy Mcintyre [id number] 08/16/1966 53 y.o.  Subjective:   Patient ID:  Amy Mcintyre is a 53 y.o. (DOB [birth year]) female.  Chief Complaint:  Chief Complaint  Patient presents with  . Follow-up    Depression, Anxiety, Insomnia    HPI Amy Mcintyre presents to the office today for follow-up of depression, anxiety, and insomnia. "I'm better than when I saw you last time." She reports that she has not had any severe panic attacks. She reports that she will take an additional klonopin when needed, such as when she is in situations that can trigger anxiety/panic, such as driving on road trips. She reports that she has days where she is feeling more anxious and uneasy. She reports that at those times she will seek support from friends. Notices that her anxiety is triggered by leaving somewhere and going to her home and the thought of being alone. She has been going somewhere every weekend. She reports that she has some baseline low-level depression. Has not been having crying spells. She reports that the winter months are typically worse for her depression. She reports that her sleep has been ok. She is able to fall asleep without difficulty. Will awaken a few times a night and able to return to sleep easily. Denies any change in app or food intake. Concentration has been ok except for days when she has increased anxiety. Denies SI.   Daughter will be moving in June when her husband is re-assigned.    Past Psychiatric Medication Trials: Paxil Prozac Sertraline Nortriptyline Doxepin BuSpar Abilify-increased heart rate Seroquel-adverse effects Gabapentin Lunesta-not effective Ambien-parasomnias Trazodone-ineffective Belsomra-worsening insomnia Klonopin   Review of Systems:  Review of Systems  Gastrointestinal:       Has noticed she has increased n/v and diarrhea when anxiety is higher  Musculoskeletal: Negative for gait problem.       She reports some increased TMJ pain. Has not been  able to see chiropractor for TMJ  Neurological: Negative for tremors.  Psychiatric/Behavioral:       Please refer to HPI    Medications: I have reviewed the patient's current medications.  Current Outpatient Medications  Medication Sig Dispense Refill  . atorvastatin (LIPITOR) 40 MG tablet Take 40 mg by mouth daily.    . baclofen (LIORESAL) 20 MG tablet 20 mg 3 (three) times daily. TAKES BID    . cholecalciferol (VITAMIN D) 1000 units tablet Take 1,000 Units by mouth daily.    . clonazePAM (KLONOPIN) 1 MG tablet TAKE 1/2 TO 1 (ONE-HALF TO ONE) TABLET BY MOUTH THREE TIMES DAILY AS NEEDED FOR PANIC ATTACK OR INSOMNIA 90 tablet 1  . Lactobacillus (PROBIOTIC ACIDOPHILUS PO) Take by mouth.    . levothyroxine (SYNTHROID, LEVOTHROID) 75 MCG tablet Take 75 mcg by mouth daily.     . meloxicam (MOBIC) 7.5 MG tablet Take 7.5 mg by mouth 2 (two) times daily as needed.    . metFORMIN (GLUCOPHAGE) 500 MG tablet TAKE 1 TABLET TWICE DAILY WITH MEALS IF TOLERATED 60 tablet 3  . pantoprazole (PROTONIX) 40 MG tablet Take 1 tablet by mouth once daily 90 tablet 0  . sertraline (ZOLOFT) 100 MG tablet Take 2.5 tablets (250 mg total) by mouth daily. 75 tablet 2  . brexpiprazole (REXULTI) 2 MG TABS tablet Take 1 tablet (2 mg total) by mouth daily. 30 tablet 3  . hydrocortisone (ANUSOL-HC) 25 MG suppository Place 1 suppository (25 mg total) rectally 2 (two) times daily. 28 suppository 0   No current facility-administered medications  for this visit.     Medication Side Effects: None  Allergies:  Allergies  Allergen Reactions  . Ambien [Zolpidem Tartrate]   . Erythromycin Swelling  . Penicillins Swelling    Past Medical History:  Diagnosis Date  . Anxiety   . Cataracts, bilateral   . Colitis   . Depression   . Eosinophilic esophagitis   . Fatty liver   . Gastritis   . GERD (gastroesophageal reflux disease)   . Hyperlipidemia   . Thyroid disease   . TMJ (dislocation of temporomandibular joint)      Family History  Problem Relation Age of Onset  . Anxiety disorder Mother   . Panic disorder Mother   . OCD Mother   . Anxiety disorder Father   . Depression Father   . Colon polyps Father   . Depression Maternal Grandfather   . Depression Paternal Grandmother   . Drug abuse Brother   . Colon cancer Neg Hx   . Esophageal cancer Neg Hx   . Pancreatic cancer Neg Hx   . Stomach cancer Neg Hx   . Liver disease Neg Hx   . Rectal cancer Neg Hx     Social History   Socioeconomic History  . Marital status: Single    Spouse name: Not on file  . Number of children: 1  . Years of education: Not on file  . Highest education level: Not on file  Occupational History  . Occupation: MANAGER  Social Needs  . Financial resource strain: Not on file  . Food insecurity    Worry: Not on file    Inability: Not on file  . Transportation needs    Medical: Not on file    Non-medical: Not on file  Tobacco Use  . Smoking status: Never Smoker  . Smokeless tobacco: Never Used  Substance and Sexual Activity  . Alcohol use: Yes    Comment: socially  . Drug use: Never  . Sexual activity: Not on file  Lifestyle  . Physical activity    Days per week: Not on file    Minutes per session: Not on file  . Stress: Not on file  Relationships  . Social Herbalist on phone: Not on file    Gets together: Not on file    Attends religious service: Not on file    Active member of club or organization: Not on file    Attends meetings of clubs or organizations: Not on file    Relationship status: Not on file  . Intimate partner violence    Fear of current or ex partner: Not on file    Emotionally abused: Not on file    Physically abused: Not on file    Forced sexual activity: Not on file  Other Topics Concern  . Not on file  Social History Narrative  . Not on file    Past Medical History, Surgical history, Social history, and Family history were reviewed and updated as appropriate.    Please see review of systems for further details on the patient's review from today.   Objective:   Physical Exam:  BP 117/79   Pulse 82   Wt 145 lb (65.8 kg)   BMI 29.29 kg/m   Physical Exam Constitutional:      General: She is not in acute distress.    Appearance: She is well-developed.  Musculoskeletal:        General: No deformity.  Neurological:  Mental Status: She is alert and oriented to person, place, and time.     Coordination: Coordination normal.  Psychiatric:        Attention and Perception: Attention and perception normal. She does not perceive auditory or visual hallucinations.        Mood and Affect: Mood normal. Mood is not anxious or depressed. Affect is not labile, blunt, angry or inappropriate.        Speech: Speech normal.        Behavior: Behavior normal.        Thought Content: Thought content normal. Thought content does not include homicidal or suicidal ideation. Thought content does not include homicidal or suicidal plan.        Cognition and Memory: Cognition and memory normal.        Judgment: Judgment normal.     Comments: Insight intact. No delusions.      Lab Review:     Component Value Date/Time   NA 140 12/13/2018 1158   K 4.0 12/13/2018 1158   CL 102 12/13/2018 1158   CO2 29 12/13/2018 1158   GLUCOSE 111 (H) 04/18/2019 0821   BUN 23 12/13/2018 1158   CREATININE 0.70 08/07/2019 0947   CALCIUM 10.0 12/13/2018 1158   PROT 7.6 12/13/2018 1158   ALBUMIN 4.7 12/13/2018 1158   AST 22 12/13/2018 1158   ALT 39 (H) 04/18/2019 0821   ALKPHOS 82 12/13/2018 1158   BILITOT 0.5 12/13/2018 1158       Component Value Date/Time   WBC 4.6 12/13/2018 1158   RBC 4.47 12/13/2018 1158   HGB 13.5 12/13/2018 1158   HCT 40.6 12/13/2018 1158   PLT 258.0 12/13/2018 1158   MCV 90.6 12/13/2018 1158   MCHC 33.3 12/13/2018 1158   RDW 13.7 12/13/2018 1158   LYMPHSABS 1.4 12/13/2018 1158   MONOABS 0.4 12/13/2018 1158   EOSABS 0.0 12/13/2018 1158    BASOSABS 0.0 12/13/2018 1158    No results found for: POCLITH, LITHIUM   No results found for: PHENYTOIN, PHENOBARB, VALPROATE, CBMZ   .res Assessment: Plan:   Will continue current plan of care since mood, anxiety, and insomnia s/s are well controlled without tolerability issues.  Recommend continuing psychotherapy with Rodena Goldmann, [place]. Pt to f/u in 3 months or sooner if clinically indicated. Patient advised to contact office with any questions, adverse effects, or acute worsening in signs and symptoms.  [place] was seen today for follow-up.  Diagnoses and all orders for this visit:  Primary insomnia  Generalized anxiety disorder -     brexpiprazole (REXULTI) 2 MG TABS tablet; Take 1 tablet (2 mg total) by mouth daily. -     sertraline (ZOLOFT) 100 MG tablet; Take 2.5 tablets (250 mg total) by mouth daily.  Moderate episode of recurrent major depressive disorder (HCC) -     brexpiprazole (REXULTI) 2 MG TABS tablet; Take 1 tablet (2 mg total) by mouth daily. -     sertraline (ZOLOFT) 100 MG tablet; Take 2.5 tablets (250 mg total) by mouth daily.  Other orders -     metFORMIN (GLUCOPHAGE) 500 MG tablet; TAKE 1 TABLET TWICE DAILY WITH MEALS IF TOLERATED     Please see After Visit Summary for patient specific instructions.  Future Appointments  Date Time Provider [place]  10/18/2019  3:40 PM [place], MD [facility]  01/01/2020  5:00 PM Thayer Headings, PMHNP [facility] None    No orders of the defined types were placed in this  encounter.   -------------------------------

## 2019-10-04 NOTE — Progress Notes (Signed)
   10/04/19 1726  Facial and Oral Movements  Muscles of Facial Expression 0  Lips and Perioral Area 0  Jaw 0  Tongue 0  Extremity Movements  Upper (arms, wrists, hands, fingers) 0  Lower (legs, knees, ankles, toes) 0  Trunk Movements  Neck, shoulders, hips 0  Overall Severity  Severity of abnormal movements (highest score from questions above) 0  Incapacitation due to abnormal movements 0  Patient's awareness of abnormal movements (rate only patient's report) 0  AIMS Total Score  AIMS Total Score 0

## 2019-10-18 ENCOUNTER — Ambulatory Visit (INDEPENDENT_AMBULATORY_CARE_PROVIDER_SITE_OTHER): Payer: PRIVATE HEALTH INSURANCE | Admitting: Gastroenterology

## 2019-10-18 ENCOUNTER — Encounter: Payer: Self-pay | Admitting: Gastroenterology

## 2019-10-18 ENCOUNTER — Other Ambulatory Visit: Payer: Self-pay

## 2019-10-18 VITALS — BP 110/78 | HR 78 | Temp 97.2°F | Ht 59.0 in | Wt 144.1 lb

## 2019-10-18 DIAGNOSIS — K625 Hemorrhage of anus and rectum: Secondary | ICD-10-CM

## 2019-10-18 DIAGNOSIS — R932 Abnormal findings on diagnostic imaging of liver and biliary tract: Secondary | ICD-10-CM | POA: Diagnosis not present

## 2019-10-18 DIAGNOSIS — R140 Abdominal distension (gaseous): Secondary | ICD-10-CM | POA: Diagnosis not present

## 2019-10-18 MED ORDER — HYDROCORTISONE ACETATE 25 MG RE SUPP: 25.0000 mg | Freq: Two times a day (BID) | RECTAL | 0 refills | Status: DC

## 2019-10-18 NOTE — Progress Notes (Signed)
Referring Provider: Tobe Sos, MD Primary Care Physician:  Tobe Sos, MD  Chief complaint:  Bloating, abnormal liver on CT   IMPRESSION:  Worsened bloating and abdominal distension without etiology on EGD, colonoscopy or CT    - occurred with concurrent weight gain    - Patient attributes to stress    - now improved Chronic diarrhea with intermittent bloating x 1 year, now improved    - likely due to hypothyroidism +/- functional diarrhea    - testing for celiac negative, giardia    - normal fecal calprotectin    - negative colon and duodenal biopsies    - not improved by probiotics    - breath test negative for SIBO by hydrogen or methane    - symptoms resolved with regulation of thyroid Abnormal imaging of the liver    - CT of the abdomen and pelvis with contrast 04/18/2019: 2.7 cm focal lesion was identified in segment 8 of the liver       -  left parametrial vein which may be seen with pelvic venous congestion syndrome.      - MRI  04/24/2019:  CT abnormality in segment 7 most likely to be a focal nodular hyperplasia. A bone island was also seen.    - MRI with Eovist 08/07/2019: stable 1.6 x 2.2 cm enhancing lesion in segment 7 that is thought to be a hepatic adenoma. 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  She is no longer concerned about abdominal bloating.  I  reviewed the results of her food allergy testing however this has not changed her dietary habits.  I have encouraged her to keep a food diary.  Reviewed her recent MRI results suggesting a small adenoma.  She does not use any estrogen containing medications or anabolic androgens.  Would consider imaging every 6 6 to 12 months or earlier with any new symptoms.  As long as the adenoma is stable no further intervention is needed.  Intermittent bleeding attributed to internal hemorrhoids.  We reviewed management including high-fiber diet, drink plenty of water, a daily stool bulking agent such as psyllium, and local therapy as needed.  She may call at any time to schedule hemorrhoidal banding.  A brochure was provided to her with additional information today.   PLAN: - High fiber diet recommended, drink at least 1.5-2 liters of water - Daily stool bulking agent with psyllium or methylcellulose recommended - Anusol HC 2.5% applied sparingly PR BID, may apply to a suppository - She may call to schedule hemorrhoidal banding at any time. - Work to maintain a healthy weight including diet modification and exercise - Colonoscopy 2024 - Return to this office PRN, although annual follow-up encouraged given the fatty liver diagnosis    HPI: Amy Mcintyre is a 53 y.o. female with recent hypthyroid associated diarrhea and concerns of bloating.  Last appointment 05/01/2019.  Interval history is obtained through the patient and review of her electronic health record.  She is currently doing well.  Feels like most of her GI symptoms were related to her poorly controlled hypothyroidism.  Allergy testing with Dr. Ladona Mow 07/02/2019 revealed cows milk and string being allergies. Bloating is better. She didn't find that she didn't have much cow's mild or stringbeans. She does not use cow's milk products. No change in symptoms if she eats stringbeans.  She thinks the bloating may be largely driven by stress.  Having one  soft bowel movement. No straining. Sense of complete evacuation. Most worried about the bleeding from her hemorrhoids.  Uses Anusol HC suppositories as needed.   A CT of the abdomen and pelvis with contrast 04/18/2019 showed no acute findings.  A 2.7 cm focal lesion was identified in segment 8 of the liver.  There is an increased left parametrial vein which may be seen with pelvic venous congestion syndrome.  A subsequent MRI  04/24/2019 showed the CT abnormality in segment 7 most likely to be a focal nodular hyperplasia.  The radiologist recommended pre-and postcontrast abdominal MRI using Eovist in 3 to 6 months.  No acute abdominal process was seen. A bone island was also seen.  A follow-up MRI with Eovist was performed 08/07/2019 revealing a stable 1.6 x 2.2 cm enhancing lesion in segment 7 that is thought to be a hepatic adenoma.  No new findings were identified.  Recent labs: Labs 04/18/2019 show a glucose of 111, ALT 39, bilirubin 0.4, GGT 8.2, iron 102, ferritin 129, IgG 811, haptoglobin 176, ANA negative, hepatitis B surface antigen negative hep B core antibody IgM nonreactive, hepatitis C antibody nonreactive.  Serum insulin was 16.6.  A calculated Homa score was 4.5. Fibro-test showed a fibrosis stage of 0, necroinflammatory grade A0 to A1.   Past Medical History:  Diagnosis Date   Anxiety    Cataracts, bilateral    Colitis    Depression    Eosinophilic esophagitis    Fatty liver    Gastritis    GERD (gastroesophageal reflux disease)    Hyperlipidemia    Thyroid disease    TMJ (dislocation of temporomandibular joint)     Past Surgical History:  Procedure Laterality Date   BREAST ENHANCEMENT SURGERY     CESAREAN SECTION     NASAL SINUS SURGERY     tummy tuck     WRIST SURGERY Left     Current Outpatient Medications  Medication Sig Dispense Refill   atorvastatin (LIPITOR) 40 MG tablet Take 40 mg by mouth daily.     baclofen (LIORESAL) 20 MG tablet 20 mg 3  (three) times daily. TAKES BID     brexpiprazole (REXULTI) 2 MG TABS tablet Take 1 tablet (2 mg total) by mouth daily. 30 tablet 3   cholecalciferol (VITAMIN D) 1000 units tablet Take 1,000 Units by mouth daily.     clonazePAM (KLONOPIN) 1 MG tablet TAKE 1/2 TO 1 (ONE-HALF TO ONE) TABLET BY MOUTH THREE TIMES DAILY AS NEEDED FOR PANIC ATTACK OR INSOMNIA 90 tablet 1   hydrocortisone (ANUSOL-HC) 25 MG suppository Place 1 suppository (25 mg total) rectally 2 (two) times daily. 28 suppository 0   Lactobacillus (PROBIOTIC ACIDOPHILUS PO) Take by mouth.     levothyroxine (SYNTHROID, LEVOTHROID) 75 MCG tablet Take 75 mcg by mouth daily.      meloxicam (MOBIC) 7.5 MG tablet Take 7.5 mg by mouth 2 (two) times daily as needed.  metFORMIN (GLUCOPHAGE) 500 MG tablet TAKE 1 TABLET TWICE DAILY WITH MEALS IF TOLERATED 60 tablet 3   pantoprazole (PROTONIX) 40 MG tablet Take 1 tablet by mouth once daily 90 tablet 0   sertraline (ZOLOFT) 100 MG tablet Take 2.5 tablets (250 mg total) by mouth daily. 75 tablet 2   No current facility-administered medications for this visit.     Allergies as of 10/18/2019 - Review Complete 10/18/2019  Allergen Reaction Noted   Ambien [zolpidem tartrate]  09/22/2018   Erythromycin Swelling 09/22/2018   Penicillins Swelling 09/22/2018    Family History  Problem Relation Age of Onset   Anxiety disorder Mother    Panic disorder Mother    OCD Mother    Anxiety disorder Father    Depression Father    Colon polyps Father    Depression Maternal Grandfather    Depression Paternal Grandmother    Drug abuse Brother    Colon cancer Neg Hx    Esophageal cancer Neg Hx    Pancreatic cancer Neg Hx    Stomach cancer Neg Hx    Liver disease Neg Hx    Rectal cancer Neg Hx     Social History   Socioeconomic History   Marital status: Single    Spouse name: Not on file   Number of children: 1   Years of education: Not on file   Highest  education level: Not on file  Occupational History   Occupation: Soil scientist strain: Not on file   Food insecurity    Worry: Not on file    Inability: Not on file   Transportation needs    Medical: Not on file    Non-medical: Not on file  Tobacco Use   Smoking status: Never Smoker   Smokeless tobacco: Never Used  Substance and Sexual Activity   Alcohol use: Yes    Comment: socially   Drug use: Never   Sexual activity: Not on file  Lifestyle   Physical activity    Days per week: Not on file    Minutes per session: Not on file   Stress: Not on file  Relationships   Social connections    Talks on phone: Not on file    Gets together: Not on file    Attends religious service: Not on file    Active member of club or organization: Not on file    Attends meetings of clubs or organizations: Not on file    Relationship status: Not on file   Intimate partner violence    Fear of current or ex partner: Not on file    Emotionally abused: Not on file    Physically abused: Not on file    Forced sexual activity: Not on file  Other Topics Concern   Not on file  Social History Narrative   Not on file    Review of Systems: ALL ROS discussed and all others negative except listed in HPI.  Physical Exam: General: in no acute distress. Appears more relaxed since our last visit.  Neuro: Alert and appropriate Psych: Normal affect and normal insight Exam otherwise limited due to telehealth encounter   [place]. Tarri Glenn, MD, MPH [facility] 10/18/2019, 3:50 PM

## 2019-10-18 NOTE — Patient Instructions (Signed)
-   [employer]gihealth.com and UpToDate.com have good information about hemorrhoids. - Use the Anusol HC and apply it to a glycerin suppository when you are having problems. Please do not use them every day for more than two weeks.  - Use Metamucil or a similar stool bulking agent to insure soft, formed stools.  - If the suppositories are not controlling your bleeding we can consider banding. Call me with any concerns.

## 2019-11-26 ENCOUNTER — Telehealth: Payer: Self-pay | Admitting: Psychiatry

## 2019-11-26 ENCOUNTER — Other Ambulatory Visit: Payer: Self-pay | Admitting: Psychiatry

## 2019-11-26 DIAGNOSIS — F411 Generalized anxiety disorder: Secondary | ICD-10-CM

## 2019-11-26 DIAGNOSIS — F5101 Primary insomnia: Secondary | ICD-10-CM

## 2019-11-26 NOTE — Telephone Encounter (Signed)
Apt 01/26

## 2019-11-26 NOTE — Telephone Encounter (Signed)
Clarification [facility] states they do not cover her plan and to contact [employer] at [phone] Will contact them to submit a prior authorization for sertraline.

## 2019-11-26 NOTE — Telephone Encounter (Signed)
Clonazepam Rx already submitted to pharmacy  Prior authorization for Sertraline 100 mg 2.5 tablets daily submitted to [facility]

## 2019-11-26 NOTE — Telephone Encounter (Signed)
Pt called stated pharmacy advised her need new Rx for Clonazepam and Sertraline  provider needs  to call  [facility]. ([place]?) Pt # [phone]

## 2019-11-27 ENCOUNTER — Other Ambulatory Visit: Payer: Self-pay

## 2019-11-27 DIAGNOSIS — F331 Major depressive disorder, recurrent, moderate: Secondary | ICD-10-CM

## 2019-11-27 DIAGNOSIS — F411 Generalized anxiety disorder: Secondary | ICD-10-CM

## 2019-11-27 MED ORDER — SERTRALINE HCL 100 MG PO TABS: 200.0000 mg | ORAL_TABLET | Freq: Every day | ORAL | 2 refills | Status: DC

## 2019-11-27 NOTE — Telephone Encounter (Signed)
Patient aware and instructed to call back with any concerns. Will update her Rx at pharmacy

## 2019-11-27 NOTE — Telephone Encounter (Signed)
Contacted [employer], they faxed prior authorization form to be completed and are requiring the last 2 office notes be faxed as well to review a PA for sertraline. Will submit all information today

## 2019-12-18 ENCOUNTER — Other Ambulatory Visit: Payer: Self-pay | Admitting: Psychiatry

## 2019-12-18 DIAGNOSIS — F5101 Primary insomnia: Secondary | ICD-10-CM

## 2019-12-18 DIAGNOSIS — F411 Generalized anxiety disorder: Secondary | ICD-10-CM

## 2019-12-19 NOTE — Telephone Encounter (Signed)
Refill not due till next week, has apt 01/01/2020

## 2020-01-01 ENCOUNTER — Ambulatory Visit: Payer: PRIVATE HEALTH INSURANCE | Admitting: Psychiatry

## 2020-01-14 ENCOUNTER — Telehealth: Payer: Self-pay | Admitting: Psychiatry

## 2020-01-14 NOTE — Telephone Encounter (Signed)
Pt would like to start on 1mg  of Rexulti instead of 2mg . She is doing so much better on 1mg . Pt wants to know if its ok.

## 2020-01-14 NOTE — Telephone Encounter (Signed)
Patient aware and has plenty 2 mg to use. Advised to call back with any concerns.

## 2020-01-29 ENCOUNTER — Encounter: Payer: Self-pay | Admitting: Psychiatry

## 2020-01-29 ENCOUNTER — Ambulatory Visit (INDEPENDENT_AMBULATORY_CARE_PROVIDER_SITE_OTHER): Payer: PRIVATE HEALTH INSURANCE | Admitting: Psychiatry

## 2020-01-29 ENCOUNTER — Other Ambulatory Visit: Payer: Self-pay

## 2020-01-29 DIAGNOSIS — F411 Generalized anxiety disorder: Secondary | ICD-10-CM | POA: Diagnosis not present

## 2020-01-29 DIAGNOSIS — F331 Major depressive disorder, recurrent, moderate: Secondary | ICD-10-CM

## 2020-01-29 DIAGNOSIS — F5101 Primary insomnia: Secondary | ICD-10-CM | POA: Diagnosis not present

## 2020-01-29 MED ORDER — SERTRALINE HCL 100 MG PO TABS: 200.0000 mg | ORAL_TABLET | Freq: Every day | ORAL | 2 refills | Status: DC

## 2020-01-29 MED ORDER — CLONAZEPAM 1 MG PO TABS: ORAL_TABLET | ORAL | 2 refills | Status: DC

## 2020-01-29 MED ORDER — METFORMIN HCL 500 MG PO TABS: ORAL_TABLET | ORAL | 3 refills | Status: DC

## 2020-01-29 NOTE — Progress Notes (Signed)
   01/29/20 1730  Facial and Oral Movements  Muscles of Facial Expression 0  Lips and Perioral Area 0  Jaw 0  Tongue 0  Extremity Movements  Upper (arms, wrists, hands, fingers) 0  Lower (legs, knees, ankles, toes) 0  Trunk Movements  Neck, shoulders, hips 0  Overall Severity  Severity of abnormal movements (highest score from questions above) 0  Incapacitation due to abnormal movements 0  Patient's awareness of abnormal movements (rate only patient's report) 0  AIMS Total Score  AIMS Total Score 0

## 2020-01-29 NOTE — Progress Notes (Signed)
[name] [id number] Oct 04, 1966 54 y.o.  Subjective:   Patient ID:  Amy Mcintyre is a 54 y.o. (DOB 07/21/1966) female.  Chief Complaint:  Chief Complaint  Patient presents with  . Follow-up    h/o Depression, Anxiety, and insomnia    HPI Amy Mcintyre presents to the office today for follow-up of depression, anxiety, and insomnia. She reports that she is doing well overall and cc is wt gain. She reports that she has not been able to walk due to inclement weather and yoga studio has been closed with the pandemic. Reports that she has not been eating a significant amount and has not been snacking and eating only a limited number of meals. Reports that she would like to try to decrease carbohydrates. She reports that she has anxiety at times at work and will feel overwhelmed and will then have to take a step back. Occ notices heart racing or feeling in pit of stomach. Denies any panic attacks.  PCP started her on Gabapentin for TMJ and may not be needing Klonopin prn as much since starting it.   Decreased Sertraline to 200 mg in late December and decreased Rexulti on 01/14/20 and has not noticed any worsening s/s with these decreases. She reports that she has not had any recent crying episodes. She notices her mood and anxiety is worse after those close to her leave after a visit. Has occ been working remotely from her daughter's or boyfriend's. Has anxiety when she is alone.   Sleep has been ok. Awakens once during the night to use the bathroom and then returns to sleep. Wakes up naturally and estimates sleeping 6-7 hours a night. Energy and motivation have been good. Concentration has been good. Denies SI.   Amy Mcintyre has been visiting her since 2/9 and will be leaving this weekend. She reports that their relationship continues to go well. Daughter will be moving to [place] in June.   Takes Klonopin at [facility] and 1/2 tab prn some days.   Has been working some in person and some remotely.   Past  Psychiatric Medication Trials: Paxil Prozac Sertraline Nortriptyline Doxepin BuSpar Abilify-increased heart rate Seroquel-adverse effects Gabapentin Lunesta-not effective Ambien-parasomnias Trazodone-ineffective Belsomra-worsening insomnia Klonopin   AIMS     Office Visit from 01/29/2020 in [facility] Visit from 10/04/2019 in [facility]  AIMS Total Score  0  0       Review of Systems:  Review of Systems  Respiratory: Negative for apnea.        Has been snoring at night.   Endocrine:       Reports that 2 medical providers have informed her that she is pre-diabetic and agreed with her taking Metformin   Musculoskeletal: Positive for arthralgias. Negative for gait problem.       Has had increased jaw pain and has not been able to see chiropractor. PCP started Gabapentin.  Neurological: Negative for tremors.  Psychiatric/Behavioral:       Please refer to HPI    Medications: I have reviewed the patient's current medications.  Current Outpatient Medications  Medication Sig Dispense Refill  . atorvastatin (LIPITOR) 40 MG tablet Take 40 mg by mouth daily.    . baclofen (LIORESAL) 20 MG tablet 20 mg 3 (three) times daily. TAKES BID    . cholecalciferol (VITAMIN D) 1000 units tablet Take 1,000 Units by mouth daily.    . clonazePAM (KLONOPIN) 1 MG tablet TAKE 1/2 TO 1 (ONE-HALF TO ONE) TABLET BY  MOUTH THREE TIMES DAILY AS NEEDED FOR  PANIC  ATTACK  OR  INSOMNIA 90 tablet 2  . gabapentin (NEURONTIN) 300 MG capsule Take 300 mg by mouth 3 (three) times daily.    . Lactobacillus (PROBIOTIC ACIDOPHILUS PO) Take by mouth.    . levothyroxine (SYNTHROID, LEVOTHROID) 75 MCG tablet Take 75 mcg by mouth daily.     . metFORMIN (GLUCOPHAGE) 500 MG tablet TAKE 1 TABLET TWICE DAILY WITH MEALS IF TOLERATED 60 tablet 3  . pantoprazole (PROTONIX) 40 MG tablet Take 1 tablet by mouth once daily 90 tablet 0  . sertraline (ZOLOFT) 100 MG tablet Take 2  tablets (200 mg total) by mouth daily. 60 tablet 2  . hydrocortisone (ANUSOL-HC) 25 MG suppository Place 1 suppository (25 mg total) rectally 2 (two) times daily. 28 suppository 0   No current facility-administered medications for this visit.    Medication Side Effects: None  Allergies:  Allergies  Allergen Reactions  . Ambien [Zolpidem Tartrate]   . Erythromycin Swelling  . Penicillins Swelling    Past Medical History:  Diagnosis Date  . Anxiety   . Cataracts, bilateral   . Colitis   . Depression   . Eosinophilic esophagitis   . Fatty liver   . Gastritis   . GERD (gastroesophageal reflux disease)   . Hyperlipidemia   . Thyroid disease   . TMJ (dislocation of temporomandibular joint)     Family History  Problem Relation Age of Onset  . Anxiety disorder Mother   . Panic disorder Mother   . OCD Mother   . Anxiety disorder Father   . Depression Father   . Colon polyps Father   . Depression Maternal Grandfather   . Depression Paternal Grandmother   . Drug abuse Brother   . Colon cancer Neg Hx   . Esophageal cancer Neg Hx   . Pancreatic cancer Neg Hx   . Stomach cancer Neg Hx   . Liver disease Neg Hx   . Rectal cancer Neg Hx     Social History   Socioeconomic History  . Marital status: Single    Spouse name: Not on file  . Number of children: 1  . Years of education: Not on file  . Highest education level: Not on file  Occupational History  . Occupation: MANAGER  Tobacco Use  . Smoking status: Never Smoker  . Smokeless tobacco: Never Used  Substance and Sexual Activity  . Alcohol use: Yes    Comment: socially  . Drug use: Never  . Sexual activity: Not on file  Other Topics Concern  . Not on file  Social History Narrative  . Not on file   Social Determinants of Health   Financial Resource Strain:   . Difficulty of Paying Living Expenses: Not on file  Food Insecurity:   . Worried About Charity fundraiser in the Last Year: Not on file  . Ran Out  of Food in the Last Year: Not on file  Transportation Needs:   . Lack of Transportation (Medical): Not on file  . Lack of Transportation (Non-Medical): Not on file  Physical Activity:   . Days of Exercise per Week: Not on file  . Minutes of Exercise per Session: Not on file  Stress:   . Feeling of Stress : Not on file  Social Connections:   . Frequency of Communication with Friends and Family: Not on file  . Frequency of Social Gatherings with Friends and Family: Not on file  .  Attends Religious Services: Not on file  . Active Member of Clubs or Organizations: Not on file  . Attends Archivist Meetings: Not on file  . Marital Status: Not on file  Intimate Partner Violence:   . Fear of Current or Ex-Partner: Not on file  . Emotionally Abused: Not on file  . Physically Abused: Not on file  . Sexually Abused: Not on file    Past Medical History, Surgical history, Social history, and Family history were reviewed and updated as appropriate.   Please see review of systems for further details on the patient's review from today.   Objective:  EKG WNL on 11/20/19.  Physical Exam:  Wt 150 lb (68 kg)   BMI 30.30 kg/m   Physical Exam Constitutional:      General: She is not in acute distress.    Appearance: She is well-developed.  Musculoskeletal:        General: No deformity.  Neurological:     Mental Status: She is alert and oriented to person, place, and time.     Coordination: Coordination normal.  Psychiatric:        Attention and Perception: Attention and perception normal. She does not perceive auditory or visual hallucinations.        Mood and Affect: Mood normal. Mood is not anxious or depressed. Affect is not labile, blunt, angry or inappropriate.        Speech: Speech normal.        Behavior: Behavior normal.        Thought Content: Thought content normal. Thought content is not paranoid or delusional. Thought content does not include homicidal or suicidal  ideation. Thought content does not include homicidal or suicidal plan.        Cognition and Memory: Cognition and memory normal.        Judgment: Judgment normal.     Comments: Insight intact     Labs from 12/11/19: TSH WNL AST 44 ALT 61 Vit D 36.8 Fasting glucose 111 Reports triglycerides and Hgb A1C were elevated and otherwise cholesterol was WNL.  Lab Review:     Component Value Date/Time   NA 140 12/13/2018 1158   K 4.0 12/13/2018 1158   CL 102 12/13/2018 1158   CO2 29 12/13/2018 1158   GLUCOSE 111 (H) 04/18/2019 0821   BUN 23 12/13/2018 1158   CREATININE 0.70 08/07/2019 0947   CALCIUM 10.0 12/13/2018 1158   PROT 7.6 12/13/2018 1158   ALBUMIN 4.7 12/13/2018 1158   AST 22 12/13/2018 1158   ALT 39 (H) 04/18/2019 0821   ALKPHOS 82 12/13/2018 1158   BILITOT 0.5 12/13/2018 1158       Component Value Date/Time   WBC 4.6 12/13/2018 1158   RBC 4.47 12/13/2018 1158   HGB 13.5 12/13/2018 1158   HCT 40.6 12/13/2018 1158   PLT 258.0 12/13/2018 1158   MCV 90.6 12/13/2018 1158   MCHC 33.3 12/13/2018 1158   RDW 13.7 12/13/2018 1158   LYMPHSABS 1.4 12/13/2018 1158   MONOABS 0.4 12/13/2018 1158   EOSABS 0.0 12/13/2018 1158   BASOSABS 0.0 12/13/2018 1158    No results found for: POCLITH, LITHIUM   No results found for: PHENYTOIN, PHENOBARB, VALPROATE, CBMZ   .res Assessment: Plan:   Discussed discontinuing Rexulti due to potential metabolic side effects including taking and now having borderline diabetes..  Discussed that her mood and anxiety have been more stable and addition of gabapentin for TMJ may also help with anxiety.  Patient agrees with plan to stop Rexulti. Continue sertraline 200 mg daily for anxiety and depression. Continue Klonopin as needed for anxiety and insomnia. Continue Metformin 500 mg twice daily for antipsychotic induced insulin resistance. Patient to follow-up in 4 months or sooner if clinically indicated. Patient advised to contact office with any  questions, adverse effects, or acute worsening in signs and symptoms.  Albert was seen today for follow-up.  Diagnoses and all orders for this visit:  Generalized anxiety disorder -     sertraline (ZOLOFT) 100 MG tablet; Take 2 tablets (200 mg total) by mouth daily. -     clonazePAM (KLONOPIN) 1 MG tablet; TAKE 1/2 TO 1 (ONE-HALF TO ONE) TABLET BY MOUTH THREE TIMES DAILY AS NEEDED FOR  PANIC  ATTACK  OR  INSOMNIA  Moderate episode of recurrent major depressive disorder (HCC) -     sertraline (ZOLOFT) 100 MG tablet; Take 2 tablets (200 mg total) by mouth daily.  Primary insomnia -     clonazePAM (KLONOPIN) 1 MG tablet; TAKE 1/2 TO 1 (ONE-HALF TO ONE) TABLET BY MOUTH THREE TIMES DAILY AS NEEDED FOR  PANIC  ATTACK  OR  INSOMNIA  Other orders -     metFORMIN (GLUCOPHAGE) 500 MG tablet; TAKE 1 TABLET TWICE DAILY WITH MEALS IF TOLERATED     Please see After Visit Summary for patient specific instructions.  Future Appointments  Date Time Provider [place]  05/20/2020  5:00 PM Thayer Headings, PMHNP CP-CP None    No orders of the defined types were placed in this encounter.   -------------------------------

## 2020-05-08 ENCOUNTER — Other Ambulatory Visit: Payer: Self-pay

## 2020-05-08 MED ORDER — METFORMIN HCL 500 MG PO TABS: ORAL_TABLET | ORAL | 0 refills | Status: DC

## 2020-05-08 NOTE — Telephone Encounter (Signed)
Patient requesting 90 day refill for Metformin 500 mg 1 tablet bid with meals as tolerated. #180 sent to [place], [place]

## 2020-05-20 ENCOUNTER — Ambulatory Visit (INDEPENDENT_AMBULATORY_CARE_PROVIDER_SITE_OTHER): Payer: PRIVATE HEALTH INSURANCE | Admitting: Psychiatry

## 2020-05-20 ENCOUNTER — Encounter: Payer: Self-pay | Admitting: Psychiatry

## 2020-05-20 ENCOUNTER — Other Ambulatory Visit: Payer: Self-pay

## 2020-05-20 DIAGNOSIS — F411 Generalized anxiety disorder: Secondary | ICD-10-CM

## 2020-05-20 DIAGNOSIS — F331 Major depressive disorder, recurrent, moderate: Secondary | ICD-10-CM | POA: Diagnosis not present

## 2020-05-20 MED ORDER — SERTRALINE HCL 100 MG PO TABS: 200.0000 mg | ORAL_TABLET | Freq: Every day | ORAL | 3 refills | Status: DC

## 2020-05-20 NOTE — Progress Notes (Signed)
Amy Mcintyre [id number] 1966/11/13 54 y.o.  Subjective:   Patient ID:  Amy Mcintyre is a 54 y.o. (DOB 02-28-1966) female.  Chief Complaint:  Chief Complaint  Patient presents with  . Follow-up    h/o anxiety, depression, and insomnia    HPI Amy Mcintyre presents to the office today for follow-up of anxiety, depression, and insomnia. She reports that she has had some increased anxiety the last few weeks and some depression. Daughter has moved to [place] and is not sure when she will be able to see her again. She reports that daughter moving has been difficult for her. She reports that she and [name] had a "set back" and this has affected her confidence in the relationship and triggered some insecurities. She reports that [name] brought up things from their past. She reports that she had severe anxiety the following week and had panic attacks and vomiting. She called her therapist for an emergency visit and talked with therapist. "I kind of felt like I had some PTSD like response." She reports that she has had some worry and negative self-talk. She reports that she had some panic s/s and GI s/s this weekend while with [name]. Did not have any panic or GI s/s the last 2 days. Reports anxiety has been "ok today." She reports that she has had some sadness. She reports that she has been waking up frequently. Appetite has been decreased. Energy and motivation have been low. She reports that she has had muscle tension in shoulders and tightness in her jaw. Concentration was difficult at work last week. Denies SI.    Past Psychiatric Medication Trials: Paxil Prozac Sertraline Nortriptyline Doxepin BuSpar Abilify-increased heart rate Seroquel-adverse effects Gabapentin Lunesta-not effective Ambien-parasomnias Trazodone-ineffective Belsomra-worsening insomnia Klonopin  AIMS     Office Visit from 01/29/2020 in [facility] Visit from 10/04/2019 in [facility]   AIMS Total Score 0 0       Review of Systems:  Review of Systems  Musculoskeletal: Negative for gait problem.       Muscle tension and jaw tightness  Neurological: Negative for tremors.  Psychiatric/Behavioral:       Please refer to HPI    Medications: I have reviewed the patient's current medications.  Current Outpatient Medications  Medication Sig Dispense Refill  . atorvastatin (LIPITOR) 40 MG tablet Take 40 mg by mouth daily.    . baclofen (LIORESAL) 20 MG tablet 20 mg 3 (three) times daily. TAKES BID    . cholecalciferol (VITAMIN D) 1000 units tablet Take 1,000 Units by mouth daily.    . clonazePAM (KLONOPIN) 1 MG tablet TAKE 1/2 TO 1 (ONE-HALF TO ONE) TABLET BY MOUTH THREE TIMES DAILY AS NEEDED FOR  PANIC  ATTACK  OR  INSOMNIA 90 tablet 2  . gabapentin (NEURONTIN) 300 MG capsule Take 300 mg by mouth 3 (three) times daily.    . hydrocortisone (ANUSOL-HC) 25 MG suppository Place 1 suppository (25 mg total) rectally 2 (two) times daily. 28 suppository 0  . Lactobacillus (PROBIOTIC ACIDOPHILUS PO) Take by mouth.    . levothyroxine (SYNTHROID, LEVOTHROID) 75 MCG tablet Take 75 mcg by mouth daily.     . metFORMIN (GLUCOPHAGE) 500 MG tablet TAKE 1 TABLET TWICE DAILY WITH MEALS IF TOLERATED 180 tablet 0  . pantoprazole (PROTONIX) 40 MG tablet Take 1 tablet by mouth once daily 90 tablet 0  . sertraline (ZOLOFT) 100 MG tablet Take 2 tablets (200 mg total) by mouth daily. 60 tablet 3  No current facility-administered medications for this visit.    Medication Side Effects: None  Allergies:  Allergies  Allergen Reactions  . Ambien [Zolpidem Tartrate]   . Erythromycin Swelling  . Penicillins Swelling    Past Medical History:  Diagnosis Date  . Anxiety   . Cataracts, bilateral   . Colitis   . Depression   . Eosinophilic esophagitis   . Fatty liver   . Gastritis   . GERD (gastroesophageal reflux disease)   . Hyperlipidemia   . Thyroid disease   . TMJ (dislocation of  temporomandibular joint)     Family History  Problem Relation Age of Onset  . Anxiety disorder Mother   . Panic disorder Mother   . OCD Mother   . Anxiety disorder Father   . Depression Father   . Colon polyps Father   . Depression Maternal Grandfather   . Depression Paternal Grandmother   . Drug abuse Brother   . Colon cancer Neg Hx   . Esophageal cancer Neg Hx   . Pancreatic cancer Neg Hx   . Stomach cancer Neg Hx   . Liver disease Neg Hx   . Rectal cancer Neg Hx     Social History   Socioeconomic History  . Marital status: Single    Spouse name: Not on file  . Number of children: 1  . Years of education: Not on file  . Highest education level: Not on file  Occupational History  . Occupation: MANAGER  Tobacco Use  . Smoking status: Never Smoker  . Smokeless tobacco: Never Used  Vaping Use  . Vaping Use: Never used  Substance and Sexual Activity  . Alcohol use: Yes    Comment: socially  . Drug use: Never  . Sexual activity: Not on file  Other Topics Concern  . Not on file  Social History Narrative  . Not on file   Social Determinants of Health   Financial Resource Strain:   . Difficulty of Paying Living Expenses:   Food Insecurity:   . Worried About Charity fundraiser in the Last Year:   . Arboriculturist in the Last Year:   Transportation Needs:   . Film/video editor (Medical):   Marland Kitchen Lack of Transportation (Non-Medical):   Physical Activity:   . Days of Exercise per Week:   . Minutes of Exercise per Session:   Stress:   . Feeling of Stress :   Social Connections:   . Frequency of Communication with Friends and Family:   . Frequency of Social Gatherings with Friends and Family:   . Attends Religious Services:   . Active Member of Clubs or Organizations:   . Attends Archivist Meetings:   Marland Kitchen Marital Status:   Intimate Partner Violence:   . Fear of Current or Ex-Partner:   . Emotionally Abused:   Marland Kitchen Physically Abused:   . Sexually  Abused:     Past Medical History, Surgical history, Social history, and Family history were reviewed and updated as appropriate.   Please see review of systems for further details on the patient's review from today.   Objective:   Physical Exam:  Wt 147 lb (66.7 kg)   BMI 29.69 kg/m   Physical Exam Constitutional:      General: She is not in acute distress. Musculoskeletal:        General: No deformity.  Neurological:     Mental Status: She is alert and oriented to person, place,  and time.     Coordination: Coordination normal.  Psychiatric:        Attention and Perception: Attention and perception normal. She does not perceive auditory or visual hallucinations.        Mood and Affect: Affect is not labile, blunt, angry or inappropriate.        Speech: Speech normal.        Behavior: Behavior normal.        Thought Content: Thought content normal. Thought content is not paranoid or delusional. Thought content does not include homicidal or suicidal ideation. Thought content does not include homicidal or suicidal plan.        Cognition and Memory: Cognition and memory normal.        Judgment: Judgment normal.     Comments: Insight intact Mood is appropriate to content. Affect is congruent     Lab Review:     Component Value Date/Time   NA 140 12/13/2018 1158   K 4.0 12/13/2018 1158   CL 102 12/13/2018 1158   CO2 29 12/13/2018 1158   GLUCOSE 111 (H) 04/18/2019 0821   BUN 23 12/13/2018 1158   CREATININE 0.70 08/07/2019 0947   CALCIUM 10.0 12/13/2018 1158   PROT 7.6 12/13/2018 1158   ALBUMIN 4.7 12/13/2018 1158   AST 22 12/13/2018 1158   ALT 39 (H) 04/18/2019 0821   ALKPHOS 82 12/13/2018 1158   BILITOT 0.5 12/13/2018 1158       Component Value Date/Time   WBC 4.6 12/13/2018 1158   RBC 4.47 12/13/2018 1158   HGB 13.5 12/13/2018 1158   HCT 40.6 12/13/2018 1158   PLT 258.0 12/13/2018 1158   MCV 90.6 12/13/2018 1158   MCHC 33.3 12/13/2018 1158   RDW 13.7  12/13/2018 1158   LYMPHSABS 1.4 12/13/2018 1158   MONOABS 0.4 12/13/2018 1158   EOSABS 0.0 12/13/2018 1158   BASOSABS 0.0 12/13/2018 1158    No results found for: POCLITH, LITHIUM   No results found for: PHENYTOIN, PHENOBARB, VALPROATE, CBMZ   .res Assessment: Plan:   Pt seen for 30 minutes and time spent counseling pt re: recent stressors and treatment options. Pt reports that she prefers to continue current medications without changes since she feels that medications are adequately treating target s/s and that current s/s are related to situational stressors. She reports that she plans to meet with her therapist more frequently in response to recent stressors. Will continue current plan of care. Continue Klonopin as needed for anxiety and insomnia.  Continue Sertraline 200 mg po qd for depression and anxiety.  Pt to f/u in 4 months or sooner if clinically indicated. Patient advised to contact office with any questions, adverse effects, or acute worsening in signs and symptoms.  Malary was seen today for follow-up.  Diagnoses and all orders for this visit:  Generalized anxiety disorder -     sertraline (ZOLOFT) 100 MG tablet; Take 2 tablets (200 mg total) by mouth daily.  Moderate episode of recurrent major depressive disorder (HCC) -     sertraline (ZOLOFT) 100 MG tablet; Take 2 tablets (200 mg total) by mouth daily.     Please see After Visit Summary for patient specific instructions.  Future Appointments  Date Time Provider [place]  09/23/2020  5:00 PM Thayer Headings, PMHNP [facility] None    No orders of the defined types were placed in this encounter.   -------------------------------

## 2020-05-28 ENCOUNTER — Telehealth: Payer: Self-pay | Admitting: Psychiatry

## 2020-05-28 NOTE — Telephone Encounter (Signed)
Patient called and said that last night she took an extra klonopin and today she had a drug test for work and she is worried that she will be fired. Also she would like to talk to [place] about her anxiety. She said at her last visit she told Janett Billow about things going on in her life and how her anxiety is getting worse. Please call her to discuss at [phone]

## 2020-05-29 NOTE — Telephone Encounter (Signed)
Contacted pt and discussed that her reported amount taken was still within parameters of how medication is prescribed. Discussed that letter could be provided if needed that she is prescribed Klonopin.

## 2020-07-28 ENCOUNTER — Other Ambulatory Visit: Payer: Self-pay | Admitting: Psychiatry

## 2020-07-28 DIAGNOSIS — F5101 Primary insomnia: Secondary | ICD-10-CM

## 2020-07-28 DIAGNOSIS — F411 Generalized anxiety disorder: Secondary | ICD-10-CM

## 2020-07-29 NOTE — Telephone Encounter (Signed)
Next apt 10/19

## 2020-08-06 LAB — HM PAP SMEAR: HM Pap smear: NORMAL

## 2020-08-06 LAB — HM MAMMOGRAPHY

## 2020-08-25 ENCOUNTER — Other Ambulatory Visit: Payer: Self-pay | Admitting: Psychiatry

## 2020-08-25 DIAGNOSIS — F411 Generalized anxiety disorder: Secondary | ICD-10-CM

## 2020-08-25 DIAGNOSIS — F5101 Primary insomnia: Secondary | ICD-10-CM

## 2020-08-26 NOTE — Telephone Encounter (Signed)
Clonazepam okay monthly?

## 2020-09-23 ENCOUNTER — Ambulatory Visit: Payer: PRIVATE HEALTH INSURANCE | Admitting: Psychiatry

## 2020-09-24 ENCOUNTER — Other Ambulatory Visit: Payer: Self-pay | Admitting: Psychiatry

## 2020-09-24 DIAGNOSIS — F5101 Primary insomnia: Secondary | ICD-10-CM

## 2020-09-24 DIAGNOSIS — F411 Generalized anxiety disorder: Secondary | ICD-10-CM

## 2020-09-26 NOTE — Telephone Encounter (Signed)
Has upcoming apt with jessica 10/26  Last refill 08/28/20

## 2020-09-30 ENCOUNTER — Encounter: Payer: Self-pay | Admitting: Psychiatry

## 2020-09-30 ENCOUNTER — Other Ambulatory Visit: Payer: Self-pay

## 2020-09-30 ENCOUNTER — Ambulatory Visit (INDEPENDENT_AMBULATORY_CARE_PROVIDER_SITE_OTHER): Payer: PRIVATE HEALTH INSURANCE | Admitting: Psychiatry

## 2020-09-30 VITALS — Wt 137.0 lb

## 2020-09-30 DIAGNOSIS — F5101 Primary insomnia: Secondary | ICD-10-CM | POA: Diagnosis not present

## 2020-09-30 DIAGNOSIS — F331 Major depressive disorder, recurrent, moderate: Secondary | ICD-10-CM | POA: Diagnosis not present

## 2020-09-30 DIAGNOSIS — F411 Generalized anxiety disorder: Secondary | ICD-10-CM

## 2020-09-30 MED ORDER — CLONAZEPAM 1 MG PO TABS: ORAL_TABLET | ORAL | 2 refills | Status: DC

## 2020-09-30 MED ORDER — SERTRALINE HCL 100 MG PO TABS: 200.0000 mg | ORAL_TABLET | Freq: Every day | ORAL | 3 refills | Status: DC

## 2020-09-30 NOTE — Progress Notes (Signed)
Amy Mcintyre [id number] 25-Oct-1966 54 y.o.  Subjective:   Patient ID:  Amy Mcintyre is a 54 y.o. (DOB 04/25/1966) female.  Chief Complaint:  Chief Complaint  Patient presents with  . Anxiety  . Depression  . Sleeping Problem    HPI Amy Mcintyre presents to the office today for follow-up of depression, anxiety, and insomnia. She reports that she has had increased anxiety and depression with crying spells. Anxiety has been worse over the summer and the fall. She reports that she has had significant work stress with 6 people leaving at her job and she is assuming some of these responsibilities. She has been working longer hours as result of increased demands at work and reports "it's on my mind constantly." She and her therapist made a plan for her not to work past 7:30 pm or on weekends. She reports that she is having occasional crying episodes at work. Has been vomiting in the morning and having diarrhea and this tends to be worse when anxious about work.Denies full blown panic attacks. She reports that she has generalized anxiety.  She reports that she is not sleeping more than about 2 hours consecutively. She has had some early morning awakenings at 3 am and unable to return to sleep. She reports that her sleep has been disrupted for several months. Appetite has been low. Energy and motivation are low. She reports that she has not been able to exercise and reports that it would likely be helpful for her overall health. She reports that she has difficulty starting some tasks and can focus once she initiates a task. Denies SI.   Daughter has moved to [place], [place] and is not happy where she lives. Nephew recently got married and reports that she became emotional at the wedding. She reports that she has had some significant depression after the wedding on 09/20/20. She reports that depression has persisted some. She reports that she and Amy Mcintyre are getting along well. Amy Mcintyre is retiring in January and  planning to move to [place].  Past Psychiatric Medication Trials: Paxil Prozac Sertraline Nortriptyline Doxepin BuSpar Abilify-increased heart rate rexulti-Helpful for mood and anxiety Seroquel-adverse effects Gabapentin Lunesta-not effective Ambien-parasomnias Trazodone-ineffective Belsomra-worsening insomnia Klonopin  AIMS     Office Visit from 01/29/2020 in [facility] Visit from 10/04/2019 in [facility]  AIMS Total Score 0 0       Review of Systems:  Review of Systems  Musculoskeletal: Negative for gait problem.       TMJ pain  Neurological: Negative for tremors.  Psychiatric/Behavioral:       Please refer to HPI    Medications: I have reviewed the patient's current medications.  Current Outpatient Medications  Medication Sig Dispense Refill  . atorvastatin (LIPITOR) 40 MG tablet Take 40 mg by mouth daily.    . baclofen (LIORESAL) 20 MG tablet 20 mg 3 (three) times daily. TAKES BID    . cholecalciferol (VITAMIN D) 1000 units tablet Take 1,000 Units by mouth daily.    Derrill Memo ON 10/24/2020] clonazePAM (KLONOPIN) 1 MG tablet TAKE 1/2 (ONE-HALF) TO 1 TABLET THREE TIMES DAILY AS NEEDED FOR PANIC ATTACK OR INSOMNIA 90 tablet 2  . Lactobacillus (PROBIOTIC ACIDOPHILUS PO) Take by mouth.    . levothyroxine (SYNTHROID, LEVOTHROID) 75 MCG tablet Take 75 mcg by mouth daily.     . metFORMIN (GLUCOPHAGE) 500 MG tablet TAKE 1 TABLET BY MOUTH TWICE DAILY WITH MEALS IF TOLERATED 180 tablet 0  . pantoprazole (PROTONIX)  40 MG tablet Take 1 tablet by mouth once daily 90 tablet 0  . sertraline (ZOLOFT) 100 MG tablet Take 2 tablets (200 mg total) by mouth daily. 60 tablet 3  . gabapentin (NEURONTIN) 300 MG capsule Take 300 mg by mouth 3 (three) times daily between meals as needed.     . hydrocortisone (ANUSOL-HC) 25 MG suppository Place 1 suppository (25 mg total) rectally 2 (two) times daily. 28 suppository 0   No current  facility-administered medications for this visit.    Medication Side Effects: None  Allergies:  Allergies  Allergen Reactions  . Ambien [Zolpidem Tartrate]   . Erythromycin Swelling  . Penicillins Swelling    Past Medical History:  Diagnosis Date  . Anxiety   . Cataracts, bilateral   . Colitis   . Depression   . Eosinophilic esophagitis   . Fatty liver   . Gastritis   . GERD (gastroesophageal reflux disease)   . Hyperlipidemia   . Thyroid disease   . TMJ (dislocation of temporomandibular joint)     Family History  Problem Relation Age of Onset  . Anxiety disorder Mother   . Panic disorder Mother   . OCD Mother   . Anxiety disorder Father   . Depression Father   . Colon polyps Father   . Depression Maternal Grandfather   . Depression Paternal Grandmother   . Drug abuse Brother   . Colon cancer Neg Hx   . Esophageal cancer Neg Hx   . Pancreatic cancer Neg Hx   . Stomach cancer Neg Hx   . Liver disease Neg Hx   . Rectal cancer Neg Hx     Social History   Socioeconomic History  . Marital status: Single    Spouse name: Not on file  . Number of children: 1  . Years of education: Not on file  . Highest education level: Not on file  Occupational History  . Occupation: MANAGER  Tobacco Use  . Smoking status: Never Smoker  . Smokeless tobacco: Never Used  Vaping Use  . Vaping Use: Never used  Substance and Sexual Activity  . Alcohol use: Yes    Comment: socially  . Drug use: Never  . Sexual activity: Not on file  Other Topics Concern  . Not on file  Social History Narrative  . Not on file   Social Determinants of Health   Financial Resource Strain:   . Difficulty of Paying Living Expenses: Not on file  Food Insecurity:   . Worried About Charity fundraiser in the Last Year: Not on file  . Ran Out of Food in the Last Year: Not on file  Transportation Needs:   . Lack of Transportation (Medical): Not on file  . Lack of Transportation (Non-Medical):  Not on file  Physical Activity:   . Days of Exercise per Week: Not on file  . Minutes of Exercise per Session: Not on file  Stress:   . Feeling of Stress : Not on file  Social Connections:   . Frequency of Communication with Friends and Family: Not on file  . Frequency of Social Gatherings with Friends and Family: Not on file  . Attends Religious Services: Not on file  . Active Member of Clubs or Organizations: Not on file  . Attends Archivist Meetings: Not on file  . Marital Status: Not on file  Intimate Partner Violence:   . Fear of Current or Ex-Partner: Not on file  . Emotionally Abused:  Not on file  . Physically Abused: Not on file  . Sexually Abused: Not on file    Past Medical History, Surgical history, Social history, and Family history were reviewed and updated as appropriate.   Please see review of systems for further details on the patient's review from today.   Objective:   Physical Exam:  Wt 137 lb (62.1 kg)   BMI 27.67 kg/m   Physical Exam Constitutional:      General: She is not in acute distress. Musculoskeletal:        General: No deformity.  Neurological:     Mental Status: She is alert and oriented to person, place, and time.     Coordination: Coordination normal.  Psychiatric:        Attention and Perception: Attention and perception normal. She does not perceive auditory or visual hallucinations.        Mood and Affect: Mood is anxious and depressed. Affect is not labile, blunt, angry or inappropriate.        Speech: Speech normal.        Behavior: Behavior normal.        Thought Content: Thought content normal. Thought content is not paranoid or delusional. Thought content does not include homicidal or suicidal ideation. Thought content does not include homicidal or suicidal plan.        Cognition and Memory: Cognition and memory normal.        Judgment: Judgment normal.     Comments: Insight intact     Lab Review:     Component  Value Date/Time   NA 140 12/13/2018 1158   K 4.0 12/13/2018 1158   CL 102 12/13/2018 1158   CO2 29 12/13/2018 1158   GLUCOSE 111 (H) 04/18/2019 0821   BUN 23 12/13/2018 1158   CREATININE 0.70 08/07/2019 0947   CALCIUM 10.0 12/13/2018 1158   PROT 7.6 12/13/2018 1158   ALBUMIN 4.7 12/13/2018 1158   AST 22 12/13/2018 1158   ALT 39 (H) 04/18/2019 0821   ALKPHOS 82 12/13/2018 1158   BILITOT 0.5 12/13/2018 1158       Component Value Date/Time   WBC 4.6 12/13/2018 1158   RBC 4.47 12/13/2018 1158   HGB 13.5 12/13/2018 1158   HCT 40.6 12/13/2018 1158   PLT 258.0 12/13/2018 1158   MCV 90.6 12/13/2018 1158   MCHC 33.3 12/13/2018 1158   RDW 13.7 12/13/2018 1158   LYMPHSABS 1.4 12/13/2018 1158   MONOABS 0.4 12/13/2018 1158   EOSABS 0.0 12/13/2018 1158   BASOSABS 0.0 12/13/2018 1158    No results found for: POCLITH, LITHIUM   No results found for: PHENYTOIN, PHENOBARB, VALPROATE, CBMZ   .res Assessment: Plan:   Discussed re-starting Gabapentin since worsening in anxiety and insomnia coincided with pt stopping scheduled Gabapentin when it did not seem to improve TMJ. Discussed that Gabapentin can be increased to 300 mg BID and 600 mg QHS if needed to improve sleep.  Pt reports that she would prefer not to add or change any other medications at this time since she attributes s/s to situational stressors which she plans to address in therapy. Agreed that Rexulti seemed to be the most effective medication for anxiety and mood s/s in the past and could be re-started if current s/s worsen or do not improve.  Continue Sertraline 200 mg po qd for depression and anxiety.  Continue Klonopin 1 mg 1/2-1 tab po TID prn anxiety or insomnia.  Recommend continuing psychotherapy.  Pt to  follow-up in 2 months or sooner if clinically indicated. Patient advised to contact office with any questions, adverse effects, or acute worsening in signs and symptoms.  Amy Mcintyre was seen today for anxiety, depression  and sleeping problem.  Diagnoses and all orders for this visit:  Generalized anxiety disorder -     clonazePAM (KLONOPIN) 1 MG tablet; TAKE 1/2 (ONE-HALF) TO 1 TABLET THREE TIMES DAILY AS NEEDED FOR PANIC ATTACK OR INSOMNIA -     sertraline (ZOLOFT) 100 MG tablet; Take 2 tablets (200 mg total) by mouth daily.  Moderate episode of recurrent major depressive disorder (HCC) -     sertraline (ZOLOFT) 100 MG tablet; Take 2 tablets (200 mg total) by mouth daily.  Primary insomnia -     clonazePAM (KLONOPIN) 1 MG tablet; TAKE 1/2 (ONE-HALF) TO 1 TABLET THREE TIMES DAILY AS NEEDED FOR PANIC ATTACK OR INSOMNIA     Please see After Visit Summary for patient specific instructions.  Future Appointments  Date Time Provider [place]  12/09/2020  4:00 PM Thayer Headings, PMHNP [facility] None    No orders of the defined types were placed in this encounter.   -------------------------------

## 2020-10-20 ENCOUNTER — Other Ambulatory Visit: Payer: Self-pay | Admitting: Psychiatry

## 2020-12-09 ENCOUNTER — Ambulatory Visit (INDEPENDENT_AMBULATORY_CARE_PROVIDER_SITE_OTHER): Payer: PRIVATE HEALTH INSURANCE | Admitting: Psychiatry

## 2020-12-09 ENCOUNTER — Encounter: Payer: Self-pay | Admitting: Psychiatry

## 2020-12-09 ENCOUNTER — Other Ambulatory Visit: Payer: Self-pay

## 2020-12-09 DIAGNOSIS — F331 Major depressive disorder, recurrent, moderate: Secondary | ICD-10-CM | POA: Diagnosis not present

## 2020-12-09 DIAGNOSIS — F5101 Primary insomnia: Secondary | ICD-10-CM

## 2020-12-09 DIAGNOSIS — F411 Generalized anxiety disorder: Secondary | ICD-10-CM

## 2020-12-09 MED ORDER — SERTRALINE HCL 100 MG PO TABS: 200.0000 mg | ORAL_TABLET | Freq: Every day | ORAL | 3 refills | Status: DC

## 2020-12-09 NOTE — Progress Notes (Signed)
Trude Lucken [id number] Oct 14, 1966 55 y.o.  Subjective:   Patient ID:  Amy Mcintyre is a 55 y.o. (DOB 1966/03/25) female.  Chief Complaint:  Chief Complaint  Patient presents with  . Anxiety    HPI Amy Mcintyre presents to the office today for follow-up of depression, anxiety, and insomnia. She continues to perform several job functions. She reports that she has told her boss that she plans to only work late 2 nights a week on Tuesdays and Thursdays. She is working 13 hours some days.   She reports that she cries frequently at work due to feeling overwhelmed. She has some anxiety in response to stressors. Infrequent panic attacks. Notices mostly generalized anxiety. She has been crying more. She reports that others have commented that she is often negative and she is trying to work on this. Denies persistent sadness. Energy and motivation have been ok. Sleep has been "hit or miss."  Typically awakens a few times a night and usually able to return to sleep. Appetite has been decreased. Concentration is adequate. Denies Si.   Amy Mcintyre has been here since December 17th and he is moving to [place]. She reports that holidays are difficult for her with brother and sister-in-law backing out of Christmas celebration at the last minute. Spent 2 weeks with daughter over Thanksgiving. Was able to see her daughter over Christmas.   Taking Klonopin at bedtime and then one daily as needed. Taking Gabapentin prn for anxiety and jaw clinching.  She reports that she and Amy Mcintyre are doing well.   Past Psychiatric Medication Trials: Paxil Prozac Sertraline Nortriptyline Doxepin BuSpar Abilify-increased heart rate Rexulti-Helpful for mood and anxiety Seroquel-adverse effects Gabapentin Lunesta-not effective Ambien-parasomnias Trazodone-ineffective Belsomra-worsening insomnia Klonopin  AIMS   Flowsheet Row Office Visit from 01/29/2020 in [facility] Office Visit from 10/04/2019 in  [facility]  AIMS Total Score 0 0       Review of Systems:  Review of Systems  Gastrointestinal:       Occ loose stools and vomiting  Musculoskeletal: Negative for gait problem.  Neurological: Negative for tremors.  Psychiatric/Behavioral:       Please refer to HPI    Medications: I have reviewed the patient's current medications.  Current Outpatient Medications  Medication Sig Dispense Refill  . Ascorbic Acid (VITAMIN C) 100 MG tablet Take 100 mg by mouth daily.    Marland Kitchen atorvastatin (LIPITOR) 40 MG tablet Take 40 mg by mouth daily.    . baclofen (LIORESAL) 20 MG tablet 20 mg 3 (three) times daily. TAKES BID    . clonazePAM (KLONOPIN) 1 MG tablet TAKE 1/2 (ONE-HALF) TO 1 TABLET THREE TIMES DAILY AS NEEDED FOR PANIC ATTACK OR INSOMNIA 90 tablet 2  . ELDERBERRY PO Take by mouth.    . gabapentin (NEURONTIN) 300 MG capsule Take 300 mg by mouth 3 (three) times daily between meals as needed.     . Lactobacillus (PROBIOTIC ACIDOPHILUS PO) Take by mouth.    . levothyroxine (SYNTHROID, LEVOTHROID) 75 MCG tablet Take 75 mcg by mouth daily.     . metFORMIN (GLUCOPHAGE) 500 MG tablet TAKE 1 TABLET BY MOUTH TWICE DAILY WITH MEALS (Patient taking differently: Taking 1-2 tabs daily) 180 tablet 0  . Multiple Vitamins-Minerals (ZINC PO) Take by mouth.    . pantoprazole (PROTONIX) 40 MG tablet Take 1 tablet by mouth once daily 90 tablet 0  . hydrocortisone (ANUSOL-HC) 25 MG suppository Place 1 suppository (25 mg total) rectally 2 (two) times daily. 28 suppository  0  . sertraline (ZOLOFT) 100 MG tablet Take 2 tablets (200 mg total) by mouth daily. 60 tablet 3   No current facility-administered medications for this visit.    Medication Side Effects: None  Allergies:  Allergies  Allergen Reactions  . Ambien [Zolpidem Tartrate]   . Erythromycin Swelling  . Penicillins Swelling    Past Medical History:  Diagnosis Date  . Anxiety   . Cataracts, bilateral   . Colitis   .  Depression   . Eosinophilic esophagitis   . Fatty liver   . Gastritis   . GERD (gastroesophageal reflux disease)   . Hyperlipidemia   . Thyroid disease   . TMJ (dislocation of temporomandibular joint)     Family History  Problem Relation Age of Onset  . Anxiety disorder Mother   . Panic disorder Mother   . OCD Mother   . Anxiety disorder Father   . Depression Father   . Colon polyps Father   . Depression Maternal Grandfather   . Depression Paternal Grandmother   . Drug abuse Brother   . Colon cancer Neg Hx   . Esophageal cancer Neg Hx   . Pancreatic cancer Neg Hx   . Stomach cancer Neg Hx   . Liver disease Neg Hx   . Rectal cancer Neg Hx     Social History   Socioeconomic History  . Marital status: Single    Spouse name: Not on file  . Number of children: 1  . Years of education: Not on file  . Highest education level: Not on file  Occupational History  . Occupation: MANAGER  Tobacco Use  . Smoking status: Never Smoker  . Smokeless tobacco: Never Used  Vaping Use  . Vaping Use: Never used  Substance and Sexual Activity  . Alcohol use: Yes    Comment: socially  . Drug use: Never  . Sexual activity: Not on file  Other Topics Concern  . Not on file  Social History Narrative  . Not on file   Social Determinants of Health   Financial Resource Strain: Not on file  Food Insecurity: Not on file  Transportation Needs: Not on file  Physical Activity: Not on file  Stress: Not on file  Social Connections: Not on file  Intimate Partner Violence: Not on file    Past Medical History, Surgical history, Social history, and Family history were reviewed and updated as appropriate.   Please see review of systems for further details on the patient's review from today.   Objective:   Physical Exam:  Wt 134 lb (60.8 kg)   BMI 27.06 kg/m   Physical Exam Constitutional:      General: She is not in acute distress. Musculoskeletal:        General: No deformity.   Neurological:     Mental Status: She is alert and oriented to person, place, and time.     Coordination: Coordination normal.  Psychiatric:        Attention and Perception: Attention and perception normal. She does not perceive auditory or visual hallucinations.        Mood and Affect: Mood is anxious. Mood is not depressed. Affect is not labile, blunt, angry or inappropriate.        Speech: Speech normal.        Behavior: Behavior normal.        Thought Content: Thought content normal. Thought content is not paranoid or delusional. Thought content does not include homicidal or suicidal  ideation. Thought content does not include homicidal or suicidal plan.        Cognition and Memory: Cognition and memory normal.        Judgment: Judgment normal.     Comments: Insight intact     Lab Review:     Component Value Date/Time   NA 140 12/13/2018 1158   K 4.0 12/13/2018 1158   CL 102 12/13/2018 1158   CO2 29 12/13/2018 1158   GLUCOSE 111 (H) 04/18/2019 0821   BUN 23 12/13/2018 1158   CREATININE 0.70 08/07/2019 0947   CALCIUM 10.0 12/13/2018 1158   PROT 7.6 12/13/2018 1158   ALBUMIN 4.7 12/13/2018 1158   AST 22 12/13/2018 1158   ALT 39 (H) 04/18/2019 0821   ALKPHOS 82 12/13/2018 1158   BILITOT 0.5 12/13/2018 1158       Component Value Date/Time   WBC 4.6 12/13/2018 1158   RBC 4.47 12/13/2018 1158   HGB 13.5 12/13/2018 1158   HCT 40.6 12/13/2018 1158   PLT 258.0 12/13/2018 1158   MCV 90.6 12/13/2018 1158   MCHC 33.3 12/13/2018 1158   RDW 13.7 12/13/2018 1158   LYMPHSABS 1.4 12/13/2018 1158   MONOABS 0.4 12/13/2018 1158   EOSABS 0.0 12/13/2018 1158   BASOSABS 0.0 12/13/2018 1158    No results found for: POCLITH, LITHIUM   No results found for: PHENYTOIN, PHENOBARB, VALPROATE, CBMZ   .res Assessment: Plan:   Pt reports that she would like to continue current medications without changes. She reports that most of her anxiety is in response to work stress and is in the  process of setting boundaries and trying to reduce excessive work hours.  Continue Klonopin 1/2-1 tablet three times daily as needed for panic or insomnia.  Continue Sertraline 200 mg po qd for depression and anxiety.  Continue Metformin as recommended by medical providers due to metabolic reasons.  Pt to follow-up in 4 months or sooner if clinically indicated.  Patient advised to contact office with any questions, adverse effects, or acute worsening in signs and symptoms.    Amy Mcintyre was seen today for anxiety.  Diagnoses and all orders for this visit:  Generalized anxiety disorder -     sertraline (ZOLOFT) 100 MG tablet; Take 2 tablets (200 mg total) by mouth daily.  Primary insomnia  Moderate episode of recurrent major depressive disorder (HCC) -     sertraline (ZOLOFT) 100 MG tablet; Take 2 tablets (200 mg total) by mouth daily.     Please see After Visit Summary for patient specific instructions.  Future Appointments  Date Time Provider Department Center  04/14/2021  4:00 PM Corie Chiquito, PMHNP [facility] None    No orders of the defined types were placed in this encounter.   -------------------------------

## 2021-03-27 ENCOUNTER — Other Ambulatory Visit: Payer: Self-pay | Admitting: Psychiatry

## 2021-03-27 DIAGNOSIS — F5101 Primary insomnia: Secondary | ICD-10-CM

## 2021-03-27 DIAGNOSIS — F411 Generalized anxiety disorder: Secondary | ICD-10-CM

## 2021-03-30 ENCOUNTER — Telehealth: Payer: Self-pay | Admitting: Psychiatry

## 2021-03-30 DIAGNOSIS — F330 Major depressive disorder, recurrent, mild: Secondary | ICD-10-CM

## 2021-03-30 NOTE — Telephone Encounter (Signed)
Pt called and left a message stating that she has an appointment in may. However, she went to the endcronologist this week. The doctor suggested prozac. Her thyroid is fine and the dr. [place] Scrape like it is menapause related. She would like to do a med change before she comes in to her appointment in May. Please call her at [phone]

## 2021-03-30 NOTE — Telephone Encounter (Signed)
Please review

## 2021-03-30 NOTE — Telephone Encounter (Signed)
There is no advantage to switching from sertraline to Prozac for any hormonal reason.  The only possible logic might be switching to Effexor because there have been studies suggesting it to be helpful for hormonally related mood symptoms and hot flashes though in reality I do not think it is much different from any other SSRI in that regard.

## 2021-04-02 IMAGING — MR MR ABDOMEN WO/W CM
11 of 21 series · 20 of 48 positions shown · IV contrast (Yes)
Comparison: MRI abdomen dated 04/24/2019

CLINICAL DATA: Follow-up liver lesion

EXAM:
MRI ABDOMEN WITHOUT AND WITH CONTRAST
TECHNIQUE: Multiplanar multisequence MR imaging of the abdomen was performed
both before and after the administration of intravenous contrast.
CONTRAST:  6mL EOVIST GADOXETATE DISODIUM 0.25 MOL/L IV SOLN

[Series 1: 3 plane non · axial · 8.0mm · 0.78mm/px · 1 of 13 slices shown]
[im 1/13]
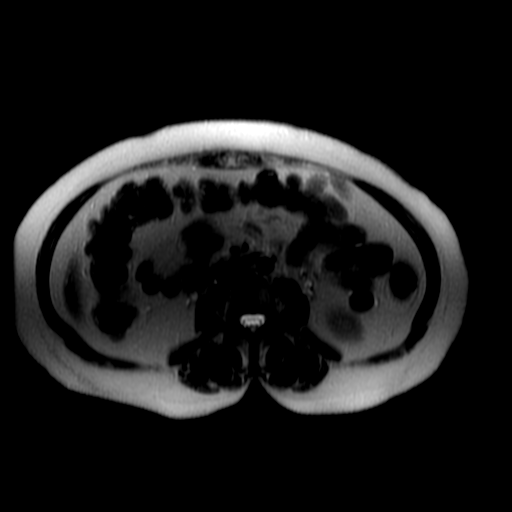

[Series 3: T2 · coronal · 5.0mm · 0.78mm/px · 1 of 45 slices shown (1 of 2)]
[im 1/45]
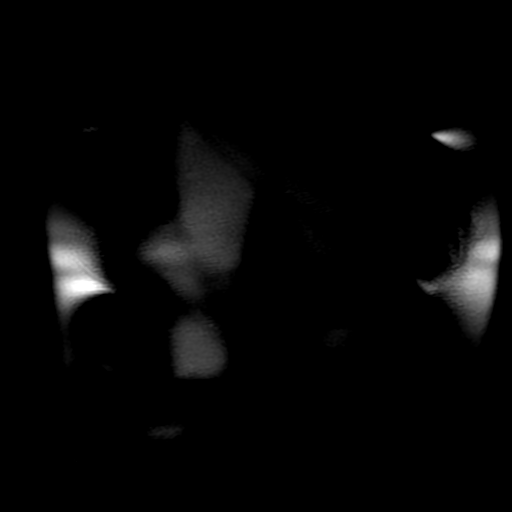

[Series 4: ax dualecho bh · axial · 5.0mm · 0.78mm/px · z∈[-89,+181]mm · 2 of 110 slices shown]
[im 1/110]
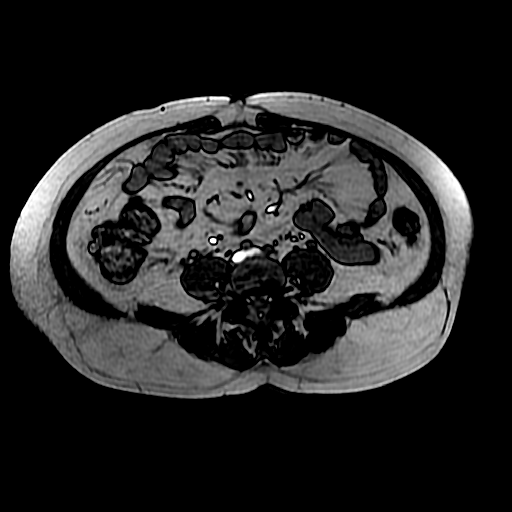
[im 110/110]
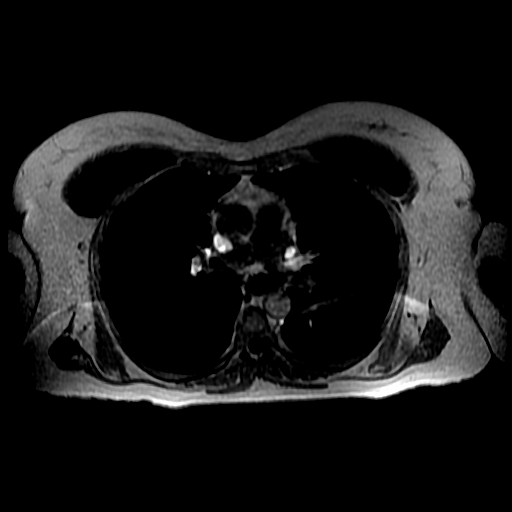

[Series 6: T2 · axial · 5.0mm · 0.78mm/px · 1 of 55 slices shown (2 of 2)]
[im 1/55]
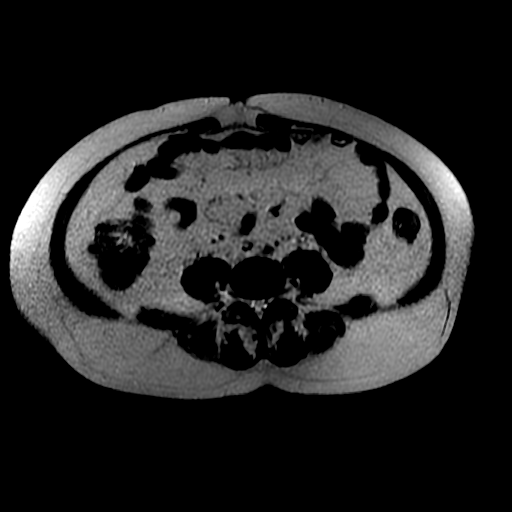

[Series 9: bSSFP fat-sat · axial · 5.0mm · 0.78mm/px · 1 of 55 slices shown]
[im 1/55]
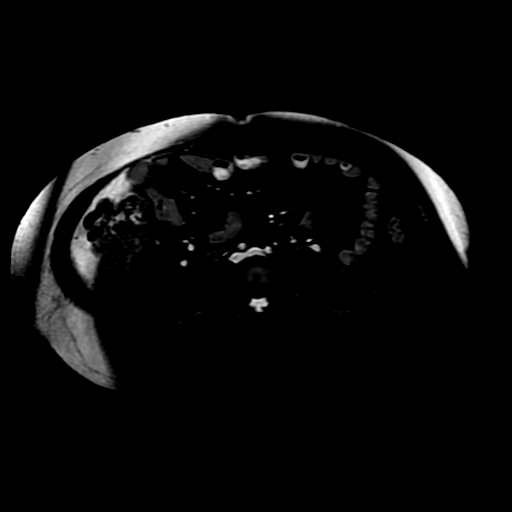

[Series 10: T2 fat-sat · axial · 5.0mm · 0.78mm/px · 1 of 52 slices shown]
[im 1/52]
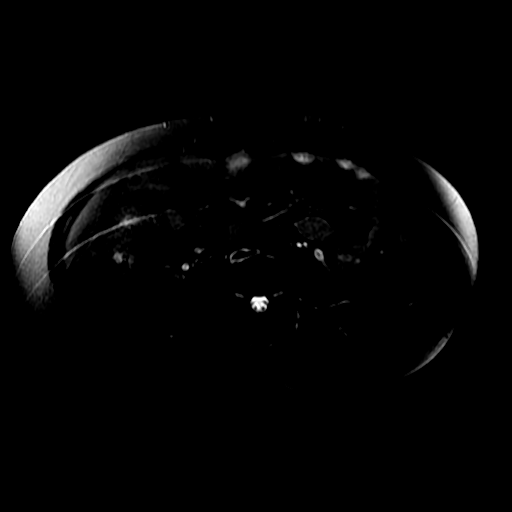

[Series 11: DWI b500 · axial · 6.0mm · 1.48mm/px · z∈[-90,+183]mm · 2 of 72 slices shown]
[im 1/72]
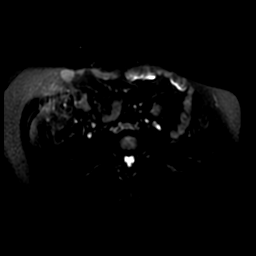
[im 72/72]
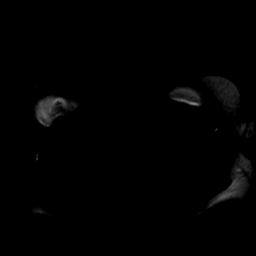

[Series 12: T1 dynamic · coronal · delayed · 5.0mm · 0.86mm/px · 2 of 88 slices shown (1 of 3)]
[im 1/88]
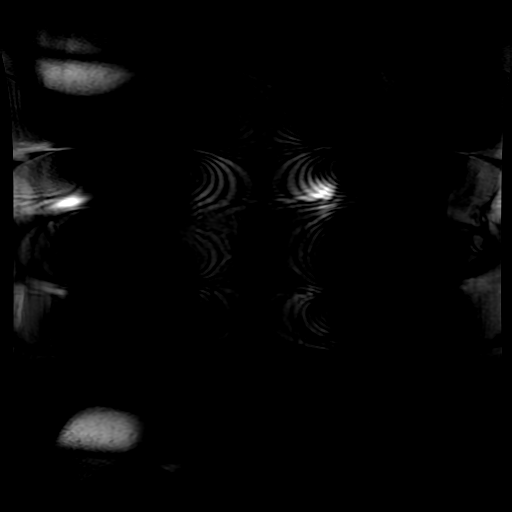
[im 88/88]
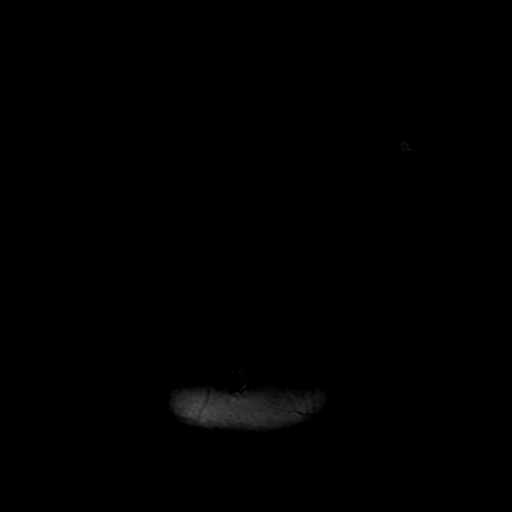

[Series 13: T1 dynamic · axial · delayed · 5.4mm · 0.78mm/px · z∈[-112,+156]mm · 3 of 100 slices shown (2 of 3)]
[im 1/100]
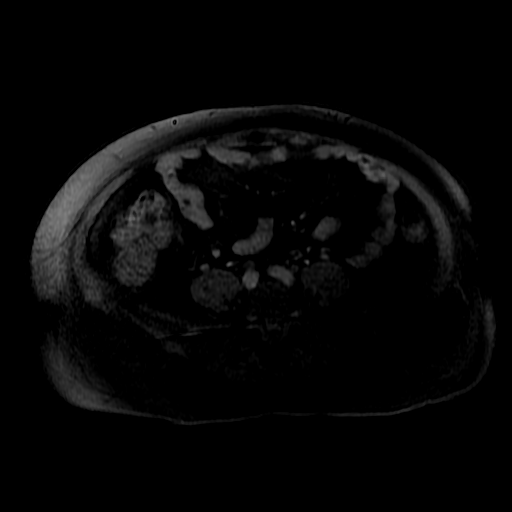
[im 50/100]
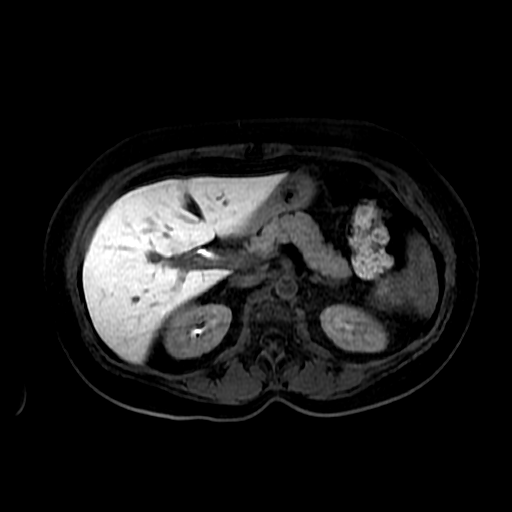
[im 100/100]
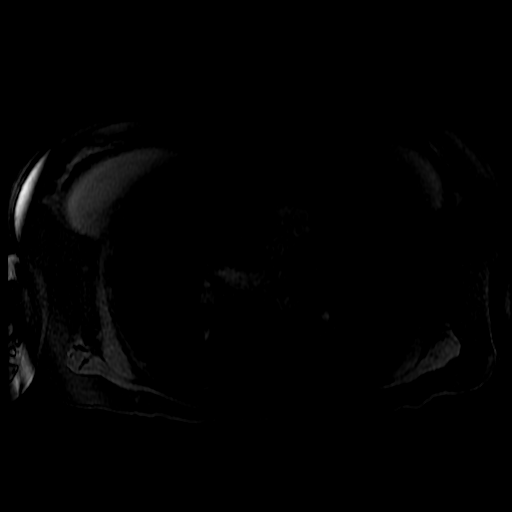

[Series 100: ((id)/(date)..100)-((id)/700/1..100) · axial · 5.4mm · 0.78mm/px · z∈[-112,+156]mm · 3 of 100 slices shown]
[im 1/100]
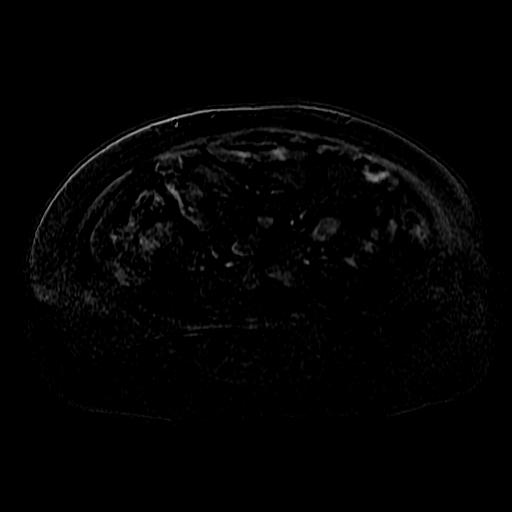
[im 50/100]
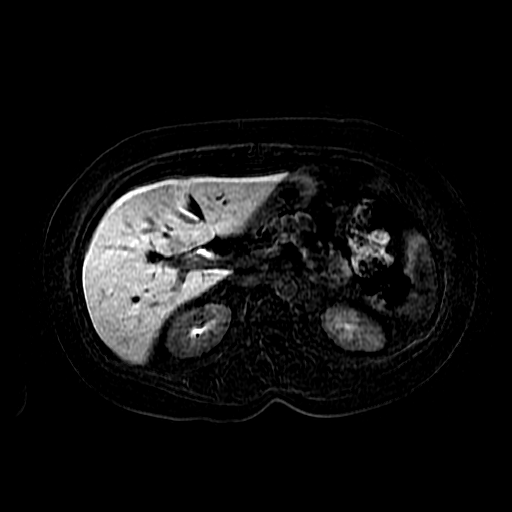
[im 100/100]
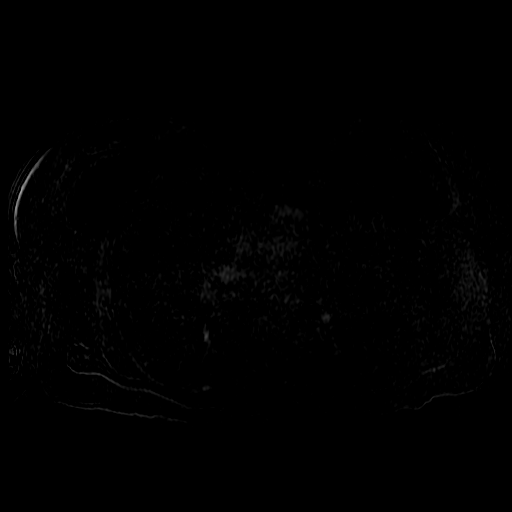

[Series 500: T1 dynamic · axial · 5.4mm · 0.78mm/px · z∈[-112,+156]mm · 3 of 100 slices shown (3 of 3)]
[im 1/100]
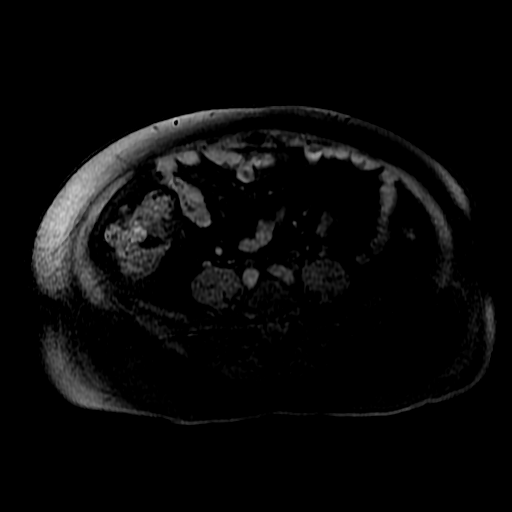
[im 50/100]
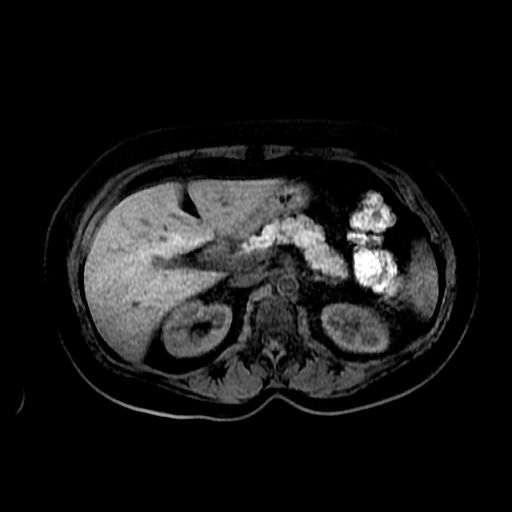
[im 100/100]
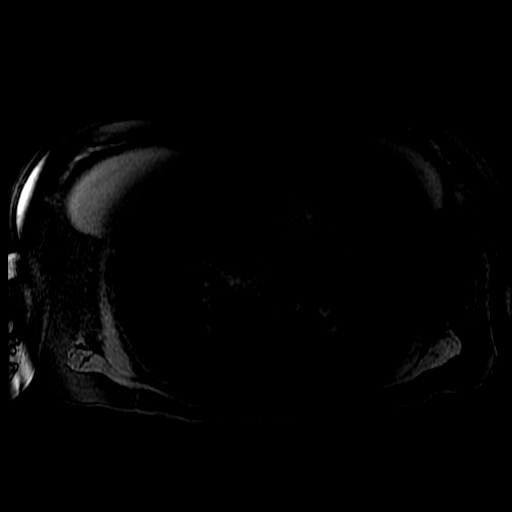

[20 of 48 positions shown; findings below may reference images not displayed]

FINDINGS: Lower chest: Lung bases are clear.  Bilateral breast augmentation.

Hepatobiliary: No morphologic findings of cirrhosis. Mild hepatic
steatosis. 1.6 x 2.2 cm hypervascular lesion in segment 7 (series
704/image 32), without retain Eovist contrast on delayed imaging,
favoring a benign hepatic adenoma.

Gallbladder is unremarkable. No intrahepatic or extrahepatic ductal
dilatation.

Pancreas:  Within normal limits.

Spleen:  Within normal limits

Adrenals/Urinary Tract:  Adrenal glands are within normal limits.

Kidneys are within normal limits.  No hydronephrosis.

Stomach/Bowel: Stomach is within normal limits.

Visualized bowel is unremarkable.

Vascular/Lymphatic:  No evidence of abdominal aortic aneurysm.

No suspicious abdominal lymphadenopathy.

Other:  No abdominal ascites.

Musculoskeletal: No focal osseous lesions.
IMPRESSION: Stable enhancing lesion in segment 7 favors a benign hepatic adenoma
over FNH.

## 2021-04-02 MED ORDER — BUPROPION HCL ER (XL) 150 MG PO TB24: 150.0000 mg | ORAL_TABLET | Freq: Every day | ORAL | 0 refills | Status: DC

## 2021-04-02 NOTE — Addendum Note (Signed)
Addended by: Sharyl Nimrod on: 04/02/2021 04:46 PM   Modules accepted: Orders

## 2021-04-02 NOTE — Telephone Encounter (Addendum)
Returned call to patient. She reports that endocrinologist was saying that Prozac may be more helpful. She reports that she has been crying almost daily and was concerned that her thyroid levels were abnormal. Discussed Dr. Casimiro Needle input and agreed that Prozac is unlikely to be more helpful for mood s/s related to menopause compared to Sertraline. Pt reports that her anxiety has been adequately controlled. Discussed potential benefits, risks, and side effects of Wellbutrin XL and discusssed that this may be helpful for depression and could be taken in combination with Sertraline. Pt agrees to trial of Wellbutrin XL. Script sent for Wellbutrin XL 150 mg daily for depression.

## 2021-04-14 ENCOUNTER — Encounter: Payer: Self-pay | Admitting: Psychiatry

## 2021-04-14 ENCOUNTER — Ambulatory Visit (INDEPENDENT_AMBULATORY_CARE_PROVIDER_SITE_OTHER): Payer: PRIVATE HEALTH INSURANCE | Admitting: Psychiatry

## 2021-04-14 ENCOUNTER — Other Ambulatory Visit: Payer: Self-pay

## 2021-04-14 DIAGNOSIS — F411 Generalized anxiety disorder: Secondary | ICD-10-CM

## 2021-04-14 DIAGNOSIS — F330 Major depressive disorder, recurrent, mild: Secondary | ICD-10-CM | POA: Diagnosis not present

## 2021-04-14 DIAGNOSIS — F5101 Primary insomnia: Secondary | ICD-10-CM

## 2021-04-14 DIAGNOSIS — F331 Major depressive disorder, recurrent, moderate: Secondary | ICD-10-CM | POA: Diagnosis not present

## 2021-04-14 MED ORDER — SERTRALINE HCL 100 MG PO TABS: 200.0000 mg | ORAL_TABLET | Freq: Every day | ORAL | 3 refills | Status: DC

## 2021-04-14 MED ORDER — BUPROPION HCL ER (XL) 150 MG PO TB24: 150.0000 mg | ORAL_TABLET | Freq: Every day | ORAL | 2 refills | Status: DC

## 2021-04-14 MED ORDER — CLONAZEPAM 1 MG PO TABS: ORAL_TABLET | ORAL | 2 refills | Status: DC

## 2021-04-14 NOTE — Progress Notes (Signed)
Amy Mcintyre [id number] 10-13-1966 55 y.o.  Subjective:   Patient ID:  Amy Mcintyre is a 55 y.o. (DOB 24-Feb-1966) female.  Chief Complaint:  Chief Complaint  Patient presents with  . Anxiety  . Depression    HPI Amy Mcintyre presents to the office today for follow-up of anxiety, depression, and insomnia. Amy Mcintyre reports that Amy Mcintyre has not experienced any side effects with start of Wellbutrin XL on 03/30/21. Amy Mcintyre reports that Amy Mcintyre is having crying and "breakthrough tearfulness." Amy Mcintyre reports that Amy Mcintyre had tearfulness over the weekend. Amy Mcintyre reports that Amy Mcintyre will get tearful when feeling overwhelmed or behind at work. Amy Mcintyre reports that Amy Mcintyre has anxiety when Amy Mcintyre hears about other people being diagnosed with different health issues or dealing with health problems. Amy Mcintyre reports that this will prompt worry about the future, if Amy Mcintyre will have health issues, if Amy Mcintyre will be alone, where her daughter will end up, etc. Amy Mcintyre reports that Amy Mcintyre has anxiety about retirement since Amy Mcintyre has few hobbies outside of work. Amy Mcintyre reports that Amy Mcintyre is fearful about dying and is also fearful about dying alone. Amy Mcintyre reports worrying about things that may never happen. Amy Mcintyre reports catastrophic thinking. Amy Mcintyre reports that Amy Mcintyre does not have any goals or things to look forward to. Sleeping ok. Appetite is unchanged and reports that Amy Mcintyre has been gaining weight. Energy and motivation have been low. Amy Mcintyre reports difficulty with concentration and multi-tasking. Amy Mcintyre reports difficulty remembering things and will have to ask people Amy Mcintyre work with if they have discussed certain things. Amy Mcintyre reports that Amy Mcintyre has not remembered things from several years ago. Denies SI.   Amy Mcintyre has been taking more Klonopin prn to help with uncontrolled crying.   Amy Mcintyre will have 30 years of service in August. Amy Mcintyre plans to work an additional 3 years to get additional benefits. Amy Mcintyre reports that her relationship with [name] is going well.   Amy Mcintyre reports that today Amy Mcintyre had a 5.5 hour  meeting today at work and frequently has multiple meetings. Amy Mcintyre reports that Amy Mcintyre then has to work late to get caught up. Amy Mcintyre has tried to trim down her schedule.   Amy Mcintyre was able to enjoy a weekend away with [name] and her daughter.   Continues to see Rodena Goldmann for therapy.   Past Psychiatric Medication Trials: Paxil Prozac Sertraline Nortriptyline Doxepin BuSpar Abilify-increased heart rate Rexulti-Helpful for mood and anxiety Seroquel-adverse effects Gabapentin- excessive somnolence with TID prn Lunesta-not effective Ambien-parasomnias Trazodone-ineffective Belsomra-worsening insomnia Klonopin   AIMS   Flowsheet Row Office Visit from 01/29/2020 in [facility] Visit from 10/04/2019 in [facility]  AIMS Total Score 0 0       Review of Systems:  Review of Systems  Constitutional: Positive for fever.  Musculoskeletal: Negative for gait problem.       Chronic TMJ  Neurological: Negative for tremors.  Psychiatric/Behavioral:       Please refer to HPI    Medications: I have reviewed the patient's current medications.  Current Outpatient Medications  Medication Sig Dispense Refill  . Ascorbic Acid (VITAMIN C) 100 MG tablet Take 100 mg by mouth daily.    Amy Mcintyre Kitchen atorvastatin (LIPITOR) 40 MG tablet Take 40 mg by mouth daily.    . baclofen (LIORESAL) 20 MG tablet 20 mg 3 (three) times daily. TAKES BID    . ELDERBERRY PO Take by mouth.    . Lactobacillus (PROBIOTIC ACIDOPHILUS PO) Take by mouth.    . levothyroxine (SYNTHROID, LEVOTHROID) 75 MCG tablet Take  75 mcg by mouth daily.     . metFORMIN (GLUCOPHAGE) 500 MG tablet TAKE 1 TABLET BY MOUTH TWICE DAILY WITH MEALS (Patient taking differently: Taking 1-2 tabs daily) 180 tablet 0  . Multiple Vitamins-Minerals (ZINC PO) Take by mouth.    . pantoprazole (PROTONIX) 40 MG tablet Take 1 tablet by mouth once daily 90 tablet 0  . buPROPion (WELLBUTRIN XL) 150 MG 24 hr tablet Take 1 tablet (150 mg  total) by mouth daily. 30 tablet 2  . [START ON 04/25/2021] clonazePAM (KLONOPIN) 1 MG tablet TAKE 1/2 TO 1 (ONE-HALF TO ONE) TABLET BY MOUTH THREE TIMES DAILY AS NEEDED FOR PANIC ATTACK OR INSOMNIA 90 tablet 2  . gabapentin (NEURONTIN) 300 MG capsule Take 300 mg by mouth at bedtime.    . hydrocortisone (ANUSOL-HC) 25 MG suppository Place 1 suppository (25 mg total) rectally 2 (two) times daily. 28 suppository 0  . sertraline (ZOLOFT) 100 MG tablet Take 2 tablets (200 mg total) by mouth daily. 60 tablet 3   No current facility-administered medications for this visit.    Medication Side Effects: None  Allergies:  Allergies  Allergen Reactions  . Ambien [Zolpidem Tartrate]   . Erythromycin Swelling  . Penicillins Swelling    Past Medical History:  Diagnosis Date  . Anxiety   . Cataracts, bilateral   . Colitis   . Depression   . Eosinophilic esophagitis   . Fatty liver   . Gastritis   . GERD (gastroesophageal reflux disease)   . Hyperlipidemia   . Thyroid disease   . TMJ (dislocation of temporomandibular joint)     Past Medical History, Surgical history, Social history, and Family history were reviewed and updated as appropriate.   Please see review of systems for further details on the patient's review from today.   Objective:   Physical Exam:  Wt 146 lb (66.2 kg)   BMI 29.49 kg/m   Physical Exam Constitutional:      General: Amy Mcintyre is not in acute distress. Musculoskeletal:        General: No deformity.  Neurological:     Mental Status: Amy Mcintyre is alert and oriented to person, place, and time.     Coordination: Coordination normal.  Psychiatric:        Attention and Perception: Attention and perception normal. Amy Mcintyre does not perceive auditory or visual hallucinations.        Mood and Affect: Mood is anxious and depressed. Affect is not labile, blunt, angry or inappropriate.        Speech: Speech normal.        Behavior: Behavior normal.        Thought Content: Thought  content normal. Thought content is not paranoid or delusional. Thought content does not include homicidal or suicidal ideation. Thought content does not include homicidal or suicidal plan.        Cognition and Memory: Cognition and memory normal.        Judgment: Judgment normal.     Comments: Insight intact     Lab Review:     Component Value Date/Time   NA 140 12/13/2018 1158   K 4.0 12/13/2018 1158   CL 102 12/13/2018 1158   CO2 29 12/13/2018 1158   GLUCOSE 111 (H) 04/18/2019 0821   BUN 23 12/13/2018 1158   CREATININE 0.70 08/07/2019 0947   CALCIUM 10.0 12/13/2018 1158   PROT 7.6 12/13/2018 1158   ALBUMIN 4.7 12/13/2018 1158   AST 22 12/13/2018 1158   ALT  39 (H) 04/18/2019 0821   ALKPHOS 82 12/13/2018 1158   BILITOT 0.5 12/13/2018 1158       Component Value Date/Time   WBC 4.6 12/13/2018 1158   RBC 4.47 12/13/2018 1158   HGB 13.5 12/13/2018 1158   HCT 40.6 12/13/2018 1158   PLT 258.0 12/13/2018 1158   MCV 90.6 12/13/2018 1158   MCHC 33.3 12/13/2018 1158   RDW 13.7 12/13/2018 1158   LYMPHSABS 1.4 12/13/2018 1158   MONOABS 0.4 12/13/2018 1158   EOSABS 0.0 12/13/2018 1158   BASOSABS 0.0 12/13/2018 1158    No results found for: POCLITH, LITHIUM   No results found for: PHENYTOIN, PHENOBARB, VALPROATE, CBMZ   .res Assessment: Plan:   Will try increasing Gabapentin to 300 mg BID to TID to possibly improve anxiety.  Discussed more time is needed to determine response to Wellbutrin XL after starting it about 2 weeks ago. Discussed increasing Wellbutrin XL to 300 mg daily is a possible treatment option if Amy Mcintyre has a partial response to 150 mg dose.  Discussed potential benefits, risks, and side effects of Cytomel and discussed that this is an off label treatment for depression and may be considered in the future.  Continue Sertraline 200 mg po qd for depression and anxiety.  Continue Klonopin prn anxiety and insomnia.  Amy Mcintyre to follow-up in 3 months or sooner if clinically  indicated.  Patient advised to contact office with any questions, adverse effects, or acute worsening in signs and symptoms.   Amy Mcintyre was seen today for anxiety and depression.  Diagnoses and all orders for this visit:  Mild episode of recurrent major depressive disorder (HCC) -     buPROPion (WELLBUTRIN XL) 150 MG 24 hr tablet; Take 1 tablet (150 mg total) by mouth daily.  Generalized anxiety disorder -     clonazePAM (KLONOPIN) 1 MG tablet; TAKE 1/2 TO 1 (ONE-HALF TO ONE) TABLET BY MOUTH THREE TIMES DAILY AS NEEDED FOR PANIC ATTACK OR INSOMNIA -     sertraline (ZOLOFT) 100 MG tablet; Take 2 tablets (200 mg total) by mouth daily.  Primary insomnia -     clonazePAM (KLONOPIN) 1 MG tablet; TAKE 1/2 TO 1 (ONE-HALF TO ONE) TABLET BY MOUTH THREE TIMES DAILY AS NEEDED FOR PANIC ATTACK OR INSOMNIA  Moderate episode of recurrent major depressive disorder (HCC) -     sertraline (ZOLOFT) 100 MG tablet; Take 2 tablets (200 mg total) by mouth daily.     Please see After Visit Summary for patient specific instructions.  Future Appointments  Date Time Provider [place]  07/14/2021  4:00 PM Thayer Headings, PMHNP [facility] None    No orders of the defined types were placed in this encounter.   -------------------------------

## 2021-04-23 ENCOUNTER — Other Ambulatory Visit: Payer: Self-pay | Admitting: Psychiatry

## 2021-06-25 ENCOUNTER — Other Ambulatory Visit: Payer: Self-pay

## 2021-06-25 DIAGNOSIS — F330 Major depressive disorder, recurrent, mild: Secondary | ICD-10-CM

## 2021-06-25 MED ORDER — BUPROPION HCL ER (XL) 150 MG PO TB24: 150.0000 mg | ORAL_TABLET | Freq: Every day | ORAL | 1 refills | Status: DC

## 2021-07-02 ENCOUNTER — Telehealth: Payer: Self-pay | Admitting: Psychiatry

## 2021-07-02 NOTE — Telephone Encounter (Signed)
Please let her know that she can stop Wellbutrin XL without tapering since she is on the lowest dose and it targets dopamine instead of serotonin and this is not associated with discontinuation s/s like the SSRI's. Side effects will likely resolve within a few days of stopping Wellbutrin XL.

## 2021-07-02 NOTE — Telephone Encounter (Signed)
Pt stated she wants to stop because it is not helping her symptoms.She said it was added to help with her crying and having stress at work but that has not stopped.She stated she only has that problem at work and will have to learn how to deal with work related stress.She stated her family noticed she is more irritable and agitated.She does not want to add another med because she feels like she is already on enough.

## 2021-07-02 NOTE — Telephone Encounter (Signed)
Next visit is 07/14/21. Amy Mcintyre wants to stop taking the Bupropion. She would like a call back to find out how to taper down from it? Her phone number is [phone].

## 2021-07-02 NOTE — Telephone Encounter (Signed)
Pt informed

## 2021-07-14 ENCOUNTER — Other Ambulatory Visit: Payer: Self-pay

## 2021-07-14 ENCOUNTER — Ambulatory Visit (INDEPENDENT_AMBULATORY_CARE_PROVIDER_SITE_OTHER): Payer: PRIVATE HEALTH INSURANCE | Admitting: Psychiatry

## 2021-07-14 ENCOUNTER — Encounter: Payer: Self-pay | Admitting: Psychiatry

## 2021-07-14 DIAGNOSIS — F411 Generalized anxiety disorder: Secondary | ICD-10-CM | POA: Diagnosis not present

## 2021-07-14 DIAGNOSIS — F331 Major depressive disorder, recurrent, moderate: Secondary | ICD-10-CM | POA: Diagnosis not present

## 2021-07-14 DIAGNOSIS — F5101 Primary insomnia: Secondary | ICD-10-CM | POA: Diagnosis not present

## 2021-07-14 MED ORDER — SERTRALINE HCL 100 MG PO TABS: 200.0000 mg | ORAL_TABLET | Freq: Every day | ORAL | 5 refills | Status: DC

## 2021-07-14 MED ORDER — CLONAZEPAM 1 MG PO TABS: ORAL_TABLET | ORAL | 5 refills | Status: DC

## 2021-07-14 NOTE — Progress Notes (Signed)
Keyatta Waterhouse [id number] 08-02-1966 55 y.o.  Subjective:   Patient ID:  Amy Mcintyre is a 55 y.o. (DOB October 12, 1966) female.  Chief Complaint:  Chief Complaint  Patient presents with   Follow-up    Anxiety, depression, insomnia     HPI Amy Mcintyre presents to the office today for follow-up of anxiety, depression, and insomnia. She reports that she gets "teary and overwhelmed at work." She reports that she rarely gets sad or overwhelmed at work. She reports that she continues to work long hours and have extensive responsibilities. She has been working on setting boundaries at work. Has delegated what she can and has limited meetings as much as  possible. Has decided to not work after 7 pm. She reports that long hours have interfered with self care to include eating healthy and being physically active. She reports difficulty concentrating at work. Describes starting one task and then switching to another task. She reports that she experience occasional worry, rumination, and catastrophic thinking. Reports anxiety when deadlines are approaching. She reports that "it's my own pressure to get it done" and her boss has given her permission not to work extensive hours. She reports that depression "is not that bad." Occasional irritability when she feels that others are not working with her. She reports that she recently "lost my temper" with a coworker that does not complete tasks as soon as she would like. Sleeping well. She reports that she does not take Klonopin every night because some nights she feels that she can fall asleep without it. Taking Klonopin prn when feeling overwhelmed at work. Energy ok during work and feels mentally exhausted at the end of the day. Denies SI.   She is concerned about recent lab results that indicated elevated liver enzymes.   Took a vacation about a month ago. She was able to read for the first time in awhile.   Reports that she and Greg's relationship has been going  well. Going to visit daughter in [place] soon. Daughter does not want children. Close with mother and brother. "I don't take the time to develop hobbies and friends because I work all the time."   Continues to see Rodena Goldmann for therapy.   Past Psychiatric Medication Trials: Paxil Prozac Sertraline Nortriptyline Wellbutrin- not helpful for depression.  Doxepin BuSpar Abilify-increased heart rate Rexulti-Helpful for mood and anxiety Seroquel-adverse effects Gabapentin- excessive somnolence with TID prn Lunesta-not effective Ambien- parasomnias Trazodone-ineffective Belsomra-worsening insomnia Klonopin  AIMS    Flowsheet Row Office Visit from 01/29/2020 in [facility] Visit from 10/04/2019 in [facility]  AIMS Total Score 0 0        Review of Systems:  Review of Systems  Gastrointestinal: Negative.   Musculoskeletal:  Negative for gait problem.  Neurological:  Negative for tremors.  Psychiatric/Behavioral:         Please refer to HPI   Medications: I have reviewed the patient's current medications.  Current Outpatient Medications  Medication Sig Dispense Refill   Ascorbic Acid (VITAMIN C) 100 MG tablet Take 100 mg by mouth daily.     atorvastatin (LIPITOR) 40 MG tablet Take 40 mg by mouth daily.     baclofen (LIORESAL) 20 MG tablet 20 mg. qd     cholecalciferol (VITAMIN D3) 25 MCG (1000 UNIT) tablet Take 1,000 Units by mouth daily.     gabapentin (NEURONTIN) 300 MG capsule Take 300 mg by mouth 2 (two) times daily.     Lactobacillus (PROBIOTIC ACIDOPHILUS PO) Take  by mouth.     levothyroxine (SYNTHROID, LEVOTHROID) 75 MCG tablet Take 75 mcg by mouth daily.      nystatin cream (MYCOSTATIN) Apply 1 application topically 2 (two) times daily.     tacrolimus (PROTOPIC) 0.03 % ointment Apply topically 2 (two) times daily.     [START ON 08/05/2021] clonazePAM (KLONOPIN) 1 MG tablet TAKE 1/2 TO 1 (ONE-HALF TO ONE) TABLET BY MOUTH THREE  TIMES DAILY AS NEEDED FOR PANIC ATTACK OR INSOMNIA 90 tablet 5   hydrocortisone (ANUSOL-HC) 25 MG suppository Place 1 suppository (25 mg total) rectally 2 (two) times daily. 28 suppository 0   sertraline (ZOLOFT) 100 MG tablet Take 2 tablets (200 mg total) by mouth daily. 60 tablet 5   No current facility-administered medications for this visit.    Medication Side Effects: None  Allergies:  Allergies  Allergen Reactions   Ambien [Zolpidem Tartrate]    Erythromycin Swelling   Penicillins Swelling    Past Medical History:  Diagnosis Date   Anxiety    Cataracts, bilateral    Colitis    Depression    Eosinophilic esophagitis    Fatty liver    Gastritis    GERD (gastroesophageal reflux disease)    Hyperlipidemia    Thyroid disease    TMJ (dislocation of temporomandibular joint)     Past Medical History, Surgical history, Social history, and Family history were reviewed and updated as appropriate.   Please see review of systems for further details on the patient's review from today.   Objective:   Physical Exam:  Wt 139 lb (63 kg)   BMI 28.07 kg/m   Physical Exam Constitutional:      General: She is not in acute distress. Musculoskeletal:        General: No deformity.  Neurological:     Mental Status: She is alert and oriented to person, place, and time.     Coordination: Coordination normal.  Psychiatric:        Attention and Perception: Attention and perception normal. She does not perceive auditory or visual hallucinations.        Mood and Affect: Mood is anxious. Mood is not depressed. Affect is not labile, blunt, angry or inappropriate.        Speech: Speech normal.        Behavior: Behavior normal.        Thought Content: Thought content normal. Thought content is not paranoid or delusional. Thought content does not include homicidal or suicidal ideation. Thought content does not include homicidal or suicidal plan.        Cognition and Memory: Cognition and  memory normal.        Judgment: Judgment normal.     Comments: Insight intact    Lab Review:     Component Value Date/Time   NA 140 12/13/2018 1158   K 4.0 12/13/2018 1158   CL 102 12/13/2018 1158   CO2 29 12/13/2018 1158   GLUCOSE 111 (H) 04/18/2019 0821   BUN 23 12/13/2018 1158   CREATININE 0.70 08/07/2019 0947   CALCIUM 10.0 12/13/2018 1158   PROT 7.6 12/13/2018 1158   ALBUMIN 4.7 12/13/2018 1158   AST 22 12/13/2018 1158   ALT 39 (H) 04/18/2019 0821   ALKPHOS 82 12/13/2018 1158   BILITOT 0.5 12/13/2018 1158       Component Value Date/Time   WBC 4.6 12/13/2018 1158   RBC 4.47 12/13/2018 1158   HGB 13.5 12/13/2018 1158   HCT 40.6 12/13/2018  1158   PLT 258.0 12/13/2018 1158   MCV 90.6 12/13/2018 1158   MCHC 33.3 12/13/2018 1158   RDW 13.7 12/13/2018 1158   LYMPHSABS 1.4 12/13/2018 1158   MONOABS 0.4 12/13/2018 1158   EOSABS 0.0 12/13/2018 1158   BASOSABS 0.0 12/13/2018 1158    No results found for: POCLITH, LITHIUM   No results found for: PHENYTOIN, PHENOBARB, VALPROATE, CBMZ   .res Assessment: Plan:   Pt seen for 30 minutes and time spent counseling pt regarding her concerns about increase in liver enzymes.  Discussed that sertraline typically does not cause increase in liver enzymes.  Discussed potential benefits, risks, and side effects of decreasing sertraline.  Will continue sertraline 200 mg daily at this time due to continued anxiety in response to work stress. Continue Klonopin 1 mg 1/2 to 1 tablet 3 times a day as needed for anxiety. Patient to follow-up in 6 months or sooner if clinically indicated. Recommend continuing psychotherapy with Rodena Goldmann, [facility]. Patient advised to contact office with any questions, adverse effects, or acute worsening in signs and symptoms.  Amy Mcintyre was seen today for follow-up.  Diagnoses and all orders for this visit:  Generalized anxiety disorder -     clonazePAM (KLONOPIN) 1 MG tablet; TAKE 1/2 TO 1 (ONE-HALF TO ONE)  TABLET BY MOUTH THREE TIMES DAILY AS NEEDED FOR PANIC ATTACK OR INSOMNIA -     sertraline (ZOLOFT) 100 MG tablet; Take 2 tablets (200 mg total) by mouth daily.  Primary insomnia -     clonazePAM (KLONOPIN) 1 MG tablet; TAKE 1/2 TO 1 (ONE-HALF TO ONE) TABLET BY MOUTH THREE TIMES DAILY AS NEEDED FOR PANIC ATTACK OR INSOMNIA  Moderate episode of recurrent major depressive disorder (HCC) -     sertraline (ZOLOFT) 100 MG tablet; Take 2 tablets (200 mg total) by mouth daily.    Please see After Visit Summary for patient specific instructions.  Future Appointments  Date Time Provider [place]  01/14/2022  4:00 PM Thayer Headings, PMHNP CP-CP None    No orders of the defined types were placed in this encounter.   -------------------------------

## 2021-08-19 ENCOUNTER — Telehealth: Payer: Self-pay | Admitting: Gastroenterology

## 2021-08-19 NOTE — Telephone Encounter (Signed)
Pt requesting advise on what to do until her OV. Pt was encouraged to use Preparation H suppository, Soak in a warm tub of water if painful, use wipes for her hemorrhoids  and imodium for the diarrhea. Pt verbalized understanding

## 2021-08-19 NOTE — Telephone Encounter (Signed)
Inbound call from patient requesting a call from a nurse please.  She has been experiencing rectal bleeding, diarrhea, and hemorrhoids.  She scheduled an appt for 09/22/21 but wants to know what she can do in the meantime.

## 2021-09-22 ENCOUNTER — Ambulatory Visit (INDEPENDENT_AMBULATORY_CARE_PROVIDER_SITE_OTHER): Payer: PRIVATE HEALTH INSURANCE | Admitting: Gastroenterology

## 2021-09-22 ENCOUNTER — Encounter: Payer: Self-pay | Admitting: Gastroenterology

## 2021-09-22 ENCOUNTER — Other Ambulatory Visit: Payer: PRIVATE HEALTH INSURANCE

## 2021-09-22 ENCOUNTER — Telehealth: Payer: Self-pay | Admitting: Gastroenterology

## 2021-09-22 VITALS — BP 128/88 | HR 105 | Ht 59.75 in | Wt 146.1 lb

## 2021-09-22 DIAGNOSIS — K591 Functional diarrhea: Secondary | ICD-10-CM

## 2021-09-22 DIAGNOSIS — K760 Fatty (change of) liver, not elsewhere classified: Secondary | ICD-10-CM | POA: Diagnosis not present

## 2021-09-22 DIAGNOSIS — R748 Abnormal levels of other serum enzymes: Secondary | ICD-10-CM

## 2021-09-22 DIAGNOSIS — K625 Hemorrhage of anus and rectum: Secondary | ICD-10-CM

## 2021-09-22 MED ORDER — RIFAXIMIN 550 MG PO TABS: 550.0000 mg | ORAL_TABLET | Freq: Three times a day (TID) | ORAL | 0 refills | Status: AC

## 2021-09-22 NOTE — Patient Instructions (Addendum)
It was my pleasure to provide care to you today. Based on our discussion, I am providing you with my recommendations below:  RECOMMENDATION(S):   - High fiber diet recommended, drink at least 1.5-2 liters of water - Daily stool bulking agent with psyllium or methylcellulose recommended - Please purchase the following products OVER THE COUNTER: Anusol HC 2.5% cream  Preparation H suppository Apply Anusol HC 2.5% cream to the tip of the Preparation H suppository and insert rectally 2 times daily as needed - Referral to Dr. Silverio Decamp for hemorrhoidal banding - Surveillance colonoscopy 2024 as previously planned  PRESCRIPTION MEDICATION(S):   We have sent the following medication(s) to your pharmacy:  Xifaxan  NOTE: If your medication(s) requires a PRIOR AUTHORIZATION, we will receive notification from your pharmacy. Once received, the process to submit for approval may take up to 7-10 business days. You will be contacted about any denials we have received from your insurance company as well as alternatives recommended by your provider.  LABS:   Please proceed to the basement level for lab work before leaving today. Press "B" on the elevator. The lab is located at the first door on the left as you exit the elevator.  HEALTHCARE LAWS AND MY CHART RESULTS:   Due to recent changes in healthcare laws, you may see results of your imaging and/or laboratory studies on MyChart before I have had a chance to review them.  I understand that in some cases there may be results that are confusing or concerning to you. Please understand that not all results are received at same time and often I may need to interpret multiple results in order to provide you with the best plan of care or course of treatment. Therefore, I ask that you please give me 48 hours to thoroughly review all your results before contacting my office for clarification.   FOLLOW UP:  Please keep your appointment with Dr. Silverio Decamp as  scheduled. Refer to the appointments included within this After Visit Summary I would like for you to follow up with me in 6-8 weeks. Please schedule this appointment AFTER completing your hemorrhoid banding  BMI:  If you are age 56 or younger, your body mass index should be between 19-25. Your Body mass index is 28.78 kg/m. If this is out of the aformentioned range listed, please consider follow up with your Primary Care Provider.   MY CHART:  The [facility] providers would like to encourage you to use [facility] to communicate with providers for non-urgent requests or questions.  Due to long hold times on the telephone, sending your provider a message by [facility] may be a faster and more efficient way to get a response.  Please allow 48 business hours for a response.  Please remember that this is for non-urgent requests.   Thank you for trusting me with your gastrointestinal care!    Thornton Park, MD, MPH

## 2021-09-22 NOTE — Telephone Encounter (Signed)
Inbound call from patient states a prior authorization is needed for rifaximin.

## 2021-09-22 NOTE — Progress Notes (Signed)
Referring Provider: Tobe Sos, MD Primary Care Physician:  Tobe Sos, MD   Chief complaint:  Rectal bleeding   IMPRESSION:  Intermittent rectal bleeding attributed to internal hemorrhoids based on prior colonoscopy and rectal exam today Chronic diarrhea with intermittent bloating x 1 year, now improved    - likely due to hypothyroidism +/- functional diarrhea    - testing for celiac negative, giardia    - normal fecal calprotectin    - negative colon and duodenal biopsies    - not improved by probiotics    - breath test negative for SIBO by hydrogen or methane    - allergy testing 2020: cow's milk, string beans Abnormal imaging of the liver    - CT abd/pelvis with contrast 04/18/2019: 2.7 cm focal lesion was identified in segment 8 of the liver       -  left parametrial vein which may be seen with pelvic venous congestion syndrome.      - MRI  04/24/2019:  liver focal nodular hyperplasia. A bone island was also seen.    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    - testing for viral hepatitis and autoimmune hepatitis negative    - Fibrotest: fibrosis stage of 1, necroinflammatory grade of A0-A1 Recent elevated AST with normal ALT Reflux (biopsy proven) on EGD 10/19/18    - completed 8 weeks of PPI therapy Tubular adenoma 2019    - surveillance colonoscopy due 2024 Family history of colon polyps (father in his 26s)   PLAN: - [place] FibroSURE to restage fatty liver  - Trial of Xifaxan 550 mg TID x 14 days for SIBO +/-d-IBS, substitute with doxycycline 100 mg BID x 14 days if Xifaxan if cost-prohibitive - Repeat abdominal imaging if liver enzymes continue to increase - High fiber diet recommended, drink at least 1.5-2 liters of water - Daily stool bulking agent with  psyllium or methylcellulose recommended - Anusol HC 2.5% applied sparingly PR BID, may apply to a suppository - Will schedule hemorrhoidal banding - next available provider - Surveillance colonoscopy 2024 as previously planned - Office follow-up in 6-8 weeks   HPI: Amy Mcintyre is a 55 y.o. female who is seen for rectal bleeding.  She was last seen 10/18/2019 for abdominal pain, bloating, and chronic diarrhea attributed to functional diarrhea.  Evaluation included negative testing for celiac, Giardia, and normal fecal calprotectin, normal colon and duodenal biopsies, and a negative SIBO breath test.  There was temporal association in improved GI symptoms with improved regulation of thyroid.  During that evaluation she was found to have fatty liver and a 2.7 cm focal lesion in segment 8 of the liver thought to be a [place] on prior imaging and later hepatic adenoma on subsequent imaging.   Notes several month history of diarrhea with intermittent rectal bleeding that she attributes to hemorrhoids not responding to Preparation H. In talking to her, she's not sure that her symptoms present at the time of her last visit ever dramatically improved even with improved regulation of her thyroid.   Having a soft or watery bowel movement 4-5 times each day or varying volumes. Yellow, brown, and sometimes with mucous. No abdominal pain. No rectal pain. No straining.  Use antibiotics for  facial rash ?roscaea last year. No recent antibiotics. Denies a precipitating event, trauma, close contacts with similar symptoms, changes in diet, recent travel.  Remains at her stressful job.   Tearful and crying even though nothing is wrong despite antidepressant and antianxiety. Still dating her ex-husband and this relationship remains a positive in her life.    She brings a copy of recent labs from 06/25/21 showing an AST of 75 (previously normal) and ALT 30 (previously 31). Other liver tests were normal.  Hemoglobin down to  12.4 with ferritin of 63, MCV 97, RDW 12.6, platelets 223  Her last colonoscopy 10/19/2018 showed left-sided diverticulosis, a small tubular adenoma in the descending colon, and nonbleeding internal hemorrhoids.  Random colon biopsies were normal.   Past Medical History:  Diagnosis Date   Anxiety    Cataracts, bilateral    Colitis    Depression    Eosinophilic esophagitis    Fatty liver    Gastritis    GERD (gastroesophageal reflux disease)    Hyperlipidemia    Thyroid disease    TMJ (dislocation of temporomandibular joint)     Past Surgical History:  Procedure Laterality Date   BREAST ENHANCEMENT SURGERY     CESAREAN SECTION     NASAL SINUS SURGERY     tummy tuck     WRIST SURGERY Left      Current Outpatient Medications  Medication Sig Dispense Refill   Ascorbic Acid (VITAMIN C) 100 MG tablet Take 100 mg by mouth daily.     cholecalciferol (VITAMIN D3) 25 MCG (1000 UNIT) tablet Take 1,000 Units by mouth daily.     clonazePAM (KLONOPIN) 1 MG tablet TAKE 1/2 TO 1 (ONE-HALF TO ONE) TABLET BY MOUTH THREE TIMES DAILY AS NEEDED FOR PANIC ATTACK OR INSOMNIA 90 tablet 5   gabapentin (NEURONTIN) 300 MG capsule Take 300 mg by mouth 2 (two) times daily.     hydrocortisone (ANUSOL-HC) 25 MG suppository Place 1 suppository (25 mg total) rectally 2 (two) times daily. 28 suppository 0   levothyroxine (SYNTHROID, LEVOTHROID) 75 MCG tablet Take 75 mcg by mouth daily.      nystatin cream (MYCOSTATIN) Apply 1 application topically 2 (two) times daily.     sertraline (ZOLOFT) 100 MG tablet Take 2 tablets (200 mg total) by mouth daily. 60 tablet 5   tacrolimus (PROTOPIC) 0.03 % ointment Apply topically 2 (two) times daily.     No current facility-administered medications for this visit.    Allergies as of 09/22/2021 - Review Complete 09/22/2021  Allergen Reaction Noted   Ambien [zolpidem tartrate]  09/22/2018   Erythromycin Swelling 09/22/2018   Penicillins Swelling 09/22/2018     Family History  Problem Relation Age of Onset   Anxiety disorder Mother    Panic disorder Mother    OCD Mother    Anxiety disorder Father    Depression Father    Colon polyps Father    Depression Maternal Grandfather    Depression Paternal Grandmother    Drug abuse Brother    Colon cancer Neg Hx    Esophageal cancer Neg Hx    Pancreatic cancer Neg Hx    Stomach cancer Neg Hx    Liver disease Neg Hx    Rectal cancer Neg Hx     Physical Exam: General:   Alert,  well-nourished, pleasant and cooperative in NAD Head:  Normocephalic and atraumatic. Eyes:  Sclera clear, no icterus.   Conjunctiva pink. Abdomen:  Soft, nontender, nondistended, normal bowel sounds,  no rebound or guarding. No hepatosplenomegaly.   Rectal:   No chemical dermatitis. No external hemorrhoids, fissure or fistula. No prolapsing hemorrhoids. No rectal prolapse. Palpable internal hemorrhoids. Normal anocutaneous reflex. No stool or blood in the rectal vault. No mass or fecal impaction. Normal anal resting tone.  Chaperone: Sophia Msk:  Symmetrical. No boney deformities LAD: No inguinal or umbilical LAD Extremities:  No clubbing or edema. Neurologic:  Alert and  oriented x4;  grossly nonfocal Skin:  Intact without significant lesions or rashes. Psych:  Alert and cooperative. Normal mood and affect.     Gabryelle Whitmoyer L. Tarri Glenn, MD, MPH 09/22/2021, 8:43 AM

## 2021-09-23 ENCOUNTER — Other Ambulatory Visit: Payer: Self-pay

## 2021-09-23 ENCOUNTER — Telehealth: Payer: Self-pay | Admitting: Gastroenterology

## 2021-09-23 DIAGNOSIS — K591 Functional diarrhea: Secondary | ICD-10-CM

## 2021-09-23 MED ORDER — DOXYCYCLINE HYCLATE 100 MG PO TABS: 100.0000 mg | ORAL_TABLET | Freq: Two times a day (BID) | ORAL | 0 refills | Status: AC

## 2021-09-23 NOTE — Telephone Encounter (Signed)
Patient called requesting to speak with a nurse regarding uncontrollable BM's she had yesterday.

## 2021-09-23 NOTE — Telephone Encounter (Signed)
Patient called back and said she spoke with her insurance and they told her it would cost her over $2000.00 patient seeking an alternative.

## 2021-09-23 NOTE — Telephone Encounter (Signed)
APPROVAL  Medication: [place]: [facility] PA response: Nurse, adult. Notes: Letter received indicating pt will have out of pocket expense to cover cost of medication.  Given pt out of pocket expense to cover drug, Dr. Tarri Glenn addressed alternative during [place] 09/22/21 as below:  - Trial of Xifaxan 550 mg TID x 14 days, substitute with doxycycline 100 mg BID x 14 days if Xifaxan if cost-prohibitive  Rx for Doxycycline sent to pt pharmacy. Called pt and reminded her of above recommendation as discussed during [place] 09/22/21. Advised she call her pharmacy to f/u on status of refill. In addition, advised she ensure she take this medication with food to reduce risk for GI upset as well as to call our office in 48-72 hours if her loose stools do not slow down. Verbalized acceptance and understanding.

## 2021-09-23 NOTE — Telephone Encounter (Signed)
Addressed in previously created encounter 

## 2021-09-24 LAB — NASH FIBROSURE
ALPHA 2-MACROGLOBULINS, QN: 165 mg/dL (ref 110–276)
ALT (SGPT) P5P: 55 IU/L — ABNORMAL HIGH (ref 0–40)
AST (SGOT) P5P: 42 IU/L — ABNORMAL HIGH (ref 0–40)
Apolipoprotein A-1: 166 mg/dL (ref 116–209)
Bilirubin, Total: <0.1 mg/dL (ref 0.0–1.2)
Cholesterol, Total: 383 mg/dL — ABNORMAL HIGH (ref 100–199)
Fibrosis Score: 0.05 (ref 0.00–0.21)
GGT: 69 IU/L — ABNORMAL HIGH (ref 0–60)
Glucose: 103 mg/dL — ABNORMAL HIGH (ref 70–99)
Haptoglobin: 156 mg/dL (ref 33–346)
Height: 60 in
NASH Score: 0.75 — ABNORMAL HIGH
Steatosis Score: 0.86 — ABNORMAL HIGH (ref 0.00–0.30)
Triglycerides: 477 mg/dL — ABNORMAL HIGH (ref 0–149)
Weight: 146 [lb_av]

## 2021-09-25 ENCOUNTER — Telehealth: Payer: Self-pay | Admitting: Gastroenterology

## 2021-09-25 NOTE — Telephone Encounter (Signed)
Patient called requesting to speak with a nurse regarding lab results.

## 2021-09-25 NOTE — Telephone Encounter (Signed)
Returned pt call. Advised Dr. Tarri Glenn has not yet reviewed the results. Reassured she will receive a call once her results HAVE been reviewed by Dr. Tarri Glenn, along with her Dr. Tarri Glenn recommendations. This may take 5-10 business days. Verbalized acceptance and understanding.

## 2021-10-02 ENCOUNTER — Telehealth: Payer: Self-pay | Admitting: Psychiatry

## 2021-10-02 ENCOUNTER — Telehealth (INDEPENDENT_AMBULATORY_CARE_PROVIDER_SITE_OTHER): Payer: PRIVATE HEALTH INSURANCE | Admitting: Psychiatry

## 2021-10-02 ENCOUNTER — Encounter: Payer: Self-pay | Admitting: Psychiatry

## 2021-10-02 DIAGNOSIS — F411 Generalized anxiety disorder: Secondary | ICD-10-CM

## 2021-10-02 DIAGNOSIS — F332 Major depressive disorder, recurrent severe without psychotic features: Secondary | ICD-10-CM | POA: Diagnosis not present

## 2021-10-02 DIAGNOSIS — F5101 Primary insomnia: Secondary | ICD-10-CM | POA: Diagnosis not present

## 2021-10-02 MED ORDER — BREXPIPRAZOLE 1 MG PO TABS: 1.0000 mg | ORAL_TABLET | Freq: Every day | ORAL | 1 refills | Status: DC

## 2021-10-02 NOTE — Telephone Encounter (Signed)
Pt left a message that she is experiencing a major depressive disorder. She met with her counselor yesterday and the counselor wanted Amy Mcintyre to reach out to [place]. She would like to move up appointment and she also would like to talk to the nurse about adding a medication to . There is a specific one that she would like to discuss. Please call her at [phone]

## 2021-10-02 NOTE — Telephone Encounter (Signed)
I called patient to get more detailed information. She states she is having increased crying spells, crying several times a day. She said she has taken Rexulti in the past and wants to consider taking it again, but she is concerned about taking any medication that might cause weight gain as she is already "chunky". She states if you felt that could benefit her over another medication then she would take the [place]. She is overwhelmed by everything. If something comes up that is unplanned she can't deal with it. She states that she has had people at work that she has been able to talk to, but that if she didn't have this available to her she would consider suicide. She states she has no current plan to harm herself. She is having a lot of physical ailments that have her very stressed. She states she has internal hemorrhoids and is going through multiple pads a day. She had an episode of diarrhea at work. She was able to make it to the bathroom, but was not able to make it to the toilet and needed help. This was very embarrassing to her. She has recently seen a doctor and had multiple labs done. She states she is able to see the results in her online portal and can see that several labs are abnormal, but the nurse states she has to wait until the doctor reviews labs before she can tell her anything and this is stressful to her. Her boss has suggested she take a leave of absence and her therapist Rodena Goldmann) thought she should consider it.   She would like to get in sooner (either in person or phone visit) than her currently scheduled appointment. She is at a work conference next week until Thursday.

## 2021-10-02 NOTE — Progress Notes (Signed)
Amy Mcintyre [id number] [birth year] 55 y.o.  Virtual Visit via Video Note  I connected with pt @ on 10/02/21 at  3:00 PM EDT by a video enabled telemedicine application and verified that I am speaking with the correct person using two identifiers.   I discussed the limitations of evaluation and management by telemedicine and the availability of in person appointments. The patient expressed understanding and agreed to proceed.  I discussed the assessment and treatment plan with the patient. The patient was provided an opportunity to ask questions and all were answered. The patient agreed with the plan and demonstrated an understanding of the instructions.   The patient was advised to call back or seek an in-person evaluation if the symptoms worsen or if the condition fails to improve as anticipated.  I provided 45 minutes of non-face-to-face time during this encounter.  The patient was located at home.  The provider was located at [place].   Thayer Headings, PMHNP   Subjective:   Patient ID:  Amy Mcintyre is a 55 y.o. (DOB 1966-10-07) female.  Chief Complaint:  Chief Complaint  Patient presents with   Depression   Anxiety     Depression        Past medical history includes anxiety.   Anxiety    Amy Mcintyre presents for follow-up of anxiety, depression, and insomnia. She reports that she has been "progressively more depressed and crying" over the past few months. She reports that she has been crying frequently at home and at work. She reports that she recently cried daily while on weekly vacation. She reports that several coworkers have reached out to her because she seems more depressed and anxious. Her boss has alos noticed s/s of worsening depression and anxiety and discussed her taking a leave of absence. She reports that she is irritated with "other people not doing their part." Reports that she "lashed out at a peer" and that she no longer has lunch with this  friend and a group of coworkers that she used to eat lunch with.   She reports that she has had anxiety about her health, including recent lab results. She reports that her triglycerides and glucose levels have been increased. She reports that she has had severe diarrhea and has soiled her clothing at times. Has had an episode of bloody diarrhea at work and had to change her clothing. Now has anxiety about this happening again. She reports that she has internal hemorrhoids that will be banded later this month. She reports that she has anxiety and panic in certain stores that are crowded and/or disorganized. She reports that she has been taking Klonopin 1 mg TID for anxiety.   She reports difficulty making decisions. "I have no confidence left" despite receiving praise from coworkers. Sleep is poor. She reports appetite. She reports low energy and motivation- "it's all I can do to shower and get to work." She reports that on the weekends she does not shower or go anywhere.She reports occasional vague suicidal thoughts. Denies suicidal intent or plan.   She reports, "I'm tired of hurting." She reports that she feels "very alone." She reports that she has few friends and mother is the only source of support. Reports that significant other declined her request to see him Wednesday evening. She reports they typically talk every evening and most mornings but has not talked with him Wednesday morning. "I feel like the other shoe is about to drop there too."   Saw therapist yesterday and  reports crying "uncontrollably."  Past Psychiatric Medication Trials: Paxil Prozac Sertraline Nortriptyline Wellbutrin- not helpful for depression.  Doxepin BuSpar Abilify-increased heart rate Rexulti-Helpful for mood and anxiety Seroquel-adverse effects Gabapentin- excessive somnolence with TID prn Lunesta-not effective Ambien- parasomnias Trazodone-ineffective Belsomra-worsening insomnia Klonopin   Review of  Systems:  Review of Systems  Gastrointestinal:  Positive for blood in stool and diarrhea.  Musculoskeletal:  Negative for gait problem.       TMJ  Psychiatric/Behavioral:  Positive for depression.        Please refer to HPI   Medications: I have reviewed the patient's current medications.  Current Outpatient Medications  Medication Sig Dispense Refill   cholecalciferol (VITAMIN D3) 25 MCG (1000 UNIT) tablet Take 1,000 Units by mouth daily.     clonazePAM (KLONOPIN) 1 MG tablet TAKE 1/2 TO 1 (ONE-HALF TO ONE) TABLET BY MOUTH THREE TIMES DAILY AS NEEDED FOR PANIC ATTACK OR INSOMNIA 90 tablet 5   doxycycline (VIBRA-TABS) 100 MG tablet Take 1 tablet (100 mg total) by mouth 2 (two) times daily for 14 days. 28 tablet 0   gabapentin (NEURONTIN) 300 MG capsule Take 300 mg by mouth 3 (three) times daily.     levothyroxine (SYNTHROID, LEVOTHROID) 75 MCG tablet Take 75 mcg by mouth daily.      sertraline (ZOLOFT) 100 MG tablet Take 2 tablets (200 mg total) by mouth daily. 60 tablet 5   tacrolimus (PROTOPIC) 0.03 % ointment Apply topically 2 (two) times daily.     Ascorbic Acid (VITAMIN C) 100 MG tablet Take 100 mg by mouth daily.     brexpiprazole (REXULTI) 1 MG TABS tablet Take 1 tablet (1 mg total) by mouth daily. 30 tablet 1   hydrocortisone (ANUSOL-HC) 25 MG suppository Place 1 suppository (25 mg total) rectally 2 (two) times daily. 28 suppository 0   nystatin cream (MYCOSTATIN) Apply 1 application topically 2 (two) times daily.     rifaximin (XIFAXAN) 550 MG TABS tablet Take 1 tablet (550 mg total) by mouth 3 (three) times daily for 14 days. (Patient not taking: Reported on 10/02/2021) 42 tablet 0   No current facility-administered medications for this visit.    Medication Side Effects: None  Allergies:  Allergies  Allergen Reactions   Ambien [Zolpidem Tartrate]    Erythromycin Swelling   Penicillins Swelling    Past Medical History:  Diagnosis Date   Anxiety    Cataracts,  bilateral    Colitis    Depression    Eosinophilic esophagitis    Fatty liver    Gastritis    GERD (gastroesophageal reflux disease)    Hyperlipidemia    Thyroid disease    TMJ (dislocation of temporomandibular joint)     Family History  Problem Relation Age of Onset   Anxiety disorder Mother    Panic disorder Mother    OCD Mother    Anxiety disorder Father    Depression Father    Colon polyps Father    Depression Maternal Grandfather    Depression Paternal Grandmother    Drug abuse Brother    Colon cancer Neg Hx    Esophageal cancer Neg Hx    Pancreatic cancer Neg Hx    Stomach cancer Neg Hx    Liver disease Neg Hx    Rectal cancer Neg Hx     Social History   Socioeconomic History   Marital status: Single    Spouse name: Not on file   Number of children: 1   Years of  education: Not on file   Highest education level: Not on file  Occupational History   Occupation: MANAGER  Tobacco Use   Smoking status: Never   Smokeless tobacco: Never  Vaping Use   Vaping Use: Never used  Substance and Sexual Activity   Alcohol use: Yes    Comment: socially   Drug use: Never   Sexual activity: Not on file  Other Topics Concern   Not on file  Social History Narrative   Not on file   Social Determinants of Health   Financial Resource Strain: Not on file  Food Insecurity: Not on file  Transportation Needs: Not on file  Physical Activity: Not on file  Stress: Not on file  Social Connections: Not on file  Intimate Partner Violence: Not on file    Past Medical History, Surgical history, Social history, and Family history were reviewed and updated as appropriate.   Please see review of systems for further details on the patient's review from today.   Objective:   Physical Exam:  There were no vitals taken for this visit.  Physical Exam Neurological:     Mental Status: She is alert and oriented to person, place, and time.     Cranial Nerves: No dysarthria.   Psychiatric:        Attention and Perception: Attention and perception normal.        Mood and Affect: Mood is anxious and depressed. Affect is tearful.        Speech: Speech normal.        Behavior: Behavior is cooperative.        Thought Content: Thought content normal. Thought content is not paranoid or delusional. Thought content does not include homicidal or suicidal ideation. Thought content does not include homicidal or suicidal plan.        Cognition and Memory: Cognition and memory normal.        Judgment: Judgment normal.     Comments: Insight intact    Lab Review:     Component Value Date/Time   NA 140 12/13/2018 1158   K 4.0 12/13/2018 1158   CL 102 12/13/2018 1158   CO2 29 12/13/2018 1158   GLUCOSE 111 (H) 04/18/2019 0821   BUN 23 12/13/2018 1158   CREATININE 0.70 08/07/2019 0947   CALCIUM 10.0 12/13/2018 1158   PROT 7.6 12/13/2018 1158   ALBUMIN 4.7 12/13/2018 1158   AST 22 12/13/2018 1158   ALT 39 (H) 04/18/2019 0821   ALKPHOS 82 12/13/2018 1158   BILITOT 0.5 12/13/2018 1158       Component Value Date/Time   WBC 4.6 12/13/2018 1158   RBC 4.47 12/13/2018 1158   HGB 13.5 12/13/2018 1158   HCT 40.6 12/13/2018 1158   PLT 258.0 12/13/2018 1158   MCV 90.6 12/13/2018 1158   MCHC 33.3 12/13/2018 1158   RDW 13.7 12/13/2018 1158   LYMPHSABS 1.4 12/13/2018 1158   MONOABS 0.4 12/13/2018 1158   EOSABS 0.0 12/13/2018 1158   BASOSABS 0.0 12/13/2018 1158    No results found for: POCLITH, LITHIUM   No results found for: PHENYTOIN, PHENOBARB, VALPROATE, CBMZ   .res Assessment: Plan:    Pt seen for 45 minutes and time spent discussing treatment plan with pt. Discussed that both this provider and her therapist agreed that Murray seemed to be the most effective medication for her mood and anxiety. Discussed that Rexulti typically is effective in less than 2 weeks and may help bring about immediate improvement in  mood and anxiety s/s since Rexulti was helpful for  these s/s in the past. Pt agrees to re-trial of Rexulti. Will start Rexulti 1 mg 1/2 tab daily for one week, then increase to 1 tab po qd for augmentation of depression.  Continue Sertraline 200 mg po qd for anxiety and depression.  Continue Klonopin 1 mg po TID for anxiety. Continue Gabapentin 300 mg po TID for anxiety and TMJ. Agree with plan to increase frequency of therapy visits to weekly.  Discussed option of PHP and/or IOP and provided pt with contact information for programs.  Discussed option of leave of absence from work or modified work schedule and that provider would be in support of this.  Reviewed labs and read GI specialist's comments to pt that she had not seen. Pt reports that this was reassuring that labs indicated continued fatty liver disease without indication of scarring or damage and repeat labs were not needed until 12-24 months from now.  She reports that she is planning to be away in [place] for work next week and anticipates that this may be less stressful and a good opportunity to start [place].   Pt to follow-up in 4 weeks or sooner if clinically indicated.  Patient advised to contact office with any questions, adverse effects, or acute worsening in signs and symptoms.   Candice was seen today for depression and anxiety.  Diagnoses and all orders for this visit:  Severe episode of recurrent major depressive disorder, without psychotic features ([place]) -     Discontinue: brexpiprazole (REXULTI) 1 MG TABS tablet; Take 1 tablet (1 mg total) by mouth daily. -     brexpiprazole (REXULTI) 1 MG TABS tablet; Take 1 tablet (1 mg total) by mouth daily.  Generalized anxiety disorder  Primary insomnia    Please see After Visit Summary for patient specific instructions.  Future Appointments  Date Time Provider [place]  10/16/2021  3:50 PM Mauri Pole, MD [facility]  11/06/2021 11:30 AM Thayer Headings, PMHNP [facility] None  01/14/2022  4:00 PM Thayer Headings, PMHNP [facility] None    No orders of the defined types were placed in this encounter.     -------------------------------

## 2021-10-02 NOTE — Telephone Encounter (Signed)
Scheduled for 3pm today

## 2021-10-03 ENCOUNTER — Telehealth: Payer: Self-pay | Admitting: Psychiatry

## 2021-10-03 DIAGNOSIS — F332 Major depressive disorder, recurrent severe without psychotic features: Secondary | ICD-10-CM

## 2021-10-03 MED ORDER — BREXPIPRAZOLE 1 MG PO TABS: 1.0000 mg | ORAL_TABLET | Freq: Every day | ORAL | 1 refills | Status: DC

## 2021-10-03 NOTE — Telephone Encounter (Signed)
Received call from pt that [facility] is requiring a PA. Attempted to send free trial voucher for Rexulti. Contacted pharmacy and was informed voucher is invalid. Initiated PA for [place]. PA pending at this time. Informed pt of the above and discussed that she could also come to the office to pick-up samples. Pt reports that she will come to the office to pick up samples around 9-9:30 am Monday morning.

## 2021-10-07 ENCOUNTER — Telehealth: Payer: Self-pay

## 2021-10-07 ENCOUNTER — Telehealth: Payer: Self-pay | Admitting: Gastroenterology

## 2021-10-07 NOTE — Telephone Encounter (Signed)
Returned pt call to inquire further. States SHE chose to discontinue Protonix herself because SHE was trying to eliminate and/or reduce the # of Rx medications she is currently taking. PCP's involvement was merely input to determine if Protonix was an appropriate medication she COULD attempt to eliminate. Further adds no medical contraindication had occurred nor was provided that resulted in the PCP advising her to stop the medication. States her GERD has returned and now she would like to know if she should take the medication. Pt was asked if she felt her symptoms had resolved while taking Protonix, to which she confirmed. Recommendation was provided for pt to resume Protonix as it seems to have in fact resolved her symptoms. Verbalized acceptance and understanding.  While on the phone, pt asked if she could have her anal skin tags removed. Advised this is not a procedure offered by our providers. If they felt pt needed further anorectal tx't beyond their expertise, she would be referred to a colorectal surgeon for further advice. Pt also inquired if she has been released by Dr. Tarri Glenn to Dr. Silverio Decamp. Advised, hemorrhoid banding is not a service provided by Dr. Tarri Glenn and for that reason, this is why she is being seen by Dr. Silverio Decamp. Reassured she will return back to Dr. Tarri Glenn care AFTER treatment of her hemorrhoids via banding. Verbalized acceptance and understanding.

## 2021-10-07 NOTE — Telephone Encounter (Signed)
Prior Authorization submitted and approved for REXULTI 1 MG effective 10/05/2021-10/04/2022 with [facility] [employer] [phone] p [phone] f

## 2021-10-07 NOTE — Telephone Encounter (Signed)
Inbound call from patient request a call back to discuss stop taking Protonix. States her PCP advised her to stop but she still experience GERD not as often but enough to notice.   Patient also have question about her hemorrhoid banding if the skintag will be left

## 2021-10-16 ENCOUNTER — Ambulatory Visit (INDEPENDENT_AMBULATORY_CARE_PROVIDER_SITE_OTHER): Payer: PRIVATE HEALTH INSURANCE | Admitting: Gastroenterology

## 2021-10-16 ENCOUNTER — Encounter: Payer: Self-pay | Admitting: Gastroenterology

## 2021-10-16 VITALS — BP 110/62 | HR 104 | Ht 59.75 in | Wt 147.2 lb

## 2021-10-16 DIAGNOSIS — K641 Second degree hemorrhoids: Secondary | ICD-10-CM | POA: Diagnosis not present

## 2021-10-16 NOTE — Progress Notes (Signed)
  PROCEDURE NOTE: The patient presents with symptomatic grade II internal hemorrhoids, external hemorrhoids and skin tags, requesting rubber band ligation of his/her hemorrhoidal disease.  All risks, benefits and alternative forms of therapy were described and informed consent was obtained.  In the Left Lateral Decubitus position anoscopic examination revealed grade II hemorrhoids in the left lateral, right posterior and right anterior position(s) She also has small external hemorrhoids and skin tags.  The anorectum was pre-medicated with 0.125% NTG and Recticare The decision was made to band the Left lateral internal hemorrhoid, and the [place] was used to perform band ligation without complication.  Digital anorectal examination was then performed to assure proper positioning of the band, and to adjust the banded tissue as required.  The patient was discharged home without pain or other issues.  Dietary and behavioral recommendations were given and along with follow-up instructions.     The following adjunctive treatments were recommended:  Benefiber 1 tablespoon 3 times daily with meals Docusate stool softener 1 capsule daily at bedtime as needed Increase water intake to 8 cups daily  The patient will return in 2-4 weeks for  follow-up and possible additional banding as required. No complications were encountered and the patient tolerated the procedure well.   Damaris Hippo , MD [phone]

## 2021-10-16 NOTE — Patient Instructions (Addendum)
HEMORRHOID BANDING PROCEDURE    FOLLOW-UP CARE   The procedure you have had should have been relatively painless since the banding of the area involved does not have nerve endings and there is no pain sensation.  The rubber band cuts off the blood supply to the hemorrhoid and the band may fall off as soon as 48 hours after the banding (the band may occasionally be seen in the toilet bowl following a bowel movement). You may notice a temporary feeling of fullness in the rectum which should respond adequately to plain Tylenol or Motrin.  Following the banding, avoid strenuous exercise that evening and resume full activity the next day.  A sitz bath (soaking in a warm tub) or bidet is soothing, and can be useful for cleansing the area after bowel movements.     To avoid constipation, take two tablespoons of natural wheat bran, natural oat bran, flax, Benefiber or any over the counter fiber supplement and increase your water intake to 7-8 glasses daily.    Unless you have been prescribed anorectal medication, do not put anything inside your rectum for two weeks: No suppositories, enemas, fingers, etc.  Occasionally, you may have more bleeding than usual after the banding procedure.  This is often from the untreated hemorrhoids rather than the treated one.  Don't be concerned if there is a tablespoon or so of blood.  If there is more blood than this, lie flat with your bottom higher than your head and apply an ice pack to the area. If the bleeding does not stop within a half an hour or if you feel faint, call our office at [phone] or go to the emergency room.  Problems are not common; however, if there is a substantial amount of bleeding, severe pain, chills, fever or difficulty passing urine (very rare) or other problems, you should call us at [phone] or report to the nearest emergency room.  Do not stay seated continuously for more than 2-3 hours for a day or two after the procedure.   Tighten your buttock muscles 10-15 times every two hours and take 10-15 deep breaths every 1-2 hours.  Do not spend more than a few minutes on the toilet if you cannot empty your bowel; instead re-visit the toilet at a later time.      Benefiber 1 tablespoon three times daily with meals  8-10 cups of water daily  Colace at bedtime as needed   I appreciate the  opportunity to care for you  Thank You   Harl Bowie , MD

## 2021-11-06 ENCOUNTER — Telehealth (INDEPENDENT_AMBULATORY_CARE_PROVIDER_SITE_OTHER): Payer: PRIVATE HEALTH INSURANCE | Admitting: Psychiatry

## 2021-11-06 ENCOUNTER — Encounter: Payer: Self-pay | Admitting: Psychiatry

## 2021-11-06 DIAGNOSIS — F332 Major depressive disorder, recurrent severe without psychotic features: Secondary | ICD-10-CM

## 2021-11-06 DIAGNOSIS — F331 Major depressive disorder, recurrent, moderate: Secondary | ICD-10-CM | POA: Diagnosis not present

## 2021-11-06 DIAGNOSIS — F411 Generalized anxiety disorder: Secondary | ICD-10-CM

## 2021-11-06 MED ORDER — BREXPIPRAZOLE 1 MG PO TABS: 1.0000 mg | ORAL_TABLET | Freq: Every day | ORAL | 1 refills | Status: DC

## 2021-11-06 MED ORDER — METFORMIN HCL 500 MG PO TABS: ORAL_TABLET | ORAL | 0 refills | Status: DC

## 2021-11-06 MED ORDER — SERTRALINE HCL 100 MG PO TABS: 200.0000 mg | ORAL_TABLET | Freq: Every day | ORAL | 0 refills | Status: DC

## 2021-11-06 NOTE — Progress Notes (Signed)
Amy Mcintyre [id number] 10-05-1966 55 y.o.  Virtual Visit via Video Note  I connected with pt @ on 11/06/21 at 11:30 AM EST by a video enabled telemedicine application and verified that I am speaking with the correct person using two identifiers.   I discussed the limitations of evaluation and management by telemedicine and the availability of in person appointments. The patient expressed understanding and agreed to proceed.  I discussed the assessment and treatment plan with the patient. The patient was provided an opportunity to ask questions and all were answered. The patient agreed with the plan and demonstrated an understanding of the instructions.   The patient was advised to call back or seek an in-person evaluation if the symptoms worsen or if the condition fails to improve as anticipated.  I provided 30 minutes of non-face-to-face time during this encounter.  The patient was located at home.  The provider was located at [place].   Thayer Headings, PMHNP   Subjective:   Patient ID:  Amy Mcintyre is a 55 y.o. (DOB 26-Aug-1966) female.  Chief Complaint:  Chief Complaint  Patient presents with   Follow-up    Depression, anxiety     HPI Amy Mcintyre presents for follow-up of depression, anxiety, and insomnia. "I am doing much, much better." She reports occ tearfulness "but it doesn't last too long." She reports improved depression and anxiety. Denies panic attacks. She reports that coworkers and others have noticed she seems "like her old self." She reports that she continues to have "some" mild depression. She reports that she is no longer irritated by people not completing their responsibilities. Improved energy and motivation. Concentration has been adequate. She reports improved confidence. Able to make decisions more easily. Denies anhedonia. She reports that she has been falling asleep easier and not needing to take medication to help with sleep. Denies SI.  She  reports that she has gained weight and could not wear a pair of pants from last month. She reports that her appetite has improved.   She reports that her boss has talked to her about her leaving on several occurrences. Reports that boss was "against" her using FMLA for depression and anxiety. She stayed with her boyfriend 2 weeks when she started Rexulti until her mood improved. She reports that she has not missed any work. "If I have been anxious, it's been that."   Seeing therapist regularly.   Taking Gabapentin 2-3 times daily.   Past Psychiatric Medication Trials: Paxil Prozac Sertraline Nortriptyline Wellbutrin- not helpful for depression.  Doxepin BuSpar Abilify-increased heart rate Rexulti-Helpful for mood and anxiety Seroquel-adverse effects Gabapentin- excessive somnolence with TID prn Lunesta-not effective Ambien- parasomnias Trazodone-ineffective Belsomra-worsening insomnia Klonopin  Review of Systems:  Review of Systems  Gastrointestinal:  Positive for abdominal distention.       Had one hemorrhoid banded and needs 2 other hemorrhoids banded. She reports that she is no longer having bloody diarrhea.   Musculoskeletal:  Negative for gait problem.  Neurological:  Negative for tremors.  Psychiatric/Behavioral:         Please refer to HPI   Medications: I have reviewed the patient's current medications.  Current Outpatient Medications  Medication Sig Dispense Refill   Ascorbic Acid (VITAMIN C) 100 MG tablet Take 100 mg by mouth daily.     cholecalciferol (VITAMIN D3) 25 MCG (1000 UNIT) tablet Take 1,000 Units by mouth daily.     clonazePAM (KLONOPIN) 1 MG tablet TAKE 1/2 TO 1 (ONE-HALF TO ONE) TABLET BY  MOUTH THREE TIMES DAILY AS NEEDED FOR PANIC ATTACK OR INSOMNIA 90 tablet 5   gabapentin (NEURONTIN) 300 MG capsule Take 300 mg by mouth 3 (three) times daily.     levothyroxine (SYNTHROID, LEVOTHROID) 75 MCG tablet Take 75 mcg by mouth daily.      metFORMIN  (GLUCOPHAGE) 500 MG tablet Take 1 tablet daily with food for one week, then increase to 1 tablet BID with food 180 tablet 0   nystatin cream (MYCOSTATIN) Apply 1 application topically as needed.     tacrolimus (PROTOPIC) 0.03 % ointment Apply topically 2 (two) times daily.     brexpiprazole (REXULTI) 1 MG TABS tablet Take 1 tablet (1 mg total) by mouth daily. 30 tablet 1   hydrocortisone (ANUSOL-HC) 25 MG suppository Place 1 suppository (25 mg total) rectally 2 (two) times daily. 28 suppository 0   sertraline (ZOLOFT) 100 MG tablet Take 2 tablets (200 mg total) by mouth daily. 180 tablet 0   No current facility-administered medications for this visit.    Medication Side Effects: Other: wt gain   Denies involuntary movement  Allergies:  Allergies  Allergen Reactions   Ambien [Zolpidem Tartrate]    Erythromycin Swelling   Penicillins Swelling    Past Medical History:  Diagnosis Date   Anxiety    Cataracts, bilateral    Colitis    Depression    Eosinophilic esophagitis    Fatty liver    Gastritis    GERD (gastroesophageal reflux disease)    Hyperlipidemia    Thyroid disease    TMJ (dislocation of temporomandibular joint)     Family History  Problem Relation Age of Onset   Anxiety disorder Mother    Panic disorder Mother    OCD Mother    Depression Mother    Anxiety disorder Father    Depression Father    Colon polyps Father    Drug abuse Brother    Depression Maternal Grandfather    Depression Paternal Grandmother    Colon cancer Neg Hx    Esophageal cancer Neg Hx    Pancreatic cancer Neg Hx    Stomach cancer Neg Hx    Liver disease Neg Hx    Rectal cancer Neg Hx     Social History   Socioeconomic History   Marital status: Single    Spouse name: Not on file   Number of children: 1   Years of education: Not on file   Highest education level: Not on file  Occupational History   Occupation: MANAGER  Tobacco Use   Smoking status: Never   Smokeless tobacco:  Never  Vaping Use   Vaping Use: Never used  Substance and Sexual Activity   Alcohol use: Yes    Comment: socially   Drug use: Never   Sexual activity: Not on file  Other Topics Concern   Not on file  Social History Narrative   Not on file   Social Determinants of Health   Financial Resource Strain: Not on file  Food Insecurity: Not on file  Transportation Needs: Not on file  Physical Activity: Not on file  Stress: Not on file  Social Connections: Not on file  Intimate Partner Violence: Not on file    Past Medical History, Surgical history, Social history, and Family history were reviewed and updated as appropriate.   Please see review of systems for further details on the patient's review from today.   Objective:   Physical Exam:  There were no vitals taken  for this visit.  Physical Exam Neurological:     Mental Status: She is alert and oriented to person, place, and time.     Cranial Nerves: No dysarthria.  Psychiatric:        Attention and Perception: Attention and perception normal.        Speech: Speech normal.        Behavior: Behavior is cooperative.        Thought Content: Thought content normal. Thought content is not paranoid or delusional. Thought content does not include homicidal or suicidal ideation. Thought content does not include homicidal or suicidal plan.        Cognition and Memory: Cognition and memory normal.        Judgment: Judgment normal.     Comments: Insight intact Mood is mildly depressed. Minimal anxiety.     Lab Review:     Component Value Date/Time   NA 140 12/13/2018 1158   K 4.0 12/13/2018 1158   CL 102 12/13/2018 1158   CO2 29 12/13/2018 1158   GLUCOSE 111 (H) 04/18/2019 0821   BUN 23 12/13/2018 1158   CREATININE 0.70 08/07/2019 0947   CALCIUM 10.0 12/13/2018 1158   PROT 7.6 12/13/2018 1158   ALBUMIN 4.7 12/13/2018 1158   AST 22 12/13/2018 1158   ALT 39 (H) 04/18/2019 0821   ALKPHOS 82 12/13/2018 1158   BILITOT 0.5  12/13/2018 1158       Component Value Date/Time   WBC 4.6 12/13/2018 1158   RBC 4.47 12/13/2018 1158   HGB 13.5 12/13/2018 1158   HCT 40.6 12/13/2018 1158   PLT 258.0 12/13/2018 1158   MCV 90.6 12/13/2018 1158   MCHC 33.3 12/13/2018 1158   RDW 13.7 12/13/2018 1158   LYMPHSABS 1.4 12/13/2018 1158   MONOABS 0.4 12/13/2018 1158   EOSABS 0.0 12/13/2018 1158   BASOSABS 0.0 12/13/2018 1158    No results found for: POCLITH, LITHIUM   No results found for: PHENYTOIN, PHENOBARB, VALPROATE, CBMZ   .res Assessment: Plan:   Pt seen for 30 minutes and time spent reviewing response to [place]. Discussed option to either continue Rexulti 1 mg daily or increasing to 2 mg daily. She reports that she would like to continue Rexulti 1 mg daily since she is continuing to experience improvement in mood and anxiety s/s.  Discussed re-starting Metformin to minimize antipsychotic induced weight gain. She agrees to re-start of Metformin. Will re-start 500 mg po qd with a meal for one week, then increase to 500 mg BID.  Continue Sertraline 200 mg po qd for mood and anxiety.  Continue Klonopin prn anxiety and insomnia.  Recommend continuing therapy.  Pt to follow-up in 2 months or sooner if clinically indicated.  Patient advised to contact office with any questions, adverse effects, or acute worsening in signs and symptoms.   Amy Mcintyre was seen today for follow-up.  Diagnoses and all orders for this visit:  Severe episode of recurrent major depressive disorder, without psychotic features ([place]) -     brexpiprazole (REXULTI) 1 MG TABS tablet; Take 1 tablet (1 mg total) by mouth daily.  Generalized anxiety disorder -     sertraline (ZOLOFT) 100 MG tablet; Take 2 tablets (200 mg total) by mouth daily.  Moderate episode of recurrent major depressive disorder ([facility]) -     sertraline (ZOLOFT) 100 MG tablet; Take 2 tablets (200 mg total) by mouth daily.  Other orders -     metFORMIN (GLUCOPHAGE) 500 MG  tablet; Take  1 tablet daily with food for one week, then increase to 1 tablet BID with food    Please see After Visit Summary for patient specific instructions.  Future Appointments  Date Time Provider Junction  11/09/2021  3:50 PM Mauri Pole, MD [facility]  12/18/2021  3:50 PM Mauri Pole, MD [facility]  01/14/2022  4:00 PM Thayer Headings, PMHNP [facility] None    No orders of the defined types were placed in this encounter.     -------------------------------

## 2021-11-09 ENCOUNTER — Ambulatory Visit (INDEPENDENT_AMBULATORY_CARE_PROVIDER_SITE_OTHER): Payer: PRIVATE HEALTH INSURANCE | Admitting: Gastroenterology

## 2021-11-09 ENCOUNTER — Encounter: Payer: Self-pay | Admitting: Gastroenterology

## 2021-11-09 VITALS — BP 110/72 | HR 100 | Ht 59.75 in | Wt 152.6 lb

## 2021-11-09 DIAGNOSIS — K641 Second degree hemorrhoids: Secondary | ICD-10-CM | POA: Diagnosis not present

## 2021-11-09 NOTE — Progress Notes (Signed)
PROCEDURE NOTE: The patient presents with symptomatic grade II-III  hemorrhoids, requesting rubber band ligation of his/her hemorrhoidal disease.  All risks, benefits and alternative forms of therapy were described and informed consent was obtained.   The anorectum was pre-medicated with 0.125% NTG and Recticare The decision was made to band the right posterior internal hemorrhoid, and the [place] was used to perform band ligation without complication.  Digital anorectal examination was then performed to assure proper positioning of the band, and to adjust the banded tissue as required.  The patient was discharged home without pain or other issues.  Dietary and behavioral recommendations were given and along with follow-up instructions.     The following adjunctive treatments were recommended:  Benefiber 1-2 tablespoon 2-3 times daily  The patient will return in 2 weeks for  follow-up and possible additional banding as required. No complications were encountered and the patient tolerated the procedure well.

## 2021-11-09 NOTE — Patient Instructions (Signed)
HEMORRHOID BANDING PROCEDURE  ? ? FOLLOW-UP CARE ? ? ?The procedure you have had should have been relatively painless since the banding of the area involved does not have nerve endings and there is no pain sensation.  The rubber band cuts off the blood supply to the hemorrhoid and the band may fall off as soon as 48 hours after the banding (the band may occasionally be seen in the toilet bowl following a bowel movement). You may notice a temporary feeling of fullness in the rectum which should respond adequately to plain Tylenol? or Motrin?. ? ?Following the banding, avoid strenuous exercise that evening and resume full activity the next day.  A sitz bath (soaking in a warm tub) or bidet is soothing, and can be useful for cleansing the area after bowel movements.   ? ? ?To avoid constipation, take two tablespoons of natural wheat bran, natural oat bran, flax, Benefiber? or any over the counter fiber supplement and increase your water intake to 7-8 glasses daily.   ? ?Unless you have been prescribed anorectal medication, do not put anything inside your rectum for two weeks: No suppositories, enemas, fingers, etc. ? ?Occasionally, you may have more bleeding than usual after the banding procedure.  This is often from the untreated hemorrhoids rather than the treated one.  Don?t be concerned if there is a tablespoon or so of blood.  If there is more blood than this, lie flat with your bottom higher than your head and apply an ice pack to the area. If the bleeding does not stop within a half an hour or if you feel faint, call our office at [phone] or go to the emergency room. ? ?Problems are not common; however, if there is a substantial amount of bleeding, severe pain, chills, fever or difficulty passing urine (very rare) or other problems, you should call us at [phone] or report to the nearest emergency room. ? ?Do not stay seated continuously for more than 2-3 hours for a day or two after the procedure.   Tighten your buttock muscles 10-15 times every two hours and take 10-15 deep breaths every 1-2 hours.  Do not spend more than a few minutes on the toilet if you cannot empty your bowel; instead re-visit the toilet at a later time. ? ? I appreciate the  opportunity to care for you ? ?Thank You  ? ?Kavitha Nandigam , MD  ?

## 2021-11-10 ENCOUNTER — Telehealth: Payer: Self-pay | Admitting: Gastroenterology

## 2021-11-10 NOTE — Telephone Encounter (Signed)
Patient had two appointments scheduled for a banding, she needed to cancel her Jan appointment

## 2021-11-10 NOTE — Telephone Encounter (Signed)
Inbound call from patient, states that she would like to speak with you. Also wants to keep her 12/13 11:20 appointment. Please advise.

## 2021-11-17 ENCOUNTER — Ambulatory Visit (INDEPENDENT_AMBULATORY_CARE_PROVIDER_SITE_OTHER): Payer: PRIVATE HEALTH INSURANCE | Admitting: Gastroenterology

## 2021-11-17 ENCOUNTER — Encounter: Payer: Self-pay | Admitting: Gastroenterology

## 2021-11-17 VITALS — BP 112/76 | HR 99 | Ht 59.75 in | Wt 155.0 lb

## 2021-11-17 DIAGNOSIS — K641 Second degree hemorrhoids: Secondary | ICD-10-CM

## 2021-11-17 DIAGNOSIS — K5902 Outlet dysfunction constipation: Secondary | ICD-10-CM

## 2021-11-17 DIAGNOSIS — M6289 Other specified disorders of muscle: Secondary | ICD-10-CM

## 2021-11-17 NOTE — Progress Notes (Signed)
PROCEDURE NOTE: The patient presents with symptomatic grade II  hemorrhoids, requesting rubber band ligation of his/her hemorrhoidal disease.  All risks, benefits and alternative forms of therapy were described and informed consent was obtained.   The anorectum was pre-medicated with 0.125% NTG and Recticare The decision was made to band the Right anterior internal hemorrhoid, and the [place] was used to perform band ligation without complication.  Digital anorectal examination was then performed to assure proper positioning of the band, and to adjust the banded tissue as required.  The patient was discharged home without pain or other issues.  Dietary and behavioral recommendations were given and along with follow-up instructions.     The following adjunctive treatments were recommended: Continue Benefiber 1 tablespoon with meals 2-3 times daily Will refer to pelvic floor physical therapy for pelvic floor dysfunction and to improve anal sphincter tone. 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 Tarri Glenn for IBS diarrhea  The patient will return for follow-up and possible additional banding as needed No complications were encountered and the patient tolerated the procedure well.  Damaris Hippo , MD [phone]

## 2021-11-17 NOTE — Patient Instructions (Signed)
If you are age 56 or younger, your body mass index should be between 19-25. Your Body mass index is 30.53 kg/m. If this is out of the aformentioned range listed, please consider follow up with your Primary Care Provider.   ________________________________________________________  The [facility] providers would like to encourage you to use [facility] to communicate with providers for non-urgent requests or questions.  Due to long hold times on the telephone, sending your provider a message by [facility] may be a faster and more efficient way to get a response.  Please allow 48 business hours for a response.  Please remember that this is for non-urgent requests.  _______________________________________________________  Dennis Bast are scheduled to follow up with Dr Tarri Glenn on 12-22-21 at 340pm.  You have been referred to Pelvic Floor Therapy.  Someone will call you to schedule this appointment.  Thank you for choosing [facility]  Karleen Hampshire Nandigam,MD

## 2021-12-18 ENCOUNTER — Encounter: Payer: PRIVATE HEALTH INSURANCE | Admitting: Gastroenterology

## 2021-12-22 ENCOUNTER — Ambulatory Visit: Payer: PRIVATE HEALTH INSURANCE | Admitting: Gastroenterology

## 2022-01-04 ENCOUNTER — Ambulatory Visit: Payer: PRIVATE HEALTH INSURANCE | Attending: Gastroenterology | Admitting: Physical Therapy

## 2022-01-04 ENCOUNTER — Other Ambulatory Visit: Payer: Self-pay

## 2022-01-04 ENCOUNTER — Encounter: Payer: Self-pay | Admitting: Physical Therapy

## 2022-01-04 DIAGNOSIS — K5902 Outlet dysfunction constipation: Secondary | ICD-10-CM | POA: Insufficient documentation

## 2022-01-04 DIAGNOSIS — R278 Other lack of coordination: Secondary | ICD-10-CM | POA: Diagnosis not present

## 2022-01-04 DIAGNOSIS — M6281 Muscle weakness (generalized): Secondary | ICD-10-CM | POA: Diagnosis not present

## 2022-01-04 NOTE — Therapy (Signed)
[facility] @ [place], [place], [address] Phone: [phone]   Fax:  [phone]  Physical Therapy Evaluation  Patient Details  Name: Amy Mcintyre MRN: [id number] Date of Birth: February 21, 1966 Referring Provider (PT): Dr. Harl Bowie   Encounter Date: 01/04/2022   PT End of Session - 01/04/22 1052     Visit Number 1    Date for PT Re-Evaluation 03/29/22    Authorization Type [employer]    PT Start Time [id number]    PT Stop Time 0930    PT Time Calculation (min) 45 min    Activity Tolerance Patient tolerated treatment well    Behavior During Therapy [facility] for tasks assessed/performed             Past Medical History:  Diagnosis Date   Anxiety    Cataracts, bilateral    Colitis    Depression    Eosinophilic esophagitis    Fatty liver    Gastritis    GERD (gastroesophageal reflux disease)    Hyperlipidemia    Thyroid disease    TMJ (dislocation of temporomandibular joint)     Past Surgical History:  Procedure Laterality Date   BREAST ENHANCEMENT SURGERY     CESAREAN SECTION     NASAL SINUS SURGERY     tummy tuck     WRIST SURGERY Left     There were no vitals filed for this visit.    Subjective Assessment - 01/04/22 0849     Subjective I had three internal hemorroids removed. I have had diarrhea. June last year she had one time of fecal incontinence. No straining for a bowel movement. She can feel the urge to have a bowel movement. Stool goes between Type 6 and 7. No difference with the Benefiber. Uses immodium to reduce the diarrhea. Patient has had abdominal pian, abdomen is very harrd and will be having more test. Last several moths the abdominal pain is worse and gained 20 pounds. Starts to wake up at 4 to have her first bowel movement and will have 4-6 BM per day. Fully empty bowels.    Patient Stated Goals reduce leakage and improve anal tone    Currently in Pain? Yes    Pain  Score 8    end of day   Pain Location Abdomen    Pain Orientation Anterior    Pain Descriptors / Indicators Pressure    Pain Type Acute pain    Pain Onset More than a month ago    Pain Frequency Intermittent    Aggravating Factors  as the day goes on gets worse.    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 Harl Bowie    Onset Date/Surgical Date --   05/06/2021   Prior Therapy none      Precautions   Precautions None      Restrictions   Weight Bearing Restrictions No      Balance Screen   Has the patient fallen in the past 6 months No    Has the patient had a decrease in activity level because of a fear of falling?  No    Is the patient reluctant to leave their home because  of a fear of falling?  No      Prior Function   Level of Independence Independent    Vocation Full time employment    Vocation Requirements meetings and needs to take immodium for them    Leisure walking and does not exercise much due to abdominal pain      Cognition   Overall Cognitive Status Within Functional Limits for tasks assessed      Observation/Other Assessments   Skin Integrity good mobility of the tummy tuck and c-section scar      Posture/Postural Control   Posture/Postural Control No significant limitations      ROM / Strength   AROM / PROM / Strength AROM;PROM;Strength      Strength   Right Hip ABduction 4/5    Left Hip ABduction 4/5      Palpation   Palpation comment ifrmness of abdomen                        Objective measurements completed on examination: See above findings.     Pelvic Floor Special Questions - 01/04/22 0001     Prior Pregnancies Yes    Number of C-Sections 1   1 miscarriage   Urinary Leakage No    Urinary urgency --   stool urgency   Fecal incontinence Yes   1 time  in une   Caffeine beverages 1 cup of coffee per day and 1 6 oz. can of coke on occasion    Skin Integrity Hemorroids;Intact;Other    Skin Integrity other skin tags around the anus    External Palpation no tenderness    Pelvic Floor Internal Exam Patient confirms identification and approves PT to assess pelvic floor and treatment    Exam Type Rectal    Palpation the puborectalis muscle did not come forward, tightness in the anococcygeal ligament, decreased contraction of the anterior portion of the anus    Strength weak squeeze, no lift   after manual work was 3/5             [facility] Adult PT Treatment/Exercise - 01/04/22 0001       Self-Care   Self-Care Other Self-Care Comments    Other Self-Care Comments  abdominal massage and stimulation of the lymph system      Manual Therapy   Manual Therapy Myofascial release;Internal Pelvic Floor    Myofascial Release release of the right lower abdomen by the ilicecal valve    Internal Pelvic Floor release of the anococcygeal ligament                     PT Education - 01/04/22 1023     Education Details Access Code: [email or web address]; abodminal massage, lymph system stimulation    Person(s) Educated Patient    Methods Explanation;Demonstration;Verbal cues;Handout    Comprehension Returned demonstration;Verbalized understanding              PT Short Term Goals - 01/04/22 1045       PT SHORT TERM GOAL #1   Title independent with initial HEP for pelvic floor strengthening    Time 4    Period Weeks    Status New    Target Date 02/01/22      PT SHORT TERM GOAL #2   Title educated on fiber to improve consistency of stool to reduce diarrhea    Time 4    Period Weeks    Status New    Target  Date 02/01/22      PT SHORT TERM GOAL #3   Title educated on abdominal massage to reduce bloating of the abdomen and improve stool consistency    Time 4    Period Weeks    Status New    Target Date 02/01/22               PT  Long Term Goals - 01/04/22 1047       PT LONG TERM GOAL #1   Title independent with advanced HEP for pelvic floor strength and core strength    Time 12    Period Weeks    Status New    Target Date 03/29/22      PT LONG TERM GOAL #2   Title pelvic floor strength 3/5 with puborectalis coming forward to reduce stool leakage    Time 12    Period Weeks    Status New    Target Date 03/29/22      PT LONG TERM GOAL #3   Title able to engage the lower abdomen to improve pelvic floor strength and toileting    Time 12    Period Weeks    Target Date 03/29/22      PT LONG TERM GOAL #4   Title firmness of the abdomen decreased >/= 50% due to improved mobility of the tissue and reduction of pain    Time 12    Period Weeks    Status New    Target Date 03/29/22                    Plan - 01/04/22 1024     Clinical Impression Statement Patient is a 56 year old female with dyssynergic defecation since 05/06/2021. Patient reports she had fecal incontinence last June when she was trying to make it to the commode. Now she will have to wipe several times due to stool on the toilet paper. Patient stool is Type 6 and 7. She will have a bowel movement 4-6 times per day.She will fully empty her stool. She will use immodium when she has meetings due to the frequent bowel movements. Patient abdominal pain is 8/10 as the day progresses and the abdomen will get firm and larger. She has to wear maternity pants due to the abdomen increasing in size as the day progresses and not able to tolerate pressure on the abdomen. Patient has not been exercising due to her abdomen pain. Rectal strength is 2/5 with decreased anterior movement of the puborectalis and tightness in the anococcygeal ligament. Patient will benefit from skilled therapy to improve pelvic floor coordination and strength while reducing abdominal pain and bloating to improve stools firmness.    Personal Factors and Comorbidities  Fitness;Comorbidity 3+    Comorbidities Tummy tuck; hemorroid removal; c-section    Examination-Activity Limitations Continence;Toileting    Stability/Clinical Decision Making Stable/Uncomplicated    Clinical Decision Making Low    Rehab Potential Excellent    PT Frequency 1x / week   every other week   PT Duration 12 weeks    PT Treatment/Interventions ADLs/Self Care Home Management;Biofeedback;Electrical Stimulation;Cryotherapy;Moist Heat;Therapeutic activities;Therapeutic exercise;Neuromuscular re-education;Patient/family education;Manual techniques;Manual lymph drainage;Taping    PT Next Visit Plan work on the abdomen and diaphragm to  improve tissue mobility, work on the intestines, pelvic floor contraction, lower abdomen, see if she had a CT scan of her abdomen    PT Home Exercise Plan Access Code: [facility]    Consulted and Agree with Plan of  Care Patient             Patient will benefit from skilled therapeutic intervention in order to improve the following deficits and impairments:  Decreased coordination, Increased fascial restricitons, Decreased activity tolerance, Pain, Decreased strength  Visit Diagnosis: Muscle weakness (generalized) - Plan: PT plan of care cert/re-cert  Other lack of coordination - Plan: PT plan of care cert/re-cert     Problem List Patient Active Problem List   Diagnosis Date Noted   Insomnia 09/22/2018   Severe recurrent major depression without psychotic features ([place]) 09/11/2018   Generalized anxiety disorder 09/11/2018    Earlie Counts, PT 01/04/22 10:54 AM   [facility] @ [place], [place], [address] Phone: [phone]   Fax:  [phone]  Name: Amy Mcintyre MRN: [id number] Date of Birth: 12/23/1965

## 2022-01-04 NOTE — Patient Instructions (Addendum)
About Abdominal Massage  Abdominal massage, also called external colon massage, is a self-treatment circular massage technique that can reduce and eliminate gas and ease constipation. The colon naturally contracts in waves in a clockwise direction starting from inside the right hip, moving up toward the ribs, across the belly, and down inside the left hip.  When you perform circular abdominal massage, you help stimulate your colons normal wave pattern of movement called peristalsis.  It is most beneficial when done after eating.  Positioning You can practice abdominal massage with oil while lying down, or in the shower with soap.  Some people find that it is just as effective to do the massage through clothing while sitting or standing.  How to Massage Start by placing your finger tips or knuckles on your right side, just inside your hip bone.  Make small circular movements while you move upward toward your rib cage.   Once you reach the bottom right side of your rib cage, take your circular movements across to the left side of the bottom of your rib cage.  Next, move downward until you reach the inside of your left hip bone.  This is the path your feces travel in your colon. Continue to perform your abdominal massage in this pattern for 10 minutes each day.     You can apply as much pressure as is comfortable in your massage.  Start gently and build pressure as you continue to practice.  Notice any areas of pain as you massage; areas of slight pain may be relieved as you massage, but if you have areas of significant or intense pain, consult with your healthcare provider.  Other Considerations General physical activity including bending and stretching can have a beneficial massage-like effect on the colon.  Deep breathing can also stimulate the colon because breathing deeply activates the same nervous system that supplies the colon.   Abdominal massage should always be used in combination with a  bowel-conscious diet that is high in the proper type of fiber for you, fluids (primarily water), and a regular exercise program.  Access Code: [facility] URL: [email or web address] Date: 01/04/2022 Prepared by: Earlie Counts  Exercises Seated Pelvic Floor Contraction - 3 x daily - 7 x weekly - 1 sets - 5 reps - 5 sec hold Quick Flick Pelvic Floor Contractions Seated - 3 x daily - 7 x weekly - 1 sets - 5 reps [facility] [address], [place], [place] [address] Phone # [phone] Fax [phone]

## 2022-01-05 ENCOUNTER — Ambulatory Visit (INDEPENDENT_AMBULATORY_CARE_PROVIDER_SITE_OTHER): Payer: PRIVATE HEALTH INSURANCE | Admitting: Gastroenterology

## 2022-01-05 ENCOUNTER — Encounter: Payer: Self-pay | Admitting: Gastroenterology

## 2022-01-05 ENCOUNTER — Other Ambulatory Visit (INDEPENDENT_AMBULATORY_CARE_PROVIDER_SITE_OTHER): Payer: PRIVATE HEALTH INSURANCE

## 2022-01-05 VITALS — BP 128/78 | HR 104 | Ht 59.75 in | Wt 157.2 lb

## 2022-01-05 DIAGNOSIS — K5902 Outlet dysfunction constipation: Secondary | ICD-10-CM

## 2022-01-05 DIAGNOSIS — R198 Other specified symptoms and signs involving the digestive system and abdomen: Secondary | ICD-10-CM

## 2022-01-05 DIAGNOSIS — K7581 Nonalcoholic steatohepatitis (NASH): Secondary | ICD-10-CM

## 2022-01-05 DIAGNOSIS — K591 Functional diarrhea: Secondary | ICD-10-CM

## 2022-01-05 DIAGNOSIS — K625 Hemorrhage of anus and rectum: Secondary | ICD-10-CM | POA: Diagnosis not present

## 2022-01-05 LAB — CBC WITH DIFFERENTIAL/PLATELET
Basophils Absolute: 0 10*3/uL (ref 0.0–0.1)
Basophils Relative: 0.6 % (ref 0.0–3.0)
Eosinophils Absolute: 0.1 10*3/uL (ref 0.0–0.7)
Eosinophils Relative: 1.2 % (ref 0.0–5.0)
HCT: 37.8 % (ref 36.0–46.0)
Hemoglobin: 12.5 g/dL (ref 12.0–15.0)
Lymphocytes Relative: 32.9 % (ref 12.0–46.0)
Lymphs Abs: 1.5 10*3/uL (ref 0.7–4.0)
MCHC: 33.1 g/dL (ref 30.0–36.0)
MCV: 91.6 fl (ref 78.0–100.0)
Monocytes Absolute: 0.4 10*3/uL (ref 0.1–1.0)
Monocytes Relative: 8.6 % (ref 3.0–12.0)
Neutro Abs: 2.7 10*3/uL (ref 1.4–7.7)
Neutrophils Relative %: 56.7 % (ref 43.0–77.0)
Platelets: 204 10*3/uL (ref 150.0–400.0)
RBC: 4.12 Mil/uL (ref 3.87–5.11)
RDW: 14.3 % (ref 11.5–15.5)
WBC: 4.7 10*3/uL (ref 4.0–10.5)

## 2022-01-05 LAB — COMPREHENSIVE METABOLIC PANEL
ALT: 91 U/L — ABNORMAL HIGH (ref 0–35)
AST: 53 U/L — ABNORMAL HIGH (ref 0–37)
Albumin: 4.4 g/dL (ref 3.5–5.2)
Alkaline Phosphatase: 75 U/L (ref 39–117)
BUN: 13 mg/dL (ref 6–23)
CO2: 28 mEq/L (ref 19–32)
Calcium: 9.3 mg/dL (ref 8.4–10.5)
Chloride: 104 mEq/L (ref 96–112)
Creatinine, Ser: 0.63 mg/dL (ref 0.40–1.20)
GFR: 99.37 mL/min (ref >60.00)
Glucose, Bld: 94 mg/dL (ref 70–99)
Potassium: 4 mEq/L (ref 3.5–5.1)
Sodium: 141 mEq/L (ref 135–145)
Total Bilirubin: 0.3 mg/dL (ref 0.2–1.2)
Total Protein: 7.2 g/dL (ref 6.0–8.3)

## 2022-01-05 NOTE — Patient Instructions (Addendum)
You have been scheduled for a CT scan of the abdomen and pelvis at [facility] ([address]---this is in the same building as [employer]).   You are scheduled on 01/08/22 at 11:30am. You should arrive 15 minutes prior to your appointment time for registration. Please follow the written instructions below on the day of your exam:  WARNING: IF YOU ARE ALLERGIC TO IODINE/X-RAY DYE, PLEASE NOTIFY RADIOLOGY IMMEDIATELY AT [phone]! YOU WILL BE GIVEN A 13 HOUR PREMEDICATION PREP.  1) Do not eat or drink anything after 7:30am (4 hours prior to your test) 2) You have been given 2 bottles of oral contrast to drink. The solution may taste better if refrigerated, but do NOT add ice or any other liquid to this solution. Shake well before drinking.    Drink 1 bottle of contrast @ 9:30am (2 hours prior to your exam)  Drink 1 bottle of contrast @ 10:30am (1 hour prior to your exam)  You may take any medications as prescribed with a small amount of water, if necessary. If you take any of the following medications: METFORMIN, GLUCOPHAGE, GLUCOVANCE, AVANDAMET, RIOMET, FORTAMET, [place] MET, JANUMET, GLUMETZA or METAGLIP, you MAY be asked to HOLD this medication 48 hours AFTER the exam.  The purpose of you drinking the oral contrast is to aid in the visualization of your intestinal tract. The contrast solution may cause some diarrhea. Depending on your individual set of symptoms, you may also receive an intravenous injection of x-ray contrast/dye. Plan on being at [facility] for 30 minutes or longer, depending on the type of exam you are having performed.  This test typically takes 30-45 minutes to complete.  If you have any questions regarding your exam or if you need to reschedule, you may call the CT department at [phone] between the hours of 8:00 am and 5:00 pm, Monday-Friday.   Your provider has requested that you go to the basement level for lab work before  leaving today. Press "B" on the elevator. The lab is located at the first door on the left as you exit the elevator.  Your Primary Care Physician recommended increasing your Pantoprazole to twice daily. Dr Tarri Glenn would like for you to continue this as well.    I know you are concerned about the changes in your body. I am concerned too.  I have recommended a CT scan for further evaluation. The CT scan will evaluate all of the areas that you are concerned about including the liver, pancreas, and lymph nodes. You will need to stop in the lab on the way out.   Given your hemorrhoids and diarrhea, I recommend that you: - Continue Benefiber 1 tablespoon with meals 2-3 times daily - Complete pelvic floor physical therapy for pelvic floor dysfunction and to improve anal sphincter tone.  Your recent [employer], a blood test to evaluate fatty liver, showed significant fat but no damage.  This is overall reassuring.  We should continue to perform a test like this every 12 to 24 months for close monitoring.  Unfortunately, there are no FDA-approved medications for fatty liver disease.   As you are aware, the most effective treatment so far for fatty liver disease does not involve medications, but rather lifestyle changes. The bad news is that these are typically hard to achieve and maintain for many people. Heres what we know helps:  - Lose weight. Weight loss of roughly 5% of your body weight might be enough to improve  abnormal liver tests and decrease the fat in the liver. Losing between 7% and 10% of body weight seems to decrease the amount of inflammation and injury to liver cells, and it may even reverse some of the damage of fibrosis. Target a gradual weight loss of 1 to 2 pounds per week, as very rapid weight loss may worsen inflammation and fibrosis. You may want to explore the option of weight loss surgery with your doctor, if you arent making any headway with weight loss and your health is  suffering. - It appears that aerobic exercise also leads to decreased fat in the liver, and with vigorous intensity, possibly also decreased inflammation independent of weight loss. - Eat well. Some studies suggest that the Mediterranean diet may also decrease the fat in the liver. This nutrition plan emphasizes fruits, vegetables, whole grains, legumes, nuts, replacing butter with olive or canola oil, limiting red meat, and eating more fish and lean poultry. - Drink coffee, maybe? Some studies showed that patients with NAFLD who drank coffee (about two cups every day) had a decreased risk in fibrosis. However, take into consideration the downsides of regular caffeine intake.  Even though it can be difficult to make these lifestyle changes and lose the weight, the benefit is immense if you have fatty liver, so give it your best effort! The greatest risk for people with a fatty liver is cardiovascular disease. Not only can some of these lifestyle changes improve or resolve your fatty liver, they will also help keep your heart healthy.     BMI:  If you are age 22 or older, your body mass index should be between 23-30. Your Body mass index is 30.96 kg/m. If this is out of the aforementioned range listed, please consider follow up with your Primary Care Provider.  If you are age 10 or younger, your body mass index should be between 19-25. Your Body mass index is 30.96 kg/m. If this is out of the aformentioned range listed, please consider follow up with your Primary Care Provider.   MY CHART:  The [facility] providers would like to encourage you to use [facility] to communicate with providers for non-urgent requests or questions.  Due to long hold times on the telephone, sending your provider a message by [facility] may be a faster and more efficient way to get a response.  Please allow 48 business hours for a response.  Please remember that this is for non-urgent requests.   Thank you for trusting me  with your gastrointestinal care!    Thornton Park, MD, MPH

## 2022-01-05 NOTE — Progress Notes (Signed)
Referring Provider: Tobe Sos, MD Primary Care Physician:  Tobe Sos, MD   Chief complaint:  Rectal bleeding   IMPRESSION:  Altered abdominal girth x 2-3 months with associated bloating and unintentional weight gain    - new symptoms since her last visit    - with the associated increase in the liver enzymes, cross-sectional imaging recommended Elevated transaminases    - new 10/22    - patient reports these have increased further since her last visit    - will repeat today, may be related to increased weight Diarrhea- IBS    - Chronic diarrhea with intermittent bloating     - initially thought to be related to hypothyroidism     - testing for celiac negative, giardia    - normal fecal calprotectin    - negative colon and duodenal biopsies    - not improved by probiotics    - breath test negative for SIBO by hydrogen or methane    - allergy testing 2020: cow's milk, string beans Bleeding internal hemorrhoids     - band ligation x 3 with Dr. Silverio Decamp    - continue to maximize medical therapy Pelvic Floor dysfunction with decreased sphincter tone    - Dr. Silverio Decamp recommended pelvic floor PT    - currently undergoing treatment Abnormal imaging of the liver    - CT abd/pelvis with contrast 04/18/2019: 2.7 cm focal lesion was identified in segment 8 of the liver       -  left parametrial vein which may be seen with pelvic venous congestion syndrome.      - MRI  04/24/2019:  liver focal nodular hyperplasia. A bone island was also seen.    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    - testing for viral hepatitis and autoimmune hepatitis negative    - Fibrotest: fibrosis stage of 1, necroinflammatory grade of A0-A1    - NASH FibroSURE 09/22/21: showed S3 (severe steatosis), F0 (no fibrosis), N2-  NASH Reflux (biopsy proven) on EGD 10/19/18    - resumed PPI given new-onset dysphonia Tubular adenoma 2019    - surveillance colonoscopy due 2024 Family history of colon polyps (father in his 59s)   PLAN: - CBC with diff, CMP - CT abd/pelvis with contrast to evaluate her altered abdominal girth - She declined a trial of Viberzi 100 mg BID for IBS-diarrhea with the hopes that PT might help in the meantime - Complete pelvic floor physical therapy for pelvic floor dysfunction and to improve anal sphincter tone - Continue Benefiber, drink at least 1.5-2 liters of water - Daily stool bulking agent with psyllium or methylcellulose recommended - Anusol HC 2.5% applied sparingly PR BID, may apply to a suppository - Continue pantoprazole 40 mg BID - prescribed by PCP due to recent hoarseness - Surveillance colonoscopy 2024 as previously planned - Office follow-up in 6-8 weeks, earlier if needed   HPI: Amy Mcintyre is a 56 y.o. female who returns in follow-up. She has had three hemorrhoidal band ligations performed for internal hemorrhoids with Dr. Silverio Decamp since her last office visit with me 09/22/21.   Prior office visits have also involved evaluation of abdominal pain, bloating, and chronic diarrhea attributed to functional diarrhea.  Evaluation included negative testing for celiac, Giardia,  and normal fecal calprotectin, normal colon and duodenal biopsies, and a negative SIBO breath test.  There was temporal association in improved GI symptoms with improved regulation of thyroid.  During that evaluation she was found to have fatty liver and a 2.7 cm focal lesion in segment 8 of the liver thought to be a [place] on prior imaging and later hepatic adenoma on subsequent imaging.   She returns today in scheduled follow-up. Although she continues to have some diarrhea and bleeding related to her hemorrhoids, her primary concern is of increasing abdominal girth with associated constant, uncomfortable bloating.  She feels like her abdomen is very hard. This is a marked change from her baseline. She has no edema.   When she reported these concerns to her primary care provider, she heard that she had just gained weight. However, she feels like when people she knows have gained weight it doesn't look like this. She has not changed her diet.  Her thyroid labs have been normal and she has not required any recent medication changes.   146 lb 09/22/21 155 lb 11/17/21 157 lb 01/04/22  Having a soft or watery bowel movement 3-4 times each day or varying volumes. Yellow, brown, and sometimes with mucous. No abdominal pain. No rectal pain. No straining.    Dr. Silverio Decamp recommended pelvic floor physical therapy for pelvic floor dysfunction and to improve anal sphincter tone. She started PT yesterday.   Her PCP recently increased her PPI to BID due to some hoarseness.   Her nephew is a Careers information officer at [employer] and has told her that that she needs an ultrasound and a CT to evaluate her liver, pancreas, and lymph nodes.   Remains at her stressful job. She had a poor review in December and was told to consider retiring.   Tearful and crying even though nothing is wrong despite antidepressant and antianxiety. Still dating her ex-husband and this relationship remains a positive in her life.    Recent labs show that her liver enzymes have now tripled - although I do not have access to those labs.  She also had stool studies. C diff, crypto, giardia were all negative 12/28/21.  NASH FibroSURE 09/22/21 showed S3 (marked or severe steatosis), F0 (no fibrosis), N2- NASH.   Her last colonoscopy 10/19/2018 showed left-sided diverticulosis, a small tubular adenoma in the descending colon, and nonbleeding internal hemorrhoids.  Random colon biopsies were normal.     Past Medical History:  Diagnosis Date   Anxiety    Cataracts, bilateral    Colitis    Depression    Eosinophilic esophagitis    Fatty liver    Gastritis     GERD (gastroesophageal reflux disease)    Hyperlipidemia    Thyroid disease    TMJ (dislocation of temporomandibular joint)     Past Surgical History:  Procedure Laterality Date   BREAST ENHANCEMENT SURGERY     CESAREAN SECTION     NASAL SINUS SURGERY     tummy tuck     WRIST SURGERY Left      Current Outpatient Medications  Medication Sig Dispense Refill   Ascorbic Acid (VITAMIN C) 100 MG tablet Take 100 mg by mouth daily.     atorvastatin (LIPITOR) 40 MG tablet Take 40 mg by mouth at bedtime.     baclofen (LIORESAL) 20 MG tablet Take 20 mg by mouth 3 (three) times daily as needed.     cetirizine (ZYRTEC) 10 MG tablet Take 1 tablet by mouth  daily at 2 PM.     cholecalciferol (VITAMIN D3) 25 MCG (1000 UNIT) tablet Take 1,000 Units by mouth daily.     clonazePAM (KLONOPIN) 1 MG tablet TAKE 1/2 TO 1 (ONE-HALF TO ONE) TABLET BY MOUTH THREE TIMES DAILY AS NEEDED FOR PANIC ATTACK OR INSOMNIA 90 tablet 5   gabapentin (NEURONTIN) 300 MG capsule Take 300 mg by mouth 3 (three) times daily.     hydrocortisone (ANUSOL-HC) 25 MG suppository Place 1 suppository (25 mg total) rectally 2 (two) times daily. 28 suppository 0   levothyroxine (SYNTHROID, LEVOTHROID) 75 MCG tablet Take 75 mcg by mouth daily.      metFORMIN (GLUCOPHAGE) 500 MG tablet Take 1 tablet daily with food for one week, then increase to 1 tablet BID with food 180 tablet 0   nystatin cream (MYCOSTATIN) Apply 1 application topically as needed.     pantoprazole (PROTONIX) 40 MG tablet Take 1 tablet by mouth 2 (two) times daily.     sertraline (ZOLOFT) 100 MG tablet Take 2 tablets (200 mg total) by mouth daily. 180 tablet 0   tacrolimus (PROTOPIC) 0.03 % ointment Apply topically 2 (two) times daily.     No current facility-administered medications for this visit.    Allergies as of 01/05/2022 - Review Complete 01/05/2022  Allergen Reaction Noted   Ambien [zolpidem tartrate]  09/22/2018   Erythromycin Swelling 09/22/2018    Penicillins Swelling 09/22/2018    Family History  Problem Relation Age of Onset   Anxiety disorder Mother    Panic disorder Mother    OCD Mother    Depression Mother    Anxiety disorder Father    Depression Father    Colon polyps Father    Drug abuse Brother    Depression Maternal Grandfather    Depression Paternal Grandmother    Colon cancer Neg Hx    Esophageal cancer Neg Hx    Pancreatic cancer Neg Hx    Stomach cancer Neg Hx    Liver disease Neg Hx    Rectal cancer Neg Hx     Physical Exam: General:   Alert,  well-nourished, pleasant and cooperative in NAD Head:  Normocephalic and atraumatic. Eyes:  Sclera clear, no icterus.   Conjunctiva pink. Abdomen:  Soft although more firm than in the past, increased abdominal girth compared to prior to extends, nontender, nondistended, normal bowel sounds, no rebound or guarding. No hepatosplenomegaly.  No fluid wave.  Rectal:   No chemical dermatitis. No external hemorrhoids, fissure or fistula. No prolapsing hemorrhoids. No rectal prolapse. Palpable internal hemorrhoids. Normal anocutaneous reflex. No stool or blood in the rectal vault. No mass or fecal impaction. Normal anal resting tone.  Chaperone: Sophia Msk:  Symmetrical. No boney deformities LAD: No inguinal or umbilical LAD Extremities:  No clubbing or edema. Neurologic:  Alert and  oriented x4;  grossly nonfocal Skin:  Intact without significant lesions or rashes. Psych:  Alert and cooperative. Normal mood and affect.  I spent 50 minutes, including in depth chart review, independent review of results, communicating results with the patient directly, face-to-face time with the patient, coordinating care, ordering studies and medications as appropriate, and documentation.    Jesse Hirst L. Tarri Glenn, MD, MPH 01/05/2022, 8:46 AM

## 2022-01-08 ENCOUNTER — Ambulatory Visit (INDEPENDENT_AMBULATORY_CARE_PROVIDER_SITE_OTHER)
Admission: RE | Admit: 2022-01-08 | Discharge: 2022-01-08 | Disposition: A | Discharge status: Home / Self Care | Payer: PRIVATE HEALTH INSURANCE | Source: Ambulatory Visit | Attending: Gastroenterology | Admitting: Gastroenterology

## 2022-01-08 ENCOUNTER — Other Ambulatory Visit: Payer: Self-pay

## 2022-01-08 DIAGNOSIS — K5902 Outlet dysfunction constipation: Secondary | ICD-10-CM

## 2022-01-08 DIAGNOSIS — K591 Functional diarrhea: Secondary | ICD-10-CM

## 2022-01-08 DIAGNOSIS — K7581 Nonalcoholic steatohepatitis (NASH): Secondary | ICD-10-CM

## 2022-01-08 DIAGNOSIS — R198 Other specified symptoms and signs involving the digestive system and abdomen: Secondary | ICD-10-CM

## 2022-01-08 DIAGNOSIS — K625 Hemorrhage of anus and rectum: Secondary | ICD-10-CM

## 2022-01-08 MED ORDER — IOHEXOL 300 MG/ML  SOLN
100.0000 mL | Freq: Once | INTRAMUSCULAR | Status: AC | PRN
  Administered 2022-01-08: 100 mL via INTRAVENOUS

## 2022-01-11 ENCOUNTER — Other Ambulatory Visit: Payer: Self-pay

## 2022-01-11 DIAGNOSIS — R197 Diarrhea, unspecified: Secondary | ICD-10-CM

## 2022-01-11 DIAGNOSIS — R935 Abnormal findings on diagnostic imaging of other abdominal regions, including retroperitoneum: Secondary | ICD-10-CM

## 2022-01-11 DIAGNOSIS — R109 Unspecified abdominal pain: Secondary | ICD-10-CM

## 2022-01-11 DIAGNOSIS — K625 Hemorrhage of anus and rectum: Secondary | ICD-10-CM

## 2022-01-12 ENCOUNTER — Telehealth: Payer: Self-pay | Admitting: Gastroenterology

## 2022-01-12 ENCOUNTER — Encounter: Payer: Self-pay | Admitting: Gastroenterology

## 2022-01-12 NOTE — Telephone Encounter (Signed)
Per Elmyra Ricks from [facility] after reviewing pt's clinical notes the colonoscopy does not meet criteria. Based on information the pt would need a colonoscopy in 7 years from last procedure.   Please advise on what to do next, if a peer to peer or reconsideration needs to be initiated.

## 2022-01-12 NOTE — Telephone Encounter (Signed)
Case has been approved.

## 2022-01-12 NOTE — Telephone Encounter (Signed)
I just submitted CT scan results, will await on response

## 2022-01-13 ENCOUNTER — Other Ambulatory Visit: Payer: Self-pay

## 2022-01-13 DIAGNOSIS — R110 Nausea: Secondary | ICD-10-CM

## 2022-01-13 MED ORDER — ONDANSETRON HCL 4 MG PO TABS: ORAL_TABLET | ORAL | 0 refills | Status: DC

## 2022-01-14 ENCOUNTER — Encounter: Payer: Self-pay | Admitting: Gastroenterology

## 2022-01-14 ENCOUNTER — Encounter: Payer: Self-pay | Admitting: Psychiatry

## 2022-01-14 ENCOUNTER — Other Ambulatory Visit: Payer: PRIVATE HEALTH INSURANCE

## 2022-01-14 ENCOUNTER — Telehealth (INDEPENDENT_AMBULATORY_CARE_PROVIDER_SITE_OTHER): Payer: PRIVATE HEALTH INSURANCE | Admitting: Psychiatry

## 2022-01-14 ENCOUNTER — Ambulatory Visit (AMBULATORY_SURGERY_CENTER): Payer: PRIVATE HEALTH INSURANCE | Admitting: Gastroenterology

## 2022-01-14 VITALS — BP 117/68 | HR 78 | Temp 98.7°F | Resp 16 | Ht 59.75 in | Wt 157.0 lb

## 2022-01-14 DIAGNOSIS — D122 Benign neoplasm of ascending colon: Secondary | ICD-10-CM

## 2022-01-14 DIAGNOSIS — K648 Other hemorrhoids: Secondary | ICD-10-CM | POA: Diagnosis not present

## 2022-01-14 DIAGNOSIS — K5289 Other specified noninfective gastroenteritis and colitis: Secondary | ICD-10-CM

## 2022-01-14 DIAGNOSIS — R1084 Generalized abdominal pain: Secondary | ICD-10-CM | POA: Diagnosis not present

## 2022-01-14 DIAGNOSIS — F411 Generalized anxiety disorder: Secondary | ICD-10-CM | POA: Diagnosis not present

## 2022-01-14 DIAGNOSIS — F330 Major depressive disorder, recurrent, mild: Secondary | ICD-10-CM

## 2022-01-14 DIAGNOSIS — R935 Abnormal findings on diagnostic imaging of other abdominal regions, including retroperitoneum: Secondary | ICD-10-CM | POA: Diagnosis not present

## 2022-01-14 DIAGNOSIS — F5101 Primary insomnia: Secondary | ICD-10-CM

## 2022-01-14 MED ORDER — SERTRALINE HCL 100 MG PO TABS: 200.0000 mg | ORAL_TABLET | Freq: Every day | ORAL | 1 refills | Status: DC

## 2022-01-14 MED ORDER — SODIUM CHLORIDE 0.9 % IV SOLN: 500.0000 mL | Freq: Once | INTRAVENOUS | Status: DC

## 2022-01-14 MED ORDER — BREXPIPRAZOLE 1 MG PO TABS: 1.0000 mg | ORAL_TABLET | Freq: Every day | ORAL | 1 refills | Status: DC

## 2022-01-14 MED ORDER — CLONAZEPAM 1 MG PO TABS: ORAL_TABLET | ORAL | 5 refills | Status: DC

## 2022-01-14 NOTE — Patient Instructions (Signed)
Thank you for allowing [place] to care for you today! Await final biopsy results, approximately 2 weeks. Resume previous diet and medications today.   Recommend high fiber diet.  Hand out given. Return to normal daily activities tomorrow.       YOU HAD AN ENDOSCOPIC PROCEDURE TODAY AT [facility]:   Refer to the procedure report that was given to you for any specific questions about what was found during the examination.  If the procedure report does not answer your questions, please call your gastroenterologist to clarify.  If you requested that your care partner not be given the details of your procedure findings, then the procedure report has been included in a sealed envelope for you to review at your convenience later.  YOU SHOULD EXPECT: Some feelings of bloating in the abdomen. Passage of more gas than usual.  Walking can help get rid of the air that was put into your GI tract during the procedure and reduce the bloating. If you had a lower endoscopy (such as a colonoscopy or flexible sigmoidoscopy) you may notice spotting of blood in your stool or on the toilet paper. If you underwent a bowel prep for your procedure, you may not have a normal bowel movement for a few days.  Please Note:  You might notice some irritation and congestion in your nose or some drainage.  This is from the oxygen used during your procedure.  There is no need for concern and it should clear up in a day or so.  SYMPTOMS TO REPORT IMMEDIATELY:  Following lower endoscopy (colonoscopy or flexible sigmoidoscopy):  Excessive amounts of blood in the stool  Significant tenderness or worsening of abdominal pains  Swelling of the abdomen that is new, acute  Fever of 100F or higher    For urgent or emergent issues, a gastroenterologist can be reached at any hour by calling [phone]. Do not use MyChart messaging for urgent concerns.    DIET:  We do recommend a small meal at first, but then you  may proceed to your regular diet.  Drink plenty of fluids but you should avoid alcoholic beverages for 24 hours.  ACTIVITY:  You should plan to take it easy for the rest of today and you should NOT DRIVE or use heavy machinery until tomorrow (because of the sedation medicines used during the test).    FOLLOW UP: Our staff will call the number listed on your records 48-72 hours following your procedure to check on you and address any questions or concerns that you may have regarding the information given to you following your procedure. If we do not reach you, we will leave a message.  We will attempt to reach you two times.  During this call, we will ask if you have developed any symptoms of COVID 19. If you develop any symptoms (ie: fever, flu-like symptoms, shortness of breath, cough etc.) before then, please call [phone].  If you test positive for Covid 19 in the 2 weeks post procedure, please call and report this information to [place].    If any biopsies were taken you will be contacted by phone or by letter within the next 1-3 weeks.  Please call us at [phone] if you have not heard about the biopsies in 3 weeks.    SIGNATURES/CONFIDENTIALITY: You and/or your care partner have signed paperwork which will be entered into your electronic medical record.  These signatures attest to the fact that that the information above  on your After Visit Summary has been reviewed and is understood.  Full responsibility of the confidentiality of this discharge information lies with you and/or your care-partner.

## 2022-01-14 NOTE — Progress Notes (Signed)
Amy Mcintyre [id number] 09/19/1966 56 y.o.  Virtual Visit via Video Note  I connected with pt @ on 01/14/22 at  4:00 PM EST by a video enabled telemedicine application and verified that I am speaking with the correct person using two identifiers.   I discussed the limitations of evaluation and management by telemedicine and the availability of in person appointments. The patient expressed understanding and agreed to proceed.  I discussed the assessment and treatment plan with the patient. The patient was provided an opportunity to ask questions and all were answered. The patient agreed with the plan and demonstrated an understanding of the instructions.   The patient was advised to call back or seek an in-person evaluation if the symptoms worsen or if the condition fails to improve as anticipated.  I provided 30 minutes of non-face-to-face time during this encounter.  The patient was located at home.  The provider was located at [place].   Thayer Headings, PMHNP   Subjective:   Patient ID:  Amy Mcintyre is a 56 y.o. (DOB 1966-01-28) female.  Chief Complaint:  Chief Complaint  Patient presents with   Follow-up    Anxiety, depression, insomnia    HPI Amy Mcintyre presents for follow-up of depression, anxiety, and insomnia. She reports work stress. Reports that her supervisor gave her a negative performance review in December and frequently inquires and talks about her mental health. She reports that her boss has commented that she needs to not show emotion at work. She reports that she has occasionally cried in the bathroom at work and has been trying to not show emotions at work. She reports occasional panic attacks at work when she is feeling overwhelmed, ie. in response to medical appointments, multiple scheduled meetings at work that interfere with her being able to complete required tasks, and unexpected changes. She reports that her anxiety is mostly work-related and  sometimes in response to stressors outside of work.  She reports that she falls asleep without difficulty and then will have intermittent awakenings after several hours. Appetite has been ok and denies any change in food intake. Concentration is ok unless she is experiencing panic. Denies anhedonia. Denies SI.   She reports that her depression has been "a lot better." She reports that she has occasionally "felt more emotional" and that excessive crying has resolved. Energy and motivation have been lower and reports that this is also affected by GI discomfort.   She reports that her relationship has been going well.   She reports that she had a colonoscopy this morning. She reports that she had polyps removed and these were biopsied. She reports that she has been having severe abdominal distension and is having to wear maternity pants. She has internal hemorrhoids banded in November and December. She has been referred for pelvic floor therapy and has started this.   She reports that she had a sleep study and learns results next week.   Continues to see Rodena Goldmann, [facility].   She reports that she has been taking Klonopin 2-3 times daily when she is at work. Occasionally not taking it at night. Taking Gabapentin during the day for jaw pain and may not take it at night.   Past Psychiatric Medication Trials: Paxil Prozac Sertraline Nortriptyline Wellbutrin- not helpful for depression.  Doxepin BuSpar Abilify-increased heart rate Rexulti-Helpful for mood and anxiety Seroquel-adverse effects Gabapentin- excessive somnolence with TID prn Lunesta-not effective Ambien- parasomnias Trazodone-ineffective Belsomra-worsening insomnia Klonopin  Review of Systems:  Review of Systems  Gastrointestinal:  Positive for abdominal distention, abdominal pain and diarrhea.  Musculoskeletal:  Negative for gait problem.  Neurological:  Negative for tremors.  Psychiatric/Behavioral:         Please refer to  HPI   Medications: I have reviewed the patient's current medications.  Current Outpatient Medications  Medication Sig Dispense Refill   atorvastatin (LIPITOR) 40 MG tablet Take 40 mg by mouth at bedtime.     baclofen (LIORESAL) 20 MG tablet Take 20 mg by mouth 3 (three) times daily as needed.     cetirizine (ZYRTEC) 10 MG tablet Take 1 tablet by mouth daily at 2 PM.     cholecalciferol (VITAMIN D3) 25 MCG (1000 UNIT) tablet Take 1,000 Units by mouth daily.     gabapentin (NEURONTIN) 300 MG capsule Take 300 mg by mouth 3 (three) times daily.     hydrocortisone (ANUSOL-HC) 25 MG suppository Place 1 suppository (25 mg total) rectally 2 (two) times daily. (Patient taking differently: Place 25 mg rectally 2 (two) times daily as needed.) 28 suppository 0   levothyroxine (SYNTHROID, LEVOTHROID) 75 MCG tablet Take 75 mcg by mouth daily.      metFORMIN (GLUCOPHAGE) 500 MG tablet Take 1 tablet daily with food for one week, then increase to 1 tablet BID with food 180 tablet 0   nystatin cream (MYCOSTATIN) Apply 1 application topically as needed.     pantoprazole (PROTONIX) 40 MG tablet Take 1 tablet by mouth 2 (two) times daily.     tacrolimus (PROTOPIC) 0.03 % ointment Apply topically 2 (two) times daily.     Ascorbic Acid (VITAMIN C) 100 MG tablet Take 100 mg by mouth daily. (Patient not taking: Reported on 01/14/2022)     brexpiprazole (REXULTI) 1 MG TABS tablet Take 1 tablet (1 mg total) by mouth daily. 90 tablet 1   clonazePAM (KLONOPIN) 1 MG tablet TAKE 1/2 TO 1 (ONE-HALF TO ONE) TABLET BY MOUTH THREE TIMES DAILY AS NEEDED FOR PANIC ATTACK OR INSOMNIA 90 tablet 5   ondansetron (ZOFRAN) 4 MG tablet Take 1 tablet prior to beginning each prep dosage 10 tablet 0   sertraline (ZOLOFT) 100 MG tablet Take 2 tablets (200 mg total) by mouth daily. 180 tablet 1   No current facility-administered medications for this visit.    Medication Side Effects: None  Allergies:  Allergies  Allergen Reactions    Ambien [Zolpidem Tartrate]    Erythromycin Swelling   Penicillins Swelling    Past Medical History:  Diagnosis Date   Allergy    Anxiety    Cataracts, bilateral    Colitis    Depression    Eosinophilic esophagitis    Fatty liver    Gastritis    GERD (gastroesophageal reflux disease)    Hyperlipidemia    Thyroid disease    TMJ (dislocation of temporomandibular joint)     Family History  Problem Relation Age of Onset   Anxiety disorder Mother    Panic disorder Mother    OCD Mother    Depression Mother    Anxiety disorder Father    Depression Father    Colon polyps Father    Drug abuse Brother    Depression Maternal Grandfather    Depression Paternal Grandmother    Colon cancer Neg Hx    Esophageal cancer Neg Hx    Pancreatic cancer Neg Hx    Stomach cancer Neg Hx    Liver disease Neg Hx    Rectal cancer Neg Hx  Social History   Socioeconomic History   Marital status: Single    Spouse name: Not on file   Number of children: 1   Years of education: Not on file   Highest education level: Not on file  Occupational History   Occupation: MANAGER  Tobacco Use   Smoking status: Never   Smokeless tobacco: Never  Vaping Use   Vaping Use: Never used  Substance and Sexual Activity   Alcohol use: Yes    Comment: socially   Drug use: Never   Sexual activity: Not on file  Other Topics Concern   Not on file  Social History Narrative   Not on file   Social Determinants of Health   Financial Resource Strain: Not on file  Food Insecurity: Not on file  Transportation Needs: Not on file  Physical Activity: Not on file  Stress: Not on file  Social Connections: Not on file  Intimate Partner Violence: Not on file    Past Medical History, Surgical history, Social history, and Family history were reviewed and updated as appropriate.   Please see review of systems for further details on the patient's review from today.   Objective:   Physical Exam:  Wt 157 lb  (71.2 kg)    BMI 30.92 kg/m   Physical Exam Constitutional:      General: She is not in acute distress. Musculoskeletal:        General: No deformity.  Neurological:     Mental Status: She is alert and oriented to person, place, and time.     Coordination: Coordination normal.  Psychiatric:        Attention and Perception: Attention and perception normal. She does not perceive auditory or visual hallucinations.        Mood and Affect: Affect is not labile, blunt, angry or inappropriate.        Speech: Speech normal.        Behavior: Behavior normal.        Thought Content: Thought content normal. Thought content is not paranoid or delusional. Thought content does not include homicidal or suicidal ideation. Thought content does not include homicidal or suicidal plan.        Cognition and Memory: Cognition and memory normal.        Judgment: Judgment normal.     Comments: Insight intact Mood presents as less depressed. She is forward thinking.  Mood is anxious in response to work stress and health issues    Lab Review:     Component Value Date/Time   NA 141 01/05/2022 0938   K 4.0 01/05/2022 0938   CL 104 01/05/2022 0938   CO2 28 01/05/2022 0938   GLUCOSE 94 01/05/2022 0938   BUN 13 01/05/2022 0938   CREATININE 0.63 01/05/2022 0938   CALCIUM 9.3 01/05/2022 0938   PROT 7.2 01/05/2022 0938   ALBUMIN 4.4 01/05/2022 0938   AST 53 (H) 01/05/2022 0938   ALT 91 (H) 01/05/2022 0938   ALT 39 (H) 04/18/2019 0821   ALKPHOS 75 01/05/2022 0938   BILITOT 0.3 01/05/2022 0938       Component Value Date/Time   WBC 4.7 01/05/2022 0938   RBC 4.12 01/05/2022 0938   HGB 12.5 01/05/2022 0938   HCT 37.8 01/05/2022 0938   PLT 204.0 01/05/2022 0938   MCV 91.6 01/05/2022 0938   MCHC 33.1 01/05/2022 0938   RDW 14.3 01/05/2022 0938   LYMPHSABS 1.5 01/05/2022 0938   MONOABS 0.4 01/05/2022 0017  EOSABS 0.1 01/05/2022 0938   BASOSABS 0.0 01/05/2022 0938    No results found for: POCLITH,  LITHIUM   No results found for: PHENYTOIN, PHENOBARB, VALPROATE, CBMZ   .res Assessment: Plan:    Pt seen for 30 minutes and time spent reviewing treatment plan. She reports that her mood and anxiety have improved overall and she would like to continue current medications without changes. Discussed potential benefits, risks, and side effects of increasing Rexulti to 2 mg if needed to improve residual anxiety and depressive s/s and she reports that she would like to continue 1 mg dose at this time.  Discussed continued use of Metformin. Pt reports that she was experiencing diarrhea before starting Metformin. Reviewed weight overtime and discussed that wt gain stabilized after introduction of Metformin. Will continue Metformin at this time and discussed that she could stop Metformin if it seems to be contributing to diarrhea or GI provider recommends stopping it.  Continue Sertraline 200 mg po qd for anxiety and depression.  Continue Klonopin 1 mg 1/2-1 tab po TID prn anxiety or insomnia.  Pt to follow-up in approximately 3 months or sooner if clinically indicated.  Recommend continuing therapy.  Patient advised to contact office with any questions, adverse effects, or acute worsening in signs and symptoms.   Amy Mcintyre was seen today for follow-up.  Diagnoses and all orders for this visit:  Generalized anxiety disorder -     sertraline (ZOLOFT) 100 MG tablet; Take 2 tablets (200 mg total) by mouth daily. -     clonazePAM (KLONOPIN) 1 MG tablet; TAKE 1/2 TO 1 (ONE-HALF TO ONE) TABLET BY MOUTH THREE TIMES DAILY AS NEEDED FOR PANIC ATTACK OR INSOMNIA  Mild episode of recurrent major depressive disorder (HCC) -     sertraline (ZOLOFT) 100 MG tablet; Take 2 tablets (200 mg total) by mouth daily. -     brexpiprazole (REXULTI) 1 MG TABS tablet; Take 1 tablet (1 mg total) by mouth daily.  Primary insomnia -     clonazePAM (KLONOPIN) 1 MG tablet; TAKE 1/2 TO 1 (ONE-HALF TO ONE) TABLET BY MOUTH THREE  TIMES DAILY AS NEEDED FOR PANIC ATTACK OR INSOMNIA     Please see After Visit Summary for patient specific instructions.  Future Appointments  Date Time Provider [place]  01/18/2022  4:15 PM Monico Hoar, PT [facility] None  02/01/2022  4:15 PM Monico Hoar, PT [facility] None  02/17/2022  4:15 PM Monico Hoar, PT [facility] None  03/01/2022  8:00 AM Monico Hoar, PT [facility] None  03/15/2022  8:00 AM Monico Hoar, PT [facility] None    No orders of the defined types were placed in this encounter.     -------------------------------

## 2022-01-14 NOTE — Progress Notes (Signed)
Report to PACU, RN, vss, BBS= Clear.  

## 2022-01-14 NOTE — Progress Notes (Signed)
Referring Provider: Tobe Sos, MD Primary Care Physician:  Tobe Sos, MD  Reason for Procedure:  Abnormal CT scan   IMPRESSION:  Abnormal CT scan Appropriate candidate for monitored anesthesia care  PLAN: Colonoscopy in the [place] today   HPI: Amy Mcintyre is a 56 y.o. female presents for colonoscopy to evaluate an abnormal CT scan. Please see my 01/05/21 office visit for complete details. There has otherwise been no change in history or physical exam.   CT scan 01/08/22 showed: 1. No acute intra-abdominal findings to explain the patient's abdominal pain. 2. Submucosal fatty deposition in the terminal ileum and long segment of descending colon, which can be seen in the setting of chronic inflammatory bowel disease. 3. Colonic diverticulosis without findings of acute diverticulitis. 4. Severe diffuse hepatic steatosis with hepatomegaly. 5. Similar size of the enhancing 19 mm segment VII hepatic lesion, previously characterized as a benign hepatic adenoma on MRI August 07, 2019.   Past Medical History:  Diagnosis Date   Allergy    Anxiety    Cataracts, bilateral    Colitis    Depression    Eosinophilic esophagitis    Fatty liver    Gastritis    GERD (gastroesophageal reflux disease)    Hyperlipidemia    Thyroid disease    TMJ (dislocation of temporomandibular joint)     Past Surgical History:  Procedure Laterality Date   BREAST ENHANCEMENT SURGERY     CESAREAN SECTION     NASAL SINUS SURGERY     tummy tuck     WRIST SURGERY Left     Current Outpatient Medications  Medication Sig Dispense Refill   Ascorbic Acid (VITAMIN C) 100 MG tablet Take 100 mg by mouth daily.     baclofen (LIORESAL) 20 MG tablet Take 20 mg by mouth 3 (three) times daily as needed.     brexpiprazole (REXULTI) 1 MG TABS tablet Take 1 mg by mouth.     cetirizine (ZYRTEC) 10 MG tablet Take 1 tablet by mouth daily at 2 PM.     cholecalciferol (VITAMIN D3) 25 MCG (1000 UNIT)  tablet Take 1,000 Units by mouth daily.     clonazePAM (KLONOPIN) 1 MG tablet TAKE 1/2 TO 1 (ONE-HALF TO ONE) TABLET BY MOUTH THREE TIMES DAILY AS NEEDED FOR PANIC ATTACK OR INSOMNIA 90 tablet 5   gabapentin (NEURONTIN) 300 MG capsule Take 300 mg by mouth 3 (three) times daily.     levothyroxine (SYNTHROID, LEVOTHROID) 75 MCG tablet Take 75 mcg by mouth daily.      metFORMIN (GLUCOPHAGE) 500 MG tablet Take 1 tablet daily with food for one week, then increase to 1 tablet BID with food 180 tablet 0   ondansetron (ZOFRAN) 4 MG tablet Take 1 tablet prior to beginning each prep dosage 10 tablet 0   pantoprazole (PROTONIX) 40 MG tablet Take 1 tablet by mouth 2 (two) times daily.     sertraline (ZOLOFT) 100 MG tablet Take 2 tablets (200 mg total) by mouth daily. 180 tablet 0   atorvastatin (LIPITOR) 40 MG tablet Take 40 mg by mouth at bedtime. (Patient not taking: Reported on 01/14/2022)     hydrocortisone (ANUSOL-HC) 25 MG suppository Place 1 suppository (25 mg total) rectally 2 (two) times daily. 28 suppository 0   nystatin cream (MYCOSTATIN) Apply 1 application topically as needed.     tacrolimus (PROTOPIC) 0.03 % ointment Apply topically 2 (two) times daily.     Current Facility-Administered Medications  Medication Dose  Route Frequency Provider Last Rate Last Admin   0.9 %  sodium chloride infusion  500 mL Intravenous Once Thornton Park, MD        Allergies as of 01/14/2022 - Review Complete 01/14/2022  Allergen Reaction Noted   Ambien [zolpidem tartrate]  09/22/2018   Erythromycin Swelling 09/22/2018   Penicillins Swelling 09/22/2018    Family History  Problem Relation Age of Onset   Anxiety disorder Mother    Panic disorder Mother    OCD Mother    Depression Mother    Anxiety disorder Father    Depression Father    Colon polyps Father    Drug abuse Brother    Depression Maternal Grandfather    Depression Paternal Grandmother    Colon cancer Neg Hx    Esophageal cancer Neg Hx     Pancreatic cancer Neg Hx    Stomach cancer Neg Hx    Liver disease Neg Hx    Rectal cancer Neg Hx      Physical Exam: General:   Alert,  well-nourished, pleasant and cooperative in NAD Head:  Normocephalic and atraumatic. Eyes:  Sclera clear, no icterus.   Conjunctiva pink. Mouth:  No deformity or lesions.   Neck:  Supple; no masses or thyromegaly. Lungs:  Clear throughout to auscultation.   No wheezes. Heart:  Regular rate and rhythm; no murmurs. Abdomen:  Soft, non-tender, nondistended, normal bowel sounds, no rebound or guarding.  Msk:  Symmetrical. No boney deformities LAD: No inguinal or umbilical LAD Extremities:  No clubbing or edema. Neurologic:  Alert and  oriented x4;  grossly nonfocal Skin:  No obvious rash or bruise. Psych:  Alert and cooperative. Normal mood and affect.     Studies/Results: No results found.    Chino Sardo L. Tarri Glenn, MD, MPH 01/14/2022, 9:45 AM

## 2022-01-14 NOTE — Progress Notes (Signed)
Called to room to assist during endoscopic procedure.  Patient ID and intended procedure confirmed with present staff. Received instructions for my participation in the procedure from the performing physician.  

## 2022-01-14 NOTE — Op Note (Addendum)
[facility] Patient Name: Amy Mcintyre Procedure Date: 01/14/2022 9:44 AM MRN: [id number] Endoscopist: Thornton Park MD, MD Age: 56 Referring MD:  Date of Birth: 1965/12/21 Gender: Female Account #: [id number] Procedure:                Colonoscopy Indications:              Abnormal CT of the GI tract obtained to evaluate                            abdominal pain                           - submucosal fatty deposition in the terminal ileum                            and long segment of the descending colon                           Tubular adenoma removed on colonoscopy 2019                           Family history of colon polyps (father) Medicines:                Monitored Anesthesia Care Procedure:                Pre-Anesthesia Assessment:                           - Prior to the procedure, a History and Physical                            was performed, and patient medications and                            allergies were reviewed. The patient's tolerance of                            previous anesthesia was also reviewed. The risks                            and benefits of the procedure and the sedation                            options and risks were discussed with the patient.                            All questions were answered, and informed consent                            was obtained. Prior Anticoagulants: The patient has                            taken no previous anticoagulant or antiplatelet  agents. ASA Grade Assessment: II - A patient with                            mild systemic disease. After reviewing the risks                            and benefits, the patient was deemed in                            satisfactory condition to undergo the procedure.                           After obtaining informed consent, the colonoscope                            was passed under direct vision. Throughout the                             procedure, the patient's blood pressure, pulse, and                            oxygen saturations were monitored continuously. The                            Olympus CF-HQ190L (Serial# [id number]) Colonoscope was                            introduced through the anus and advanced to the 5                            cm into the ileum. A second forward view of the                            right colon was performed. The colonoscopy was                            performed without difficulty. The patient tolerated                            the procedure well. The quality of the bowel                            preparation was excellent. The terminal ileum,                            ileocecal valve, appendiceal orifice, and rectum                            were photographed. Scope In: 9:58:19 AM Scope Out: 10:13:07 AM Scope Withdrawal Time: 0 hours 12 minutes 54 seconds  Total Procedure Duration: 0 hours 14 minutes 48 seconds  Findings:                 The perianal and digital rectal examinations were  normal.                           Non-bleeding internal hemorrhoids were found.                           Multiple small and large-mouthed diverticula were                            found in the sigmoid colon and descending colon.                            There was no diverticulitis.                           A patchy area of mildly congested and erythematous                            mucosa was found from 24 cm to 36 cm proximal to                            the anus. Biopsies were taken with a cold forceps                            for histology. Estimated blood loss was minimal.                           The remainder of the examined colonic mucosa                            appeared normal including rectal sparing. Biopsies                            were taken with a cold forceps for histology.                            Estimated blood loss was minimal.                            A 2 mm polyp was found in the appendiceal orifice.                            The polyp was sessile. The polyp was removed with a                            cold snare. Resection and retrieval were complete.                            Estimated blood loss was minimal.                           The terminal ileum appeared normal. Biopsies were  taken with a cold forceps for histology. Estimated                            blood loss was minimal.                           The exam was otherwise without abnormality on                            direct and retroflexion views except for small                            internal hemorrhoids. Complications:            No immediate complications. Estimated blood loss:                            Minimal. Estimated Blood Loss:     Estimated blood loss was minimal. Impression:               - Non-bleeding internal hemorrhoids.                           - Diverticulosis in the sigmoid colon and in the                            descending colon.                           - Mild colitis from 26 to 36 cm proximal to the                            anus. Unclear if this is clinically significant or                            related to bowel prep. Biopsied.                           - One 2 mm polyp at the appendiceal orifice,                            removed with a cold snare. Resected and retrieved.                           - The examined colon and portion of the ileum was                            otherwise normal. Biopsied. Recommendation:           - Patient has a contact number available for                            emergencies. The signs and symptoms of potential                            delayed complications were discussed  with the                            patient. Return to normal activities tomorrow.                            Written discharge instructions were provided to the                             patient.                           - High fiber diet.                           - Continue present medications.                           - Await pathology results.                           - Repeat colonoscopy date to be determined after                            pending pathology results are reviewed for                            surveillance.                           - Emerging evidence supports eating a diet of                            fruits, vegetables, grains, calcium, and yogurt                            while reducing red meat and alcohol may reduce the                            risk of colon cancer. Thornton Park MD, MD 01/14/2022 10:23:41 AM This report has been signed electronically.

## 2022-01-18 ENCOUNTER — Telehealth: Payer: Self-pay

## 2022-01-18 ENCOUNTER — Other Ambulatory Visit: Payer: Self-pay

## 2022-01-18 ENCOUNTER — Other Ambulatory Visit: Payer: PRIVATE HEALTH INSURANCE

## 2022-01-18 ENCOUNTER — Ambulatory Visit: Payer: PRIVATE HEALTH INSURANCE | Attending: Gastroenterology | Admitting: Physical Therapy

## 2022-01-18 ENCOUNTER — Telehealth: Payer: Self-pay | Admitting: *Deleted

## 2022-01-18 ENCOUNTER — Encounter: Payer: Self-pay | Admitting: Physical Therapy

## 2022-01-18 ENCOUNTER — Telehealth: Payer: Self-pay | Admitting: Psychiatry

## 2022-01-18 DIAGNOSIS — R278 Other lack of coordination: Secondary | ICD-10-CM | POA: Insufficient documentation

## 2022-01-18 DIAGNOSIS — M6281 Muscle weakness (generalized): Secondary | ICD-10-CM | POA: Diagnosis not present

## 2022-01-18 DIAGNOSIS — R197 Diarrhea, unspecified: Secondary | ICD-10-CM

## 2022-01-18 DIAGNOSIS — R935 Abnormal findings on diagnostic imaging of other abdominal regions, including retroperitoneum: Secondary | ICD-10-CM

## 2022-01-18 DIAGNOSIS — R109 Unspecified abdominal pain: Secondary | ICD-10-CM

## 2022-01-18 NOTE — Telephone Encounter (Signed)
°  Follow up Call-  Call back number 01/14/2022  Post procedure Call Back phone  # [phone]  Permission to leave phone message Yes  Some recent data might be hidden     Patient questions:  Do you have a fever, pain , or abdominal swelling? No. Pain Score  0 *  Have you tolerated food without any problems? Yes.    Have you been able to return to your normal activities? Yes.    Do you have any questions about your discharge instructions: Diet   No. Medications  No. Follow up visit  No.  Do you have questions or concerns about your Care? No.  Actions: * If pain score is 4 or above: No action needed, pain <4.

## 2022-01-18 NOTE — Telephone Encounter (Signed)
LVM for patient to [facility].  ? If patient has checked other locations for medication.

## 2022-01-18 NOTE — Therapy (Addendum)
[place] @ [address] [place], [place], [address] Phone: [phone]   Fax:  [phone]  Physical Therapy Treatment  Patient Details  Name: Amy Mcintyre MRN: [id number] Date of Birth: 14-Jun-1966 Referring Provider (PT): Dr. Harl Bowie   Encounter Date: 01/18/2022   PT End of Session - 01/18/22 1703     Visit Number 2    Date for PT Re-Evaluation 03/29/22    Authorization Type [place]    PT Start Time 1615    PT Stop Time 1655    PT Time Calculation (min) 40 min    Activity Tolerance Patient tolerated treatment well    Behavior During Therapy [facility] for tasks assessed/performed             Past Medical History:  Diagnosis Date   Allergy    Anxiety    Cataracts, bilateral    Colitis    Depression    Eosinophilic esophagitis    Fatty liver    Gastritis    GERD (gastroesophageal reflux disease)    Hyperlipidemia    Thyroid disease    TMJ (dislocation of temporomandibular joint)     Past Surgical History:  Procedure Laterality Date   BREAST ENHANCEMENT SURGERY     CESAREAN SECTION     NASAL SINUS SURGERY     tummy tuck     WRIST SURGERY Left     There were no vitals filed for this visit.   Subjective Assessment - 01/18/22 1622     Subjective I am doing good. I have not had fecal leakage since last visit. I have had a normal bowel movement since the colonoscopy. The abdomen is still the same.    Patient Stated Goals reduce leakage and improve anal tone    Currently in Pain? No/denies    Multiple Pain Sites No                               [facility] Adult PT Treatment/Exercise - 01/18/22 0001       Lumbar Exercises: Supine   Ab Set 10 reps;3 seconds    AB Set Limitations with ball squeeze and pelvic floor contraction    Isometric Hip Flexion 10 reps;3 seconds    Isometric Hip Flexion Limitations verbal cues to contract abdomen              manual  work to the abdomen with myofascial release to go through all of the restrictions on the sides and lower abdomen Circular massage to the abdomen to improve peristalic motion of the intestines.  Earlie Counts, PT 02/01/22 5:08 PM        PT Education - 01/18/22 1658     Education Details Access Code: [email or web address]    Person(s) Educated Patient    Methods Explanation;Demonstration;Verbal cues;Handout    Comprehension Returned demonstration;Verbalized understanding              PT Short Term Goals - 01/04/22 1045       PT SHORT TERM GOAL #1   Title independent with initial HEP for pelvic floor strengthening    Time 4    Period Weeks    Status New    Target Date 02/01/22      PT SHORT TERM GOAL #2   Title educated on fiber to improve consistency of stool to reduce diarrhea    Time 4    Period Weeks  Status New    Target Date 02/01/22      PT SHORT TERM GOAL #3   Title educated on abdominal massage to reduce bloating of the abdomen and improve stool consistency    Time 4    Period Weeks    Status New    Target Date 02/01/22               PT Long Term Goals - 01/04/22 1047       PT LONG TERM GOAL #1   Title independent with advanced HEP for pelvic floor strength and core strength    Time 12    Period Weeks    Status New    Target Date 03/29/22      PT LONG TERM GOAL #2   Title pelvic floor strength 3/5 with puborectalis coming forward to reduce stool leakage    Time 12    Period Weeks    Status New    Target Date 03/29/22      PT LONG TERM GOAL #3   Title able to engage the lower abdomen to improve pelvic floor strength and toileting    Time 12    Period Weeks    Target Date 03/29/22      PT LONG TERM GOAL #4   Title firmness of the abdomen decreased >/= 50% due to improved mobility of the tissue and reduction of pain    Time 12    Period Weeks    Status New    Target Date 03/29/22                   Plan - 01/18/22 1648     Clinical  Impression Statement Patient abdomen size went down to 40.5 inches compared to her 43 inches she gets at home. She has difficulty with engaging her abdominals including the transverse abdominus and obliques. After manual work her abdomen was softer. Patient reports her stools this week is firmer. Patient continues to have more tests to understand why her stomach is so bloated.She has not had fecal leakage snce last visit. Patient will benefit from skilled therapy to improve pelvic floor coordination and strength while reducing abdominal pain and bloating to improve stools firmness.    Personal Factors and Comorbidities Fitness;Comorbidity 3+    Comorbidities Tummy tuck; hemorroid removal; c-section    Examination-Activity Limitations Continence;Toileting    Rehab Potential Excellent    PT Frequency 1x / week   every other week   PT Duration 12 weeks    PT Treatment/Interventions ADLs/Self Care Home Management;Biofeedback;Electrical Stimulation;Cryotherapy;Moist Heat;Therapeutic activities;Therapeutic exercise;Neuromuscular re-education;Patient/family education;Manual techniques;Manual lymph drainage;Taping    PT Next Visit Plan work on the abdomen and diaphragm to  improve tissue mobility, work on the intestines, [employer] hands into mat with abdominal contraction; oblique contraction with sidely and press in ball,    PT Home Exercise Plan Access Code: [facility]    Recommended Other Services MD signed initial evaluation    Consulted and Agree with Plan of Care Patient             Patient will benefit from skilled therapeutic intervention in order to improve the following deficits and impairments:  Decreased coordination, Increased fascial restricitons, Decreased activity tolerance, Pain, Decreased strength  Visit Diagnosis: Muscle weakness (generalized)  Other lack of coordination     Problem List Patient Active Problem List   Diagnosis Date Noted   Insomnia 09/22/2018   Severe  recurrent major depression without psychotic features ([place])  09/11/2018   Generalized anxiety disorder 09/11/2018    Earlie Counts, PT 01/18/22 5:04 PM  [place] @ [address] [place], [place], [address] Phone: [phone]   Fax:  [phone]  Name: Amy Mcintyre MRN: [id number] Date of Birth: 1966/10/08

## 2022-01-18 NOTE — Patient Instructions (Signed)
Access Code: [facility] URL: [email or web address] Date: 01/18/2022 Prepared by: Earlie Counts  Exercises Seated Pelvic Floor Contraction - 3 x daily - 7 x weekly - 1 sets - 5 reps - 5 sec hold Quick Flick Pelvic Floor Contractions Seated - 3 x daily - 7 x weekly - 1 sets - 5 reps Hooklying Transversus Abdominis Palpation - 1 x daily - 7 x weekly - 1 sets - 10 reps Hooklying Isometric Hip Flexion - 1 x daily - 7 x weekly - 1 sets - 10 reps [facility] [address], [place], [place] [address] Phone # [phone] Fax [phone]

## 2022-01-18 NOTE — Telephone Encounter (Signed)
Pt left a message that clonazapam is on back order and she doesn't know what to do. Please give her a call at [phone]

## 2022-01-18 NOTE — Telephone Encounter (Signed)
First attempt follow up call to pt, no answer. 

## 2022-01-19 ENCOUNTER — Telehealth: Payer: Self-pay | Admitting: Psychiatry

## 2022-01-19 NOTE — Telephone Encounter (Signed)
Pt called and said that the [facility] on [address] in [place], Amy Mcintyre has her klonopin in stock. Please send script there

## 2022-01-20 LAB — GI PROFILE, STOOL, PCR
Adenovirus F 40/41: NOT DETECTED
Astrovirus: NOT DETECTED
C difficile toxin A/B: DETECTED — AB
Campylobacter: NOT DETECTED
Cryptosporidium: NOT DETECTED
Cyclospora cayetanensis: NOT DETECTED
Entamoeba histolytica: NOT DETECTED
Enteroaggregative E coli: NOT DETECTED
Enteropathogenic E coli: NOT DETECTED
Enterotoxigenic E coli: NOT DETECTED
Giardia lamblia: NOT DETECTED
Norovirus GI/GII: NOT DETECTED
Plesiomonas shigelloides: NOT DETECTED
Rotavirus A: NOT DETECTED
Salmonella: NOT DETECTED
Sapovirus: NOT DETECTED
Shiga-toxin-producing E coli: NOT DETECTED
Shigella/Enteroinvasive E coli: NOT DETECTED
Vibrio cholerae: NOT DETECTED
Vibrio: NOT DETECTED
Yersinia enterocolitica: DETECTED — AB

## 2022-01-20 LAB — CALPROTECTIN, FECAL: Calprotectin, Fecal: 89 ug/g (ref 0–120)

## 2022-01-21 ENCOUNTER — Other Ambulatory Visit: Payer: Self-pay

## 2022-01-21 ENCOUNTER — Encounter: Payer: Self-pay | Admitting: Gastroenterology

## 2022-01-21 DIAGNOSIS — F411 Generalized anxiety disorder: Secondary | ICD-10-CM

## 2022-01-21 DIAGNOSIS — F5101 Primary insomnia: Secondary | ICD-10-CM

## 2022-01-21 MED ORDER — CLONAZEPAM 1 MG PO TABS: ORAL_TABLET | ORAL | 5 refills | Status: DC

## 2022-01-21 NOTE — Telephone Encounter (Signed)
Pended.

## 2022-01-25 ENCOUNTER — Other Ambulatory Visit: Payer: Self-pay

## 2022-01-25 DIAGNOSIS — R109 Unspecified abdominal pain: Secondary | ICD-10-CM

## 2022-01-25 MED ORDER — DOXYCYCLINE HYCLATE 100 MG PO TABS: 100.0000 mg | ORAL_TABLET | Freq: Two times a day (BID) | ORAL | 0 refills | Status: AC

## 2022-01-28 ENCOUNTER — Encounter: Payer: Self-pay | Admitting: Gastroenterology

## 2022-01-29 DIAGNOSIS — M26609 Unspecified temporomandibular joint disorder, unspecified side: Secondary | ICD-10-CM | POA: Insufficient documentation

## 2022-01-29 DIAGNOSIS — D369 Benign neoplasm, unspecified site: Secondary | ICD-10-CM | POA: Insufficient documentation

## 2022-01-29 DIAGNOSIS — K219 Gastro-esophageal reflux disease without esophagitis: Secondary | ICD-10-CM | POA: Insufficient documentation

## 2022-01-29 NOTE — Telephone Encounter (Signed)
Thanks for the help.

## 2022-01-30 NOTE — Progress Notes (Signed)
01/30/2022 Zameria Vogl [id number] 11-23-1966   Chief Complaint: Follow up stool studies   History of Present Illness: Amy Mcintyre is a 56 year old female with a past medical history of anxiety, depression, hyperlipidemia, hypothyroidism, fatty liver, GERD and chronic diarrhea followed by Dr. Tarri Glenn. She was seen in office by Dr. Tarri Glenn on 01/05/2022 for further evaluation regarding chronic diarrhea and abdominal gas bloat.  Abdominal/pelvic CT scan 01/08/2022 did not show any evidence of colitis or diverticulitis.  She underwent a colonoscopy 01/14/2022 which identified diverticulosis to the sigmoid and descending colon, mild colitis from 26 to 36 cm proximal to the anus (likely due to bowel prep) and one 2 mm polyp was removed at the appendiceal orifice.  Biopsies were negative for colitis and the biopsy of the 2 mm polyp removed from ascending colon showed benign colonic mucosa not a true colon polyp.  A fecal calprotectin level 01/18/2022 was normal.  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  Since her colonoscopy 01/14/2022 did not show any evidence of active C. difficile or Yersinia infection,  antibiotic treatment for these infections were not recommended.  She likely has had exposure to C. difficile in the past.  Yersinia enterocolitica typically does not require treatment. Dr. Tarri Glenn suspected she likely has SIBO and prescribed Doxycycline 100 mg p.o. twice daily for 2 weeks, she is finished 1 week of this treatment at this point.    She presents her office today to review her results of her colonoscopy and stool studies in further detail.  She does not have a clear understanding of why she is on Doxycycline.  I reviewed the above CT, colonoscopy and stool study results as noted above with the patient.  I answered all of her questions to the best of my ability.  I explained she was prescribed Doxycycline by Dr. Tarri Glenn for suspected small  intestinal bacterial overgrowth which can result in chronic diarrhea and abdominal gas bloat.  She denies any improvement since starting the doxycycline at this point.  She describes passing 2-6 brown nonbloody yellow-brown mushy bowel movements daily.  No abdominal pain.  She had questions regarding of colonic therapy and I advised against pursuing any colonic therapy which can be more harmful than beneficial.  She was seen by pelvic floor physical therapist who is massaging her abdomen to reduce the distention/firmness.  She stated her abdominal girth went from 42 to 44 inches down to 40-1/2 inches following 1 or 2 sessions of physical therapy.  She is scheduled to see her physical therapist later today.  She went on a dairy free diet for at least 2 weeks in the past without improvement.    She does use Splenda or Stevia 1 to 2 packets daily in her tea or coffee.  She eats eggs most morning for breakfast and rinks coffee.  Eats soup and/or sandwich for lunch and dinner.  Fruit and vegetable intake is limited.  She is taking Benefiber daily.   Colonoscopy 01/14/2022: Non-bleeding internal hemorrhoids. - Diverticulosis in the sigmoid colon and in the descending colon. - Mild colitis from 26 to 36 cm proximal to the anus. Unclear if this is clinically significant or related to bowel prep. Biopsied. - One 2 mm polyp at the appendiceal orifice, removed with a cold snare. Resected and retrieved. - The examined colon and portion of the ileum was otherwise normal. Biopsied. - Recall colonoscopy 5 years 1. Surgical [P], small bowel,  terminal ileum - ILEAL MUCOSA WITH NO SPECIFIC HISTOPATHOLOGIC CHANGES - NEGATIVE FOR ACUTE INFLAMMATION, FEATURES OF CHRONICITY OR GRANULOMAS 2. Surgical [P], right sided colon - COLONIC MUCOSA WITH NO SPECIFIC HISTOPATHOLOGIC CHANGES - NEGATIVE FOR ACUTE INFLAMMATION, FEATURES OF CHRONICITY, GRANULOMAS OR DYSPLASIA 3. Surgical [P], colon, ascending, polyp (1) -  COLONIC MUCOSA WITH NO SPECIFIC HISTOPATHOLOGIC CHANGES - NEGATIVE FOR ACUTE INFLAMMATION, FEATURES OF CHRONICITY, GRANULOMAS OR DYSPLASIA 4. Surgical [P], colon, transverse - COLONIC MUCOSA WITH NO SPECIFIC HISTOPATHOLOGIC CHANGES - NEGATIVE FOR ACUTE INFLAMMATION, FEATURES OF CHRONICITY, GRANULOMAS OR DYSPLASIA 5. Surgical [P], left sided colon - COLONIC MUCOSA WITH NO SPECIFIC HISTOPATHOLOGIC CHANGES - NEGATIVE FOR ACUTE INFLAMMATION, FEATURES OF CHRONICITY, GRANULOMAS OR DYSPLASIA 6. Surgical [P], sigmoid - at 36 cm - COLONIC MUCOSA WITH NO SPECIFIC HISTOPATHOLOGIC CHANGES - NEGATIVE FOR ACUTE INFLAMMATION, FEATURES OF CHRONICITY, GRANULOMAS OR DYSPLASIA 7. Surgical [P], colon, rectum - RECTAL MUCOSA WITH NO SPECIFIC HISTOPATHOLOGIC CHANGES - NEGATIVE FOR ACUTE INFLAMMATION, FEATURES OF CHRONICITY, GRANULOMAS OR DYSPLASIA  CT scan 01/08/22 showed: 1. No acute intra-abdominal findings to explain the patient's abdominal pain. 2. Submucosal fatty deposition in the terminal ileum and long segment of descending colon, which can be seen in the setting of chronic inflammatory bowel disease. 3. Colonic diverticulosis without findings of acute diverticulitis. 4. Severe diffuse hepatic steatosis with hepatomegaly. 5. Similar size of the enhancing 19 mm segment VII hepatic lesion, previously characterized as a benign hepatic adenoma on MRI August 07, 2019.  CBC Latest Ref Rng & Units 01/05/2022 12/13/2018  WBC 4.0 - 10.5 K/uL 4.7 4.6  Hemoglobin 12.0 - 15.0 g/dL 12.5 13.5  Hematocrit 36.0 - 46.0 % 37.8 40.6  Platelets 150.0 - 400.0 K/uL 204.0 258.0    CMP Latest Ref Rng & Units 01/05/2022 08/07/2019 04/18/2019  Glucose 70 - 99 mg/dL 94 - 111(H)  BUN 6 - 23 mg/dL 13 - -  Creatinine 0.40 - 1.20 mg/dL 0.63 0.70 -  Sodium 135 - 145 mEq/L 141 - -  Potassium 3.5 - 5.1 mEq/L 4.0 - -  Chloride 96 - 112 mEq/L 104 - -  CO2 19 - 32 mEq/L 28 - -  Calcium 8.4 - 10.5 mg/dL 9.3 - -  Total Protein 6.0 -  8.3 g/dL 7.2 - -  Total Bilirubin 0.2 - 1.2 mg/dL 0.3 - -  Alkaline Phos 39 - 117 U/L 75 - -  AST 0 - 37 U/L 53(H) - -  ALT 0 - 35 U/L 91(H) - 39(H)    Current Outpatient Medications on File Prior to Visit  Medication Sig Dispense Refill   Ascorbic Acid (VITAMIN C) 100 MG tablet Take 100 mg by mouth daily.     atorvastatin (LIPITOR) 40 MG tablet Take 40 mg by mouth at bedtime.     baclofen (LIORESAL) 20 MG tablet Take 20 mg by mouth 3 (three) times daily as needed.     brexpiprazole (REXULTI) 1 MG TABS tablet Take 1 tablet (1 mg total) by mouth daily. 90 tablet 1   cetirizine (ZYRTEC) 10 MG tablet Take 1 tablet by mouth daily at 2 PM.     cholecalciferol (VITAMIN D3) 25 MCG (1000 UNIT) tablet Take 1,000 Units by mouth daily.     clonazePAM (KLONOPIN) 1 MG tablet TAKE 1/2 TO 1 (ONE-HALF TO ONE) TABLET BY MOUTH THREE TIMES DAILY AS NEEDED FOR PANIC ATTACK OR INSOMNIA 90 tablet 5   doxycycline (VIBRA-TABS) 100 MG tablet Take 1 tablet (100 mg total) by mouth 2 (two) times  daily for 14 days. 28 tablet 0   gabapentin (NEURONTIN) 300 MG capsule Take 300 mg by mouth 3 (three) times daily.     hydrocortisone (ANUSOL-HC) 25 MG suppository Place 1 suppository (25 mg total) rectally 2 (two) times daily. (Patient taking differently: Place 25 mg rectally 2 (two) times daily as needed.) 28 suppository 0   levothyroxine (SYNTHROID, LEVOTHROID) 75 MCG tablet Take 75 mcg by mouth daily.      metFORMIN (GLUCOPHAGE) 500 MG tablet Take 1 tablet daily with food for one week, then increase to 1 tablet BID with food 180 tablet 0   nystatin cream (MYCOSTATIN) Apply 1 application topically as needed.     ondansetron (ZOFRAN) 4 MG tablet Take 1 tablet prior to beginning each prep dosage 10 tablet 0   pantoprazole (PROTONIX) 40 MG tablet Take 1 tablet by mouth 2 (two) times daily.     sertraline (ZOLOFT) 100 MG tablet Take 2 tablets (200 mg total) by mouth daily. 180 tablet 1   tacrolimus (PROTOPIC) 0.03 % ointment  Apply topically 2 (two) times daily.     No current facility-administered medications on file prior to visit.   Allergies  Allergen Reactions   Ambien [Zolpidem Tartrate]    Erythromycin Swelling   Penicillins Swelling    Current Medications, Allergies, Past Medical History, Past Surgical History, Family History and Social History were reviewed in [employer] record.  Review of Systems:   Constitutional: Negative for fever, sweats, chills or weight loss.  Respiratory: Negative for shortness of breath.   Cardiovascular: Negative for chest pain, palpitations and leg swelling.  Gastrointestinal: See HPI.  Musculoskeletal: Negative for back pain or muscle aches.  Neurological: Negative for dizziness, headaches or paresthesias.    Physical Exam: BP 126/80 (BP Location: Left Arm, Patient Position: Sitting, Cuff Size: Normal)    Pulse 88    Ht 4' 10.25" (1.48 m) Comment: height measured without shoes   Wt 158 lb 6 oz (71.8 kg)    BMI 32.82 kg/m  Wt Readings from Last 3 Encounters:  02/01/22 158 lb 6 oz (71.8 kg)  01/14/22 157 lb (71.2 kg)  01/05/22 157 lb 3.2 oz (71.3 kg)    General: Very pleasant 56 year old female in no acute distress. Head: Normocephalic and atraumatic. Eyes: No scleral icterus. Conjunctiva pink . Ears: Normal auditory acuity. Mouth: Dentition intact. No ulcers or lesions.  Lungs: Clear throughout to auscultation. Heart: Regular rate and rhythm, no murmur. Abdomen: Soft, nontender.  Mild generalized gaseous distention.  No masses or hepatomegaly. Normal bowel sounds x 4 quadrants.  Rectal: Deferred. Musculoskeletal: Symmetrical with no gross deformities. Extremities: No edema. Neurological: Alert oriented x 4. No focal deficits.  Psychological: Alert and cooperative. Normal mood and affect  Assessment and Recommendations:  50) 56 year old female with chronic diarrhea and abdominal bloat.  CTAP 01/08/2022 without evidence of colitis or  diverticulitis.  S/P colonoscopy 01/14/2022 showed mild colitis from 26 to 36 cm proximal to the anus, likely due to bowel prep.  Colon biopsies were negative for colitis.  GI pathogen panel 01/18/2022 was positive for C. Diff toxin A and B and Yersinia enterocolitica but treatment was not pursued as she did not have evidence of infectious colitis per colonoscopy.  Normal fecal calprotectin level 89.  Empirically prescribed doxycycline 100 mg p.o. twice daily for 14 days for SIBO. -Continue Doxycycline 100 mg p.o. twice daily to complete a 14-day treatment regimen as previously prescribed by Dr. Tarri Glenn -IBgard 1  p.o. twice daily, may start after completing course of Doxycycline -No artificial/seminatural sweeteners -Patient has a follow-up appointment with Dr. Tarri Glenn 03/01/2022.  2) Elevated LFTs. AST 53/ALT 91. CTAP showed severe hepatic steatosis, hepatomegaly and a stable 45mm liver lesion thought to be a benign hepatic adenoma per MRI in 2020 -Follow-up with Dr. Tarri Glenn regarding hepatic steatosis.  Patient has follow-up appoint with Dr. Tarri Glenn 03/01/2022.  Today's encounter was 25 minutes which included precharting, chart/result review, history/exam, face-to-face time used for counseling and formulating treatment plan with follow-up and documentation.

## 2022-02-01 ENCOUNTER — Encounter: Payer: Self-pay | Admitting: Physical Therapy

## 2022-02-01 ENCOUNTER — Ambulatory Visit (INDEPENDENT_AMBULATORY_CARE_PROVIDER_SITE_OTHER): Payer: PRIVATE HEALTH INSURANCE | Admitting: Nurse Practitioner

## 2022-02-01 ENCOUNTER — Ambulatory Visit: Payer: PRIVATE HEALTH INSURANCE | Admitting: Physical Therapy

## 2022-02-01 ENCOUNTER — Other Ambulatory Visit: Payer: Self-pay

## 2022-02-01 ENCOUNTER — Encounter: Payer: Self-pay | Admitting: Nurse Practitioner

## 2022-02-01 VITALS — BP 126/80 | HR 88 | Ht 58.25 in | Wt 158.4 lb

## 2022-02-01 DIAGNOSIS — R140 Abdominal distension (gaseous): Secondary | ICD-10-CM | POA: Diagnosis not present

## 2022-02-01 DIAGNOSIS — K529 Noninfective gastroenteritis and colitis, unspecified: Secondary | ICD-10-CM

## 2022-02-01 DIAGNOSIS — M6281 Muscle weakness (generalized): Secondary | ICD-10-CM

## 2022-02-01 DIAGNOSIS — R278 Other lack of coordination: Secondary | ICD-10-CM

## 2022-02-01 NOTE — Patient Instructions (Signed)
RECOMMENDATIONS:  Stop Stevia sweetener. Finish course of doxycycline May take over the counter Ibgard one capsule twice a day as needed for abdominal bloating/gas, wait until after you finish your antibiotic to start this. Keep your upcoming appointment with Dr. Tarri Glenn.  BMI:  If you are age 57 or older, your body mass index should be between 23-30. Your Body mass index is 32.82 kg/m. If this is out of the aforementioned range listed, please consider follow up with your Primary Care Provider.  If you are age 55 or younger, your body mass index should be between 19-25. Your Body mass index is 32.82 kg/m. If this is out of the aformentioned range listed, please consider follow up with your Primary Care Provider.   MY CHART:  The [facility] providers would like to encourage you to use [facility] to communicate with providers for non-urgent requests or questions.  Due to long hold times on the telephone, sending your provider a message by [facility] may be a faster and more efficient way to get a response.  Please allow 48 business hours for a response.  Please remember that this is for non-urgent requests.   Thank you for trusting me with your gastrointestinal care!    Noralyn Pick, CRNP

## 2022-02-01 NOTE — Patient Instructions (Signed)
Access Code: [facility] URL: [email or web address] Date: 02/01/2022 Prepared by: Earlie Counts  Exercises Seated Pelvic Floor Contraction - 3 x daily - 7 x weekly - 1 sets - 5 reps - 5 sec hold Quick Flick Pelvic Floor Contractions Seated - 3 x daily - 7 x weekly - 1 sets - 5 reps Hooklying Isometric Hip Flexion - 1 x daily - 7 x weekly - 1 sets - 10 reps Supine Transversus Abdominis Bracing - Hands on Ground - 1 x daily - 7 x weekly - 1 sets - 10 reps Sidelying Transversus Abdominis Bracing - 1 x daily - 7 x weekly - 1 sets - 10 reps [facility] [address] [place], [place] [address] Phone # [phone] Fax [phone]

## 2022-02-01 NOTE — Therapy (Signed)
[facility] @ [place], [place], [address] Phone: [phone]   Fax:  [phone]  Physical Therapy Treatment  Patient Details  Name: Amy Mcintyre MRN: [id number] Date of Birth: Apr 25, 1966 Referring Provider (PT): Dr. Harl Bowie   Encounter Date: 02/01/2022   PT End of Session - 02/01/22 1619     Visit Number 3    Date for PT Re-Evaluation 03/29/22    Authorization Type [place]    PT Start Time 1615    PT Stop Time 1655    PT Time Calculation (min) 40 min    Activity Tolerance Patient tolerated treatment well    Behavior During Therapy [facility] for tasks assessed/performed             Past Medical History:  Diagnosis Date   Allergy    Anxiety    Cataracts, bilateral    Colitis    Depression    Eosinophilic esophagitis    Fatty liver    Gastritis    GERD (gastroesophageal reflux disease)    Hyperlipidemia    Thyroid disease    TMJ (dislocation of temporomandibular joint)     Past Surgical History:  Procedure Laterality Date   BREAST ENHANCEMENT SURGERY     CESAREAN SECTION     NASAL SINUS SURGERY     tummy tuck     WRIST SURGERY Left     There were no vitals filed for this visit.   Subjective Assessment - 02/01/22 1619     Subjective I felt good for several days with a stomach that did not feel as tight. Stool samples came back for 3 different bacteria. Diagnosis is CIBO.    Patient is accompained by: Family member   husband   Patient Stated Goals reduce leakage and improve anal tone    Currently in Pain? No/denies    Multiple Pain Sites No                [facility] PT Assessment - 02/01/22 0001       Observation/Other Assessments-Edema    Edema --   abdominal circumference was 42.5 inches                          [facility] Adult PT Treatment/Exercise - 02/01/22 0001       Lumbar Exercises: Supine   Ab Set 10 reps;5 seconds    AB Set  Limitations with breath and pushing hands down into mat      Lumbar Exercises: Sidelying   Other Sidelying Lumbar Exercises sidely press ball into mat with abdominal contraction holding for 5 seconds 10x each side      Manual Therapy   Manual Therapy Myofascial release;Soft tissue mobilization    Manual therapy comments showed husband how to perform the manual work on his wifes abdomen to reduce the bloating and firmness    Soft tissue mobilization circular massage of the abdomen to improve peristalic motion; manual work to the abdomen    Myofascial Release release of the lower abdomen lifting the intestines off the bladder, release of the lateral abdomen with one hand on abdomen and other on back; release of the mesenteric root.; different techniques to reduce bloating and improve lymph system of the abdomen                     PT Education - 02/01/22 1658     Education Details  Access Code: [facility]; educated husband on how to perform abdominal work to reduce the firmess and bloating    Person(s) Educated Patient;Spouse    Methods Explanation;Demonstration;Handout    Comprehension Verbalized understanding;Returned demonstration              PT Short Term Goals - 02/01/22 1623       PT SHORT TERM GOAL #1   Title independent with initial HEP for pelvic floor strengthening    Time 4    Period Weeks    Status On-going      PT SHORT TERM GOAL #2   Title educated on fiber to improve consistency of stool to reduce diarrhea    Time 4    Period Weeks    Status Deferred      PT SHORT TERM GOAL #3   Title educated on abdominal massage to reduce bloating of the abdomen and improve stool consistency    Time 4    Period Weeks    Status Achieved               PT Long Term Goals - 01/04/22 1047       PT LONG TERM GOAL #1   Title independent with advanced HEP for pelvic floor strength and core strength    Time 12    Period Weeks    Status New    Target Date  03/29/22      PT LONG TERM GOAL #2   Title pelvic floor strength 3/5 with puborectalis coming forward to reduce stool leakage    Time 12    Period Weeks    Status New    Target Date 03/29/22      PT LONG TERM GOAL #3   Title able to engage the lower abdomen to improve pelvic floor strength and toileting    Time 12    Period Weeks    Target Date 03/29/22      PT LONG TERM GOAL #4   Title firmness of the abdomen decreased >/= 50% due to improved mobility of the tissue and reduction of pain    Time 12    Period Weeks    Status New    Target Date 03/29/22                   Plan - 02/01/22 1659     Clinical Impression Statement Patient is taking medication of SIBO. She continues to have the bloating and firness of her abdomen. She reports after last visit she felt better for 3 days. Therapist has instructed her husband on how to perfrom abdominal work to assist with the bloating. Patient has not had fecal leakage since the initial eval. She is learning how to contract her abdominals since they have been stretched out from the bloating. Patient will benefit from skilled therapy to improve pelvic floor coordinaiton and strength while reducing abdominal pain and bloating.    Personal Factors and Comorbidities Fitness;Comorbidity 3+    Comorbidities Tummy tuck; hemorroid removal; c-section    Examination-Activity Limitations Continence;Toileting    Stability/Clinical Decision Making Stable/Uncomplicated    Rehab Potential Excellent    PT Frequency 1x / week   every other week   PT Duration 12 weeks    PT Treatment/Interventions ADLs/Self Care Home Management;Biofeedback;Electrical Stimulation;Cryotherapy;Moist Heat;Therapeutic activities;Therapeutic exercise;Neuromuscular re-education;Patient/family education;Manual techniques;Manual lymph drainage;Taping    PT Next Visit Plan work on the abdomen and diaphragm to  improve tissue mobility, work on the intestines, advance abdominal  exercises  PT Home Exercise Plan Access Code: [facility]    Consulted and Agree with Plan of Care Patient             Patient will benefit from skilled therapeutic intervention in order to improve the following deficits and impairments:  Decreased coordination, Increased fascial restricitons, Decreased activity tolerance, Pain, Decreased strength  Visit Diagnosis: Muscle weakness (generalized)  Other lack of coordination     Problem List Patient Active Problem List   Diagnosis Date Noted   Tubular adenoma 01/29/2022   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   Gastric reflux 01/29/2022   Insomnia 09/22/2018   Severe recurrent major depression without psychotic features ([place]) 09/11/2018   Generalized anxiety disorder 09/11/2018   Seasonal allergies 05/22/2012    Earlie Counts, PT 02/01/22 5:03 PM  [facility] @ [place], [place], [address] Phone: [phone]   Fax:  [phone]  Name: Amy Mcintyre MRN: [id number] Date of Birth: March 08, 1966

## 2022-02-02 NOTE — Progress Notes (Signed)
Reviewed and agree with management plans. ? ?Tekesha Almgren L. Xoey Warmoth, MD, MPH  ?

## 2022-02-17 ENCOUNTER — Other Ambulatory Visit: Payer: Self-pay

## 2022-02-17 ENCOUNTER — Ambulatory Visit: Payer: PRIVATE HEALTH INSURANCE | Attending: Gastroenterology | Admitting: Physical Therapy

## 2022-02-17 ENCOUNTER — Encounter: Payer: Self-pay | Admitting: Physical Therapy

## 2022-02-17 DIAGNOSIS — R278 Other lack of coordination: Secondary | ICD-10-CM | POA: Diagnosis present

## 2022-02-17 DIAGNOSIS — M6281 Muscle weakness (generalized): Secondary | ICD-10-CM | POA: Insufficient documentation

## 2022-02-17 NOTE — Therapy (Signed)
[facility] ?[facility] @ [place] ?[place], [place], [address] ?Phone: [phone]   Fax:  [phone] ? ?Physical Therapy Treatment ? ?Patient Details  ?Name: Amy Mcintyre ?MRN: [id number] ?Date of Birth: 02-06-1966 ?Referring Provider (PT): Dr. Harl Bowie ? ? ?Encounter Date: 02/17/2022 ? ? PT End of Session - 02/17/22 1620   ? ? Visit Number 4   ? Date for PT Re-Evaluation 03/29/22   ? [place]   ? PT Start Time 1615   ? PT Stop Time 1655   ? PT Time Calculation (min) 40 min   ? Activity Tolerance Patient tolerated treatment well   ? Behavior During Therapy [facility] for tasks assessed/performed   ? ?  ?  ? ?  ? ? ?Past Medical History:  ?Diagnosis Date  ? Allergy   ? Anxiety   ? Cataracts, bilateral   ? Colitis   ? Depression   ? Eosinophilic esophagitis   ? Fatty liver   ? Gastritis   ? GERD (gastroesophageal reflux disease)   ? Hyperlipidemia   ? Thyroid disease   ? TMJ (dislocation of temporomandibular joint)   ? ? ?Past Surgical History:  ?Procedure Laterality Date  ? BREAST ENHANCEMENT SURGERY    ? CESAREAN SECTION    ? NASAL SINUS SURGERY    ? tummy tuck    ? WRIST SURGERY Left   ? ? ?There were no vitals filed for this visit. ? ? Subjective Assessment - 02/17/22 1621   ? ? Subjective My husband did the abdominal massage. I felt good after last visit. I finished the CIBO medication 1 week ago. I have had normal bowel movements. I am having a bowel movement daily and firm one.   ? Patient Stated Goals reduce leakage and improve anal tone   ? Currently in Pain? No/denies   ? ?  ?  ? ?  ? ? ? ? ? ? ? ? ? ? ? ? ? ? ? ? ? Pelvic Floor Special Questions - 02/17/22 0001   ? ? Skin Integrity other skin tags around the anus   this where fecal material can get stuck in  ? Pelvic Floor Internal Exam Patient confirms identification and approves PT to assess pelvic floor and treatment   ? Exam Type Rectal   ? Palpation patient was able to  push the therapist finger out of the anal canal   ? Strength good squeeze, good lift, able to hold agaisnt strong resistance   ? ?  ?  ? ?  ? ? ? ? [facility] Adult PT Treatment/Exercise - 02/17/22 0001   ? ?  ? Self-Care  ? Self-Care Other Self-Care Comments   ? Other Self-Care Comments  discussed with patient on anal wicks to use for the stool that is left after wiping and to use wet wipes to fully clean the anus instead of wiping many times. educated patient on using moisturizers around the anal area to reduce dryness and improve cleaning the area   ?  ? Therapeutic Activites   ? Therapeutic Activities Other Therapeutic Activities   ? Other Therapeutic Activities laying in sidely iwth bulging the abdomen then breathing out and pushing the therapist finger out and she was able to perform correctly   ?  ? Manual Therapy  ? Manual Therapy Soft tissue mobilization;Internal Pelvic Floor   ? Soft tissue mobilization manual work to the perineal body   ? Internal Pelvic  Floor manual work around the anococcygeal ligament and along the right iliococcygeal ligament   ? ?  ?  ? ?  ? ? ? ? ? ? ? ? ? ? PT Education - 02/17/22 1645   ? ? Education Details educated patient on anal wick, how to moisturize the anal area to reduce her dryness, wiping with wet wipes to clean around the skin tags around the anal area, reviewed toileting   ? Person(s) Educated Patient   ? Methods Explanation;Demonstration;Handout   ? Comprehension Verbalized understanding;Returned demonstration   ? ?  ?  ? ?  ? ? ? PT Short Term Goals - 02/17/22 1705   ? ?  ? PT SHORT TERM GOAL #1  ? Title independent with initial HEP for pelvic floor strengthening   ? Time 4   ? Period Weeks   ? Status Achieved   ?  ? PT SHORT TERM GOAL #2  ? Title educated on fiber to improve consistency of stool to reduce diarrhea   ? Time 4   ? Period Weeks   ? Status Deferred   ?  ? PT SHORT TERM GOAL #3  ? Title educated on abdominal massage to reduce bloating of the abdomen and  improve stool consistency   ? Time 4   ? Period Weeks   ? Status Achieved   ? ?  ?  ? ?  ? ? ? ? PT Long Term Goals - 01/04/22 1047   ? ?  ? PT LONG TERM GOAL #1  ? Title independent with advanced HEP for pelvic floor strength and core strength   ? Time 12   ? Period Weeks   ? Status New   ? Target Date 03/29/22   ?  ? PT LONG TERM GOAL #2  ? Title pelvic floor strength 3/5 with puborectalis coming forward to reduce stool leakage   ? Time 12   ? Period Weeks   ? Status New   ? Target Date 03/29/22   ?  ? PT LONG TERM GOAL #3  ? Title able to engage the lower abdomen to improve pelvic floor strength and toileting   ? Time 12   ? Period Weeks   ? Target Date 03/29/22   ?  ? PT LONG TERM GOAL #4  ? Title firmness of the abdomen decreased >/= 50% due to improved mobility of the tissue and reduction of pain   ? Time 12   ? Period Weeks   ? Status New   ? Target Date 03/29/22   ? ?  ?  ? ?  ? ? ? ? ? ? ? ? Plan - 02/17/22 1702   ? ? Clinical Impression Statement Patient is now having full bowel movements that are firmer daily. She has to wipe several times due to the increased number of skin tags and dryness of the tissue. She was educated on using moisturizers in the area and using a wet wipe. Pelvic floor strength has increased to 4/5. She was able to push the therapist finger out without difficulty. Patient will benefit from skilled therapy to improve pelvic floor coordinaiton and strength while reducing abdominal pain and bloating.   ? Personal Factors and Comorbidities Fitness;Comorbidity 3+   ? Comorbidities Tummy tuck; hemorroid removal; c-section   ? Examination-Activity Limitations Continence;Toileting   ? Stability/Clinical Decision Making Stable/Uncomplicated   ? Rehab Potential Excellent   ? PT Frequency 1x / week   every other week  ?  PT Duration 12 weeks   ? PT Treatment/Interventions ADLs/Self Care Home Management;Biofeedback;Electrical Stimulation;Cryotherapy;Moist Heat;Therapeutic activities;Therapeutic  exercise;Neuromuscular re-education;Patient/family education;Manual techniques;Manual lymph drainage;Taping   ? PT Next Visit Plan work on the abdomen and diaphragm to  improve tissue mobility, work on the intestines, advance abdominal exercises, teach her how to do the abdominal work; discharge; see if the moisturizers helped   ? PT Home Exercise Plan Access Code: [email or web address]   ? Consulted and Agree with Plan of Care Patient   ? ?  ?  ? ?  ? ? ?Patient will benefit from skilled therapeutic intervention in order to improve the following deficits and impairments:  Decreased coordination, Increased fascial restricitons, Decreased activity tolerance, Pain, Decreased strength ? ?Visit Diagnosis: ?Muscle weakness (generalized) ? ?Other lack of coordination ? ? ? ? ?Problem List ?Patient Active Problem List  ? Diagnosis Date Noted  ? Tubular adenoma 01/29/2022  ? 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  ? Gastric reflux 01/29/2022  ? Insomnia 09/22/2018  ? Severe recurrent major depression without psychotic features ([place]) 09/11/2018  ? Generalized anxiety disorder 09/11/2018  ? Seasonal allergies 05/22/2012  ? ? ?Earlie Counts, PT ?02/17/22 5:07 PM ? ? ?[facility] ?[facility] @ [place] ?[place], [place], [address] ?Phone: [phone]   Fax:  [phone] ? ?Name: Amy Mcintyre ?MRN: [id number] ?Date of Birth: [birth year] ? ? ? ?

## 2022-02-17 NOTE — Patient Instructions (Addendum)
Anal Wicks ?The use of anal wicks can help prevent the cycle of minor liquid stool leaking --> excessive wiping --> anal stimulation --> increased colonic motility --> looser stools --> greater chance of minor liquid stool leaking.  ?To use anal wicks:  ?Wipe 2-3 times with tissue paper after having a bowel movement.  ?Place a large cotton ball at the anus (low between the buttock cheeks). This cotton ball will soak up any minor liquid stool leaking and protect your clothes from getting dirty.  ?Change out the cotton ball whenever you go to the bathroom (for urine or bowel movements). ?Good clean love has wipes for going to the bathroom.  ? ?Moisturizers ?They are used in the vagina to hydrate the mucous membrane that make up the vaginal canal. ?Designed to keep a more normal acid balance (ph) ?Once placed in the vagina, it will last between two to three days.  ?Use 2-3 times per week at bedtime  ?Ingredients to avoid is glycerin and fragrance, can increase chance of infection ?Should not be used just before sex due to causing irritation ?Most are gels administered either in a tampon-shaped applicator or as a vaginal suppository. They are non-hormonal. ? ? ?Types of Moisturizers(internal use) ? ?Vitamin E vaginal suppositories- Whole foods, Amazon ?Moist Again ?Coconut oil- can break down condoms ?Julva- (Do no use if on Tamoxifen) amazon ?Yes moisturizer- amazon ?NeuEve Silk , NeuEve Silver for menopausal or over 65 (if have severe vaginal atrophy or cancer treatments use NeuEve Silk for  1 month than move to [employer])- [employer], [employer].com ?Olive and Bee intimate cream- www.oliveandbee.com.au ?Mae vaginal moisturizer- Amazon ?Aloe ? ? ? ?Creams to use externally on the Vulva area ?Desert [employer] Lee (good for for cancer patients that had radiation to the area)- [place] or [employer].FlyingBasics.com.br ?V-magic cream - amazon ?Julva-amazon ?Vital "V [place] salve ( help moisturize and help with thinning vulvar area,  does have Beeswax ?[place] ?Desert [employer] ?Cleo by Damiva labial moisturizer ([place],  ?Coconut or olive oil ?aloe ? ? ?Things to avoid in the vaginal area ?Do not use things to irritate the vulvar area ?No lotions just specialized creams for the vulva area- Neogyn, V-magic, No soaps; can use Aveeno or Calendula cleanser if needed. Must be gentle ?No deodorants ?No douches ?Good to sleep without underwear to let the vaginal area to air out ?No scrubbing: spread the lips to let warm water rinse over labias and pat dry  ?[facility] ?[address], [address] ?[place], [place] [address] ?Phone # [phone] ?Fax [phone] ? ?

## 2022-02-19 ENCOUNTER — Encounter: Payer: Self-pay | Admitting: Physical Therapy

## 2022-03-01 ENCOUNTER — Ambulatory Visit: Payer: PRIVATE HEALTH INSURANCE | Admitting: Physical Therapy

## 2022-03-01 ENCOUNTER — Other Ambulatory Visit: Payer: Self-pay

## 2022-03-01 ENCOUNTER — Encounter: Payer: Self-pay | Admitting: Physical Therapy

## 2022-03-01 ENCOUNTER — Ambulatory Visit (INDEPENDENT_AMBULATORY_CARE_PROVIDER_SITE_OTHER): Payer: PRIVATE HEALTH INSURANCE | Admitting: Gastroenterology

## 2022-03-01 ENCOUNTER — Encounter: Payer: Self-pay | Admitting: Gastroenterology

## 2022-03-01 VITALS — BP 126/80 | HR 95 | Ht 58.25 in | Wt 159.8 lb

## 2022-03-01 DIAGNOSIS — R140 Abdominal distension (gaseous): Secondary | ICD-10-CM

## 2022-03-01 DIAGNOSIS — K6389 Other specified diseases of intestine: Secondary | ICD-10-CM

## 2022-03-01 DIAGNOSIS — K638219 Small intestinal bacterial overgrowth, unspecified: Secondary | ICD-10-CM

## 2022-03-01 DIAGNOSIS — M6281 Muscle weakness (generalized): Secondary | ICD-10-CM | POA: Diagnosis not present

## 2022-03-01 DIAGNOSIS — K529 Noninfective gastroenteritis and colitis, unspecified: Secondary | ICD-10-CM | POA: Diagnosis not present

## 2022-03-01 DIAGNOSIS — R278 Other lack of coordination: Secondary | ICD-10-CM

## 2022-03-01 DIAGNOSIS — K760 Fatty (change of) liver, not elsewhere classified: Secondary | ICD-10-CM | POA: Diagnosis not present

## 2022-03-01 MED ORDER — RIFAXIMIN 550 MG PO TABS: 550.0000 mg | ORAL_TABLET | Freq: Three times a day (TID) | ORAL | 0 refills | Status: DC

## 2022-03-01 NOTE — Patient Instructions (Addendum)
It was my pleasure to provide care to you today. Based on our discussion, I am providing you with my recommendations below: ? ?RECOMMENDATION(S):  ? ?Try plant based food varieties ? ?Try lowFODMAP diet ? ?Continue Benefiber, drink at least 1.5-2 liters of water ? ?Daily stool bulking agent with psyllium or methylcellulose recommended ? ?PRESCRIPTION MEDICATION(S):  ? ?We have sent the following medication(s) to your pharmacy: ? ?Xifaxan 550 mg three times daily for 14 days. ? ?NOTE: If your medication(s) requires a PRIOR AUTHORIZATION, we will receive notification from your pharmacy. Once received, the process to submit for approval may take up to 7-10 business days. You will be contacted about any denials we have received from your insurance company as well as alternatives recommended by your provider. ? ?REFERRAL: ? ?A referral, your demographics, a copy of your insurance card and your records will be sent to [facility]. You will receive a call from their office regarding the date, time and location of your appointment. ? ? ?FOLLOW UP: ? ?I would like for you to follow up with me via Mychart after completing antibiotic.  ? ?BMI: ? ? ?If you are age 27 or younger, your body mass index should be between 19-25. Your Body mass index is 33.11 kg/m?[place] If this is out of the aformentioned range listed, please consider follow up with your Primary Care Provider.  ? ?MY CHART: ? ?The [facility] [place] [facility] providers would like to encourage you to use [facility] to communicate with providers for non-urgent requests or questions.  Due to long hold times on the telephone, sending your provider a message by [facility] may be a faster and more efficient way to get a response.  Please allow 48 business hours for a response.  Please remember that this is for non-urgent requests.  ? ?Thank you for trusting me with your gastrointestinal care!   ? ?Th[place]ton [place], MD, MPH ? ?

## 2022-03-01 NOTE — Therapy (Signed)
?[facility] ?[place], [place], [address] ?Phone: [phone]   Fax:  [phone] ? ?Physical Therapy Treatment ? ?Patient Details  ?Name: Amy Mcintyre ?MRN: [id number] ?Date of Birth: Jul 16, 1966 ?Referring Provider (PT): Dr. Harl Bowie ? ? ?Encounter Date: 03/01/2022 ? ? PT End of Session - 03/01/22 0803   ? ? Visit Number 5   ? Date for PT Re-Evaluation 03/29/22   ? [place]   ? PT Start Time 0800   ? PT Stop Time 0840   ? PT Time Calculation (min) 40 min   ? Activity Tolerance Patient tolerated treatment well   ? Behavior During Therapy [facility] for tasks assessed/performed   ? ?  ?  ? ?  ? ? ?Past Medical History:  ?Diagnosis Date  ? Allergy   ? Anxiety   ? Cataracts, bilateral   ? Colitis   ? Depression   ? Eosinophilic esophagitis   ? Fatty liver   ? Gastritis   ? GERD (gastroesophageal reflux disease)   ? Hyperlipidemia   ? Thyroid disease   ? TMJ (dislocation of temporomandibular joint)   ? ? ?Past Surgical History:  ?Procedure Laterality Date  ? BREAST ENHANCEMENT SURGERY    ? CESAREAN SECTION    ? NASAL SINUS SURGERY    ? tummy tuck    ? WRIST SURGERY Left   ? ? ?There were no vitals filed for this visit. ? ? Subjective Assessment - 03/01/22 0804   ? ? Subjective I have been doing pretty good.I am having some diarrhea. I am walking and joined the [facility]. the moisturizers are helping.   ? Patient Stated Goals reduce leakage and improve anal tone   ? Currently in Pain? No/denies   ? Multiple Pain Sites No   ? ?  ?  ? ?  ? ? ? ? ? [facility] PT Assessment - 03/01/22 0001   ? ?  ? Assessment  ? 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 Referring Provider (PT) Dr. Harl Bowie   ? Prior Therapy none   ?  ? Precautions  ? Precautions None   ?  ? Restrictions  ? Weight Bearing Restrictions No   ?  ? Cognition  ? Overall Cognitive Status Within Functional Limits for tasks assessed   ?  ? Observation/Other  Assessments  ? Observations Abdominal circumference at the umbilicus is 42 inches prior to treatment and after treatemtn went doen to 41.75 inches   ?  ? Posture/Postural Control  ? Posture/Postural Control No significant limitations   ?  ? Strength  ? Right Hip ABduction 4/5   ? Left Hip ABduction 4/5   ? ?  ?  ? ?  ? ? ? ? ? ? ? ? ? ? ? ? ? Pelvic Floor Special Questions - 03/01/22 0001   ? ? Number of C-Sections 1   1 miscarriage  ? Urinary Leakage No   ? Fecal incontinence No   ? Palpation patient was able to push the therapist finger out of the anal canal   ? Strength good squeeze, good lift, able to hold agaisnt strong resistance   ? ?  ?  ? ?  ? ? ? ? [facility] PT Treatment/Exercise - 03/01/22 0001   ? ?  ? Lumbar Exercises: Supine  ? Ab Set 10 reps;5 seconds   ? AB Set Limitations with yoga block between knees and tactile  cues to lift abdomen   ? Clam 10 reps   right, left  ? Clam Limitations abdominal contraction   ? Heel Slides 10 reps   right, left  ? Heel Slides Limitations abdominal contraction   ?  ? Manual Therapy  ? Manual Therapy Soft tissue mobilization;Myofascial release   ? Manual therapy comments educated patient on how to perform manual work to abdomen   ? Soft tissue mobilization circular massage to the abdomen , then the I Love You massage, making triangle on abdomen, vibrations in circles   ? Myofascial Release releaes of the lower abdomen   ? ?  ?  ? ?  ? ? ? ? ? ? ? ? ? ? ? ? PT Short Term Goals - 03/01/22 0843   ? ?  ? PT SHORT TERM GOAL #1  ? Title independent with initial HEP for pelvic floor strengthening   ? Time 4   ? Period Weeks   ? Status Achieved   ? Target Date 02/01/22   ?  ? PT SHORT TERM GOAL #2  ? Title educated on fiber to improve consistency of stool to reduce diarrhea   ? Time 4   ? Period Weeks   ? Status Deferred   ? Target Date 02/01/22   ?  ? PT SHORT TERM GOAL #3  ? Title educated on abdominal massage to reduce bloating of the abdomen and improve stool consistency    ? Time 4   ? Period Weeks   ? Status Achieved   ? Target Date 02/01/22   ? ?  ?  ? ?  ? ? ? ? PT Long Term Goals - 03/01/22 0843   ? ?  ? PT LONG TERM GOAL #1  ? Title independent with advanced HEP for pelvic floor strength and core strength   ? Time 12   ? Period Weeks   ? Status Achieved   ? Target Date 03/29/22   ?  ? PT LONG TERM GOAL #2  ? Title pelvic floor strength 3/5 with puborectalis coming forward to reduce stool leakage   ? Time 12   ? Period Weeks   ? Status Achieved   ? Target Date 03/29/22   ?  ? PT LONG TERM GOAL #3  ? Title able to engage the lower abdomen to improve pelvic floor strength and toileting   ? Time 12   ? Period Weeks   ? Status Achieved   ? Target Date 03/29/22   ?  ? PT LONG TERM GOAL #4  ? Title firmness of the abdomen decreased >/= 50% due to improved mobility of the tissue and reduction of pain   ? Time 12   ? Period Weeks   ? Status Achieved   ? ?  ?  ? ?  ? ? ? ? ? ? ? ? Plan - 03/01/22 0840   ? ? Clinical Impression Statement Patient is having some diarrhea. Her pelvic floor strength is 4/5 and she is able to push the therapist finger out of the rectum. She will wipe several time due to stool being in the skin tags. Patient feels the vaginal moisturizers are helping. Patient understands how to perfrom manual work on abdomen to reduce the bloating. Her abdomial circumference went from 43 inches to 41.75 inches. Patient is independent with her HEP.   ? Personal Factors and Comorbidities Fitness;Comorbidity 3+   ? Comorbidities Tummy tuck; hemorroid removal; c-section   ? Examination-Activity  Limitations Continence;Toileting   ? Stability/Clinical Decision Making Stable/Uncomplicated   ? Rehab Potential Excellent   ? PT Treatment/Interventions ADLs/Self Care Home Management;Biofeedback;Electrical Stimulation;Cryotherapy;Moist Heat;Therapeutic activities;Therapeutic exercise;Neuromuscular re-education;Patient/family education;Manual techniques;Manual lymph drainage;Taping   ? PT  Next Visit Plan discharge to HEP   ? PT Home Exercise Plan Access Code: [email or web address]   ? Consulted and Agree with Plan of Care Patient   ? ?  ?  ? ?  ? ? ?Patient will benefit from skilled therapeutic intervention in order to improve the following deficits and impairments:  Decreased coordination, Increased fascial restricitons, Decreased activity tolerance, Pain, Decreased strength ? ?Visit Diagnosis: ?Muscle weakness (generalized) ? ?Other lack of coordination ? ? ? ? ?Problem List ?Patient Active Problem List  ? Diagnosis Date Noted  ? Tubular adenoma 01/29/2022  ? 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  ? Gastric reflux 01/29/2022  ? Insomnia 09/22/2018  ? Severe recurrent major depression without psychotic features ([place]) 09/11/2018  ? Generalized anxiety disorder 09/11/2018  ? Seasonal allergies 05/22/2012  ? ? ?Earlie Counts, PT ?03/01/22 8:44 AM ? ?[facility] ?[place], [place], [address] ?Phone: [phone]   Fax:  [phone] ? ?Name: Amy Mcintyre ?MRN: [id number] ?Date of Birth: July 27, 1966 ? ?PHYSICAL THERAPY DISCHARGE SUMMARY ? ?Visits from Start of Care: 5 ? ?Current functional level related to goals / functional outcomes: ?See above.  ?  ?Remaining deficits: ?See above.  ?  ?Education / Equipment: ?HEP  ? ?Patient agrees to discharge. Patient goals were met. Patient is being discharged due to meeting the stated rehab goals. Thank you for the referral. Earlie Counts, PT ?03/01/22 8:44 AM ? ? ? ?

## 2022-03-01 NOTE — Progress Notes (Addendum)
? ?Referring Provider: Tobe Sos, MD ?Primary Care Physician:  Tobe Sos, MD ? ? ?Chief complaint:  Diarrhea and altered abdominal girth ? ? ?IMPRESSION:  ?Suspected SIBO despite prior negative testing ?Elevated transaminases due to fatty liver ?Diarrhea- IBS ?   - Chronic diarrhea with intermittent bloating  ?   - initially thought to be related to hypothyroidism  ?   - testing for celiac negative, giardia ?   - normal fecal calprotectin ?   - negative colon and duodenal biopsies ?   - not improved by probiotics ?   - breath test negative for SIBO by hydrogen or methane ?   - allergy testing 2020: cow's milk, string beans ?Bleeding internal hemorrhoids  ?   - band ligation x 3 with Dr. Silverio Decamp ?   - continue to maximize medical therapy ?Pelvic Floor dysfunction with decreased sphincter tone ?   - Completed pelvic floor PT ?Abnormal imaging of the liver ?   - CT abd/pelvis with contrast 04/18/2019: 2.7 cm focal lesion was identified in segment 8 ?      -  left parametrial vein which may be seen with pelvic venous congestion syndrome.   ?   - MRI  04/24/2019:  liver focal nodular hyperplasia. A bone island was also seen. ?   - MRI with Eovist 08/07/2019: stable 1.6 x 2.2 cm enhancing lesion c/w hepatic adenoma ?   - CT with and without contrast 01/08/22: stable findings ?NASH ?   - hepatic steatosis on ultrasound 12/13/18 with ALT 29 ?   - 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 ?   - testing for viral hepatitis and autoimmune hepatitis negative ?   - Fibrotest: fibrosis stage of 1, necroinflammatory grade of A0-A1 ?   - NASH FibroSURE 09/22/21: showed S3 (severe steatosis), F0 (no fibrosis), N2- NASH ?   - provided reassurance that recent CT scan findings suggest prior known results ?   - consider repeat staging in 12 months ?Reflux (biopsy proven) on EGD 10/19/18 ?   - resumed PPI when she developed dysphonia ?Tubular adenoma 2019 ?   - No adenomas on colonoscopy 2023 ?   - surveillance  colonoscopy recommended 2028 given the family history ?Family history of colon polyps (father in his 64s)  ? ?PLAN: ?- Xifaxan 550 mg TID x 14 days, if cost prohibitive may substitute with doxycycline 100 mg BID x 14 days ?- Discussed plant based food varieties with diet variety recommended ?- Discussed lowFODMAP diet ?- Continue Benefiber, drink at least 1.5-2 liters of water ?- Daily stool bulking agent with psyllium or methylcellulose recommended ?- Consider referral to [facility] to discuss weight loss ?- Referral to PCP with [facility] to establish care ?- MyChart Update after completing antibiotics with symptom update ?- Return to the office in 2-3 months, earlier if necessary ? ? ?HPI: Amy Mcintyre is a 56 y.o. female who returns in follow-up. Prior office visits have involved evaluation of abdominal pain, bloating, and chronic diarrhea attributed to functional diarrhea, and bleeding hemorrhoids treated by Dr. Silverio Decamp with banding.  Evaluation included negative testing for celiac, Giardia, and normal fecal calprotectin, normal colon and duodenal biopsies, and a negative SIBO breath test.  There was temporal association in improved GI symptoms with improved regulation of thyroid.  During that evaluation she was found to have fatty liver and a 2.7 cm focal lesion in segment 8 of the liver thought to be a [place]  on prior imaging and later hepatic adenoma on subsequent imaging.  ? ?When I last saw her for urgent evaluation 01/05/22 she was concerned about increasing abdominal girth with associated constant, uncomfortable bloating and persistent diarrhea. She felt her abdomen was harder than it should be and that this was a marked change from her baseline.  ? ?Dr. Silverio Decamp recommended pelvic floor physical therapy for pelvic floor dysfunction and to improve anal sphincter tone. She completed PT and feels it was helpful.    ? ?She also had stool studies. C diff, crypto, giardia were all  negative 12/28/21. ? ?CT abd/pelvis with and without contrast 01/08/22 showed no acute findings, submucosal fatty deposition in the terminal ileum and long segment of the descending colon, diverticulosis, severe hepatic steatosis and hepatomegaly, and a stable hepatic lesion thought to be a small hepatic adenoma. ? ?Colonoscopy 01/14/22 showed internal hemorrhoids, diverticulosis in the sigmoid colon and in the descending colon, mild colitis from 26 to 36 cm proximal to the anus. Unclear if this is clinically significant or related to bowel prep, and one 2 mm polyp at the appendiceal orifice, removed with a cold snare. All biopsies were essentially normal.  ? ?She reports today in follow-up after completing 14 days of empiric treatment with docycycline. No change in symptoms while taking antibiotics, but, noted improvement by the end of treatment. Diarrhea than returned. Overall feeling better. Energy has improved. [facility] even [facility] [employer] to start working [facility].  ? ?Today we also re-reviewed her NASH FibroSURE from 09/22/21 that showed S3 (marked or severe steatosis), F0 (no fibrosis), N2- NASH.  ? ?Endoscopic evaluation:  ?- Colonoscopy 10/19/2018 showed left-sided diverticulosis, a small tubular adenoma in the descending colon, and nonbleeding internal hemorrhoids.  Random colon biopsies were normal. ?- Colonoscopy 01/14/22 showed internal hemorrhoids, diverticulosis in the sigmoid colon and in the descending colon, mild colitis from 26 to 36 cm proximal to the anus. Unclear if this is clinically significant or related to bowel prep, and one 2 mm polyp at the appendiceal orifice, removed with a cold snare. All biopsies were essentially normal.  ? ? ? ? ?Past Medical History:  ?Diagnosis Date  ? Allergy   ? Anxiety   ? Cataracts, bilateral   ? Colitis   ? Depression   ? Eosinophilic esophagitis   ? Fatty liver   ? Gastritis   ? GERD (gastroesophageal reflux disease)   ? Hyperlipidemia   ? Thyroid disease   ? TMJ (dislocation  of temporomandibular joint)   ? ? ?Past Surgical History:  ?Procedure Laterality Date  ? BREAST ENHANCEMENT SURGERY    ? CESAREAN SECTION    ? NASAL SINUS SURGERY    ? tummy tuck    ? WRIST SURGERY Left   ? ? ? ?Current Outpatient Medications  ?Medication Sig Dispense Refill  ? Ascorbic Acid (VITAMIN C) 100 MG tablet Take 100 mg by mouth daily.    ? atorvastatin (LIPITOR) 40 MG tablet Take 40 mg by mouth at bedtime.    ? baclofen (LIORESAL) 20 MG tablet Take 20 mg by mouth 3 (three) times daily as needed.    ? brexpiprazole (REXULTI) 1 MG TABS tablet Take 1 tablet (1 mg total) by mouth daily. 90 tablet 1  ? cetirizine (ZYRTEC) 10 MG tablet Take 1 tablet by mouth daily at 2 PM.    ? cholecalciferol (VITAMIN D3) 25 MCG (1000 UNIT) tablet Take 1,000 Units by mouth daily.    ? clonazePAM (KLONOPIN) 1 MG  tablet TAKE 1/2 TO 1 (ONE-HALF TO ONE) TABLET BY MOUTH THREE TIMES DAILY AS NEEDED FOR PANIC ATTACK OR INSOMNIA 90 tablet 5  ? gabapentin (NEURONTIN) 300 MG capsule Take 300 mg by mouth 3 (three) times daily.    ? hydrocortisone (ANUSOL-HC) 25 MG suppository Place 25 mg rectally as needed for hemorrhoids or anal itching.    ? levothyroxine (SYNTHROID, LEVOTHROID) 75 MCG tablet Take 75 mcg by mouth daily.     ? metFORMIN (GLUCOPHAGE) 500 MG tablet Take 1 tablet daily with food for one week, then increase to 1 tablet BID with food 180 tablet 0  ? nystatin cream (MYCOSTATIN) Apply 1 application topically as needed.    ? pantoprazole (PROTONIX) 40 MG tablet Take 1 tablet by mouth 2 (two) times daily.    ? sertraline (ZOLOFT) 100 MG tablet Take 2 tablets (200 mg total) by mouth daily. 180 tablet 1  ? tacrolimus (PROTOPIC) 0.03 % ointment Apply topically 2 (two) times daily.    ? ?No current facility-administered medications for this visit.  ? ? ?Allergies as of 03/01/2022 - Review Complete 03/01/2022  ?Allergen Reaction Noted  ? Ambien [zolpidem tartrate]  09/22/2018  ? Erythromycin Swelling 09/22/2018  ? Penicillins Swelling  09/22/2018  ? ? ?Family History  ?Problem Relation Age of Onset  ? Anxiety disorder Mother   ? Panic disorder Mother   ? OCD Mother   ? Depression Mother   ? Anxiety disorder Father   ? Depression Father   ? Colon p

## 2022-03-02 ENCOUNTER — Telehealth: Payer: Self-pay | Admitting: *Deleted

## 2022-03-02 NOTE — Telephone Encounter (Signed)
PA submitted via for Covermymeds for Xifaxan. ?

## 2022-03-11 NOTE — Telephone Encounter (Signed)
Xifaxan approved

## 2022-03-15 ENCOUNTER — Encounter: Payer: PRIVATE HEALTH INSURANCE | Admitting: Physical Therapy

## 2022-03-19 ENCOUNTER — Ambulatory Visit: Payer: PRIVATE HEALTH INSURANCE | Admitting: Physical Therapy

## 2022-04-01 ENCOUNTER — Encounter: Payer: Self-pay | Admitting: Gastroenterology

## 2022-04-02 ENCOUNTER — Other Ambulatory Visit: Payer: Self-pay

## 2022-04-02 DIAGNOSIS — K529 Noninfective gastroenteritis and colitis, unspecified: Secondary | ICD-10-CM

## 2022-04-02 MED ORDER — VIBERZI 100 MG PO TABS: 100.0000 mg | ORAL_TABLET | Freq: Two times a day (BID) | ORAL | 0 refills | Status: DC

## 2022-04-06 ENCOUNTER — Encounter: Payer: Self-pay | Admitting: Family Medicine

## 2022-04-09 NOTE — Telephone Encounter (Signed)
APPROVAL ? ?Medication: Viberzi 100 mg ?Insurance Company: [employer] ?PA response: Approved ?Approval dates: 04/07/22-04/09/23 ? ? ?

## 2022-04-09 NOTE — Telephone Encounter (Signed)
PRIOR AUTHORIZATION ? ?PA initiation date: 04/07/22  ?Medication: Viberzi 100 mg ?Insurance Company: [employer] ?Submission completed electronically through Cover My Meds: Yes ? ?Will await insurance response re: approval/denial. ? ?

## 2022-04-13 NOTE — Telephone Encounter (Signed)
APPROVAL ? ?Medication: Xifaxan 550 mg ?Insurance Company: [employer] ?PA response: Approved ?Approval dates: 04/12/22 ? ? ?

## 2022-04-20 NOTE — Telephone Encounter (Signed)
Can patient take Viberizi and Xifaxan at the same time? Please advise. ?

## 2022-04-21 ENCOUNTER — Other Ambulatory Visit: Payer: Self-pay | Admitting: Gastroenterology

## 2022-04-21 ENCOUNTER — Encounter: Payer: Self-pay | Admitting: Gastroenterology

## 2022-04-22 ENCOUNTER — Telehealth: Payer: Self-pay | Admitting: Pharmacy Technician

## 2022-04-22 ENCOUNTER — Other Ambulatory Visit (HOSPITAL_COMMUNITY): Payer: Self-pay

## 2022-04-22 MED ORDER — RIFAXIMIN 550 MG PO TABS: 550.0000 mg | ORAL_TABLET | Freq: Three times a day (TID) | ORAL | 0 refills | Status: DC

## 2022-04-22 NOTE — Telephone Encounter (Signed)
New script sent to patient's pharmacy.

## 2022-04-22 NOTE — Telephone Encounter (Signed)
Patient Advocate Encounter  Received notification from [facility] that prior authorization for XIFAXAN '550MG'$  is required.   PA submitted on 5.18.23 Key [id number] Status is pending   [facility] will continue to follow  Luciano Cutter, CPhT Patient Advocate Phone: [phone]

## 2022-04-22 NOTE — Telephone Encounter (Signed)
Received notification from [facility] regarding a prior authorization for XIFAXAN '550MG'$ . Authorization has been APPROVED from 5.18.23 to 5.25.23.   Per test claim, copay for 14 days supply is $0

## 2022-04-22 NOTE — Telephone Encounter (Signed)
Please advise. Should patient take Xifaxan and Viberzi at the same time?

## 2022-04-27 ENCOUNTER — Ambulatory Visit: Payer: PRIVATE HEALTH INSURANCE | Admitting: Psychiatry

## 2022-05-05 ENCOUNTER — Ambulatory Visit (INDEPENDENT_AMBULATORY_CARE_PROVIDER_SITE_OTHER): Payer: PRIVATE HEALTH INSURANCE

## 2022-05-05 ENCOUNTER — Encounter: Payer: Self-pay | Admitting: Family Medicine

## 2022-05-05 ENCOUNTER — Ambulatory Visit (INDEPENDENT_AMBULATORY_CARE_PROVIDER_SITE_OTHER): Payer: PRIVATE HEALTH INSURANCE | Admitting: Family Medicine

## 2022-05-05 VITALS — BP 112/72 | HR 100 | Temp 97.6°F | Ht 58.25 in | Wt 157.0 lb

## 2022-05-05 DIAGNOSIS — K7581 Nonalcoholic steatohepatitis (NASH): Secondary | ICD-10-CM | POA: Insufficient documentation

## 2022-05-05 DIAGNOSIS — M26609 Unspecified temporomandibular joint disorder, unspecified side: Secondary | ICD-10-CM

## 2022-05-05 DIAGNOSIS — G8929 Other chronic pain: Secondary | ICD-10-CM | POA: Insufficient documentation

## 2022-05-05 DIAGNOSIS — E8881 Metabolic syndrome: Secondary | ICD-10-CM | POA: Insufficient documentation

## 2022-05-05 DIAGNOSIS — E559 Vitamin D deficiency, unspecified: Secondary | ICD-10-CM | POA: Diagnosis not present

## 2022-05-05 DIAGNOSIS — Z6831 Body mass index (BMI) 31.0-31.9, adult: Secondary | ICD-10-CM | POA: Insufficient documentation

## 2022-05-05 DIAGNOSIS — R202 Paresthesia of skin: Secondary | ICD-10-CM

## 2022-05-05 DIAGNOSIS — R002 Palpitations: Secondary | ICD-10-CM | POA: Insufficient documentation

## 2022-05-05 DIAGNOSIS — R748 Abnormal levels of other serum enzymes: Secondary | ICD-10-CM | POA: Diagnosis not present

## 2022-05-05 DIAGNOSIS — R7303 Prediabetes: Secondary | ICD-10-CM | POA: Diagnosis not present

## 2022-05-05 DIAGNOSIS — M5442 Lumbago with sciatica, left side: Secondary | ICD-10-CM

## 2022-05-05 DIAGNOSIS — M130 Polyarthritis, unspecified: Secondary | ICD-10-CM | POA: Insufficient documentation

## 2022-05-05 DIAGNOSIS — N951 Menopausal and female climacteric states: Secondary | ICD-10-CM | POA: Insufficient documentation

## 2022-05-05 DIAGNOSIS — R5383 Other fatigue: Secondary | ICD-10-CM | POA: Insufficient documentation

## 2022-05-05 DIAGNOSIS — Z79899 Other long term (current) drug therapy: Secondary | ICD-10-CM | POA: Insufficient documentation

## 2022-05-05 DIAGNOSIS — T452X1A Poisoning by vitamins, accidental (unintentional), initial encounter: Secondary | ICD-10-CM | POA: Insufficient documentation

## 2022-05-05 DIAGNOSIS — K760 Fatty (change of) liver, not elsewhere classified: Secondary | ICD-10-CM

## 2022-05-05 DIAGNOSIS — E039 Hypothyroidism, unspecified: Secondary | ICD-10-CM

## 2022-05-05 DIAGNOSIS — F411 Generalized anxiety disorder: Secondary | ICD-10-CM

## 2022-05-05 DIAGNOSIS — K529 Noninfective gastroenteritis and colitis, unspecified: Secondary | ICD-10-CM | POA: Insufficient documentation

## 2022-05-05 DIAGNOSIS — R200 Anesthesia of skin: Secondary | ICD-10-CM

## 2022-05-05 DIAGNOSIS — L659 Nonscarring hair loss, unspecified: Secondary | ICD-10-CM | POA: Insufficient documentation

## 2022-05-05 DIAGNOSIS — M545 Low back pain, unspecified: Secondary | ICD-10-CM | POA: Insufficient documentation

## 2022-05-05 DIAGNOSIS — M7500 Adhesive capsulitis of unspecified shoulder: Secondary | ICD-10-CM | POA: Insufficient documentation

## 2022-05-05 DIAGNOSIS — G5601 Carpal tunnel syndrome, right upper limb: Secondary | ICD-10-CM | POA: Insufficient documentation

## 2022-05-05 DIAGNOSIS — R197 Diarrhea, unspecified: Secondary | ICD-10-CM | POA: Insufficient documentation

## 2022-05-05 DIAGNOSIS — J309 Allergic rhinitis, unspecified: Secondary | ICD-10-CM | POA: Insufficient documentation

## 2022-05-05 DIAGNOSIS — F329 Major depressive disorder, single episode, unspecified: Secondary | ICD-10-CM | POA: Insufficient documentation

## 2022-05-05 DIAGNOSIS — R21 Rash and other nonspecific skin eruption: Secondary | ICD-10-CM | POA: Insufficient documentation

## 2022-05-05 LAB — CBC WITH DIFFERENTIAL/PLATELET
Basophils Absolute: 0 10*3/uL (ref 0.0–0.1)
Basophils Relative: 0.4 % (ref 0.0–3.0)
Eosinophils Absolute: 0.1 10*3/uL (ref 0.0–0.7)
Eosinophils Relative: 1.4 % (ref 0.0–5.0)
HCT: 39.6 % (ref 36.0–46.0)
Hemoglobin: 13.2 g/dL (ref 12.0–15.0)
Lymphocytes Relative: 35.9 % (ref 12.0–46.0)
Lymphs Abs: 1.9 10*3/uL (ref 0.7–4.0)
MCHC: 33.4 g/dL (ref 30.0–36.0)
MCV: 90.9 fl (ref 78.0–100.0)
Monocytes Absolute: 0.4 10*3/uL (ref 0.1–1.0)
Monocytes Relative: 7.8 % (ref 3.0–12.0)
Neutro Abs: 2.9 10*3/uL (ref 1.4–7.7)
Neutrophils Relative %: 54.5 % (ref 43.0–77.0)
Platelets: 212 10*3/uL (ref 150.0–400.0)
RBC: 4.36 Mil/uL (ref 3.87–5.11)
RDW: 14.2 % (ref 11.5–15.5)
WBC: 5.4 10*3/uL (ref 4.0–10.5)

## 2022-05-05 LAB — HEMOGLOBIN A1C: Hgb A1c MFr Bld: 6.2 % (ref 4.6–6.5)

## 2022-05-05 LAB — COMPREHENSIVE METABOLIC PANEL
ALT: 75 U/L — ABNORMAL HIGH (ref 0–35)
AST: 48 U/L — ABNORMAL HIGH (ref 0–37)
Albumin: 4.6 g/dL (ref 3.5–5.2)
Alkaline Phosphatase: 88 U/L (ref 39–117)
BUN: 11 mg/dL (ref 6–23)
CO2: 24 mEq/L (ref 19–32)
Calcium: 10 mg/dL (ref 8.4–10.5)
Chloride: 103 mEq/L (ref 96–112)
Creatinine, Ser: 0.76 mg/dL (ref 0.40–1.20)
GFR: 87.57 mL/min (ref >60.00)
Glucose, Bld: 113 mg/dL — ABNORMAL HIGH (ref 70–99)
Potassium: 4 mEq/L (ref 3.5–5.1)
Sodium: 139 mEq/L (ref 135–145)
Total Bilirubin: 0.3 mg/dL (ref 0.2–1.2)
Total Protein: 7.4 g/dL (ref 6.0–8.3)

## 2022-05-05 LAB — VITAMIN B12: Vitamin B-12: 509 pg/mL (ref 211–911)

## 2022-05-05 LAB — VITAMIN D 25 HYDROXY (VIT D DEFICIENCY, FRACTURES): VITD: 65.1 ng/mL (ref 30.00–100.00)

## 2022-05-05 LAB — TSH: TSH: 1.21 u[IU]/mL (ref 0.35–5.50)

## 2022-05-05 NOTE — Patient Instructions (Signed)
Obgyn Offices:   [facility] [address] [place], [place] [address] [phone]  [facility] Address: [address] [place], [place] [address] Phone: [phone]  [facility] [address] [place], [place] [address] Phone: [phone]   [facility] [address] [place], [place] [address] Phone: [phone] 

## 2022-05-05 NOTE — Progress Notes (Unsigned)
New Patient Office Visit  Subjective    Patient ID: Amy Mcintyre, female    DOB: Sep 14, 1966  Age: 56 y.o. MRN: [id number]  CC:  Chief Complaint  Patient presents with   Establish Care    1- 6 weeks ago pt fell on something at work, did not fall but left glute consistently aches which radiates down her leg  2- right hand fingertips feel numb, at night whole hand gets tingly. Notes it is only the right hand  3- has really bad diarrhea, has been going on for a year now but notes this has stopped since she stopped taking her metformin since her procedure last Wednesday.     HPI Leonardo Plaia presents to establish care   Other providers:  [facility] for GAD, depression and insomnia.  GI- Dr. Tarri Glenn for chronic diarrhea  Eye doctor Dentist  Dermatologist - Dr. Quentin Cornwall in [place].  OB/GYN in [place] of numbness and tingling in right hand for several months.  At night her whole hand is numb. Thumb is numb all the time. Finger tips are numb.  Hx of carpal tunnel and used to wear a brace.   Hypothyroidism- diagnosed 3-4 years ago.  She was seeing an endocrinologist   Gabapentin and baclofen for TMJ Does not think baclofen helps.    Prediabetes-  Hgb A1c 6.2% in 01/2022 She was on metformin until one week ago. States her diarrhea stopped.   Vitamin D, C, B  supplements. Multivitamin.   Divorced. 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     Outpatient Encounter Medications as of 05/05/2022  Medication Sig   Ascorbic Acid (VITAMIN C) 100 MG tablet Take 100 mg by mouth daily.   atorvastatin (LIPITOR) 40 MG tablet Take 40 mg by mouth at bedtime.   baclofen (LIORESAL) 20 MG tablet Take 20 mg by mouth 3 (three) times daily as needed.   brexpiprazole (REXULTI) 1 MG TABS tablet Take by mouth daily. At bedtime   cholecalciferol (VITAMIN D3) 25 MCG (1000 UNIT) tablet Take 1,000 Units by  mouth daily.   clonazePAM (KLONOPIN) 1 MG tablet TAKE 1/2 TO 1 (ONE-HALF TO ONE) TABLET BY MOUTH THREE TIMES DAILY AS NEEDED FOR PANIC ATTACK OR INSOMNIA   gabapentin (NEURONTIN) 300 MG capsule Take 300 mg by mouth 3 (three) times daily.   levothyroxine (SYNTHROID, LEVOTHROID) 75 MCG tablet Take 75 mcg by mouth daily.    nystatin cream (MYCOSTATIN) Apply 1 application topically as needed.   pantoprazole (PROTONIX) 40 MG tablet Take 1 tablet by mouth 2 (two) times daily.   rifaximin (XIFAXAN) 550 MG TABS tablet Take 1 tablet (550 mg total) by mouth 3 (three) times daily.   sertraline (ZOLOFT) 100 MG tablet Take 2 tablets (200 mg total) by mouth daily.   metFORMIN (GLUCOPHAGE) 500 MG tablet Take 1 tablet daily with food for one week, then increase to 1 tablet BID with food (Patient not taking: Reported on 05/05/2022)   [DISCONTINUED] cetirizine (ZYRTEC) 10 MG tablet Take 1 tablet by mouth daily at 2 PM.   [DISCONTINUED] Eluxadoline (VIBERZI) 100 MG TABS Take 1 tablet (100 mg total) by mouth 2 (two) times daily. (Patient not taking: Reported on 05/05/2022)   [DISCONTINUED] hydrocortisone (ANUSOL-HC) 25 MG suppository Place 25 mg rectally as needed for hemorrhoids or anal itching.   [DISCONTINUED] tacrolimus (PROTOPIC) 0.03 % ointment Apply topically 2 (two) times daily. (Patient not taking: Reported on  05/05/2022)   No facility-administered encounter medications on file as of 05/05/2022.    Past Medical History:  Diagnosis Date   Allergy    Anxiety    Cataracts, bilateral    Colitis    Depression    Eosinophilic esophagitis    Fatty liver    Gastritis    GERD (gastroesophageal reflux disease)    Hyperlipidemia    Thyroid disease    TMJ (dislocation of temporomandibular joint)     Past Surgical History:  Procedure Laterality Date   BREAST ENHANCEMENT SURGERY     CESAREAN SECTION     NASAL SINUS SURGERY     tummy tuck     WRIST SURGERY Left     Family History  Problem Relation Age  of Onset   Anxiety disorder Mother    Panic disorder Mother    OCD Mother    Depression Mother    Arthritis Mother    High Cholesterol Mother    Anxiety disorder Father    Depression Father    Colon polyps Father    Heart disease Father    High Cholesterol Father    High blood pressure Father    Drug abuse Brother    Asthma Brother    COPD Brother    Depression Brother    Arthritis Maternal Grandmother    Depression Maternal Grandfather    Depression Paternal Grandmother    Alcohol abuse Paternal Grandfather    Heart disease Paternal Grandfather    Colon cancer Neg Hx    Esophageal cancer Neg Hx    Pancreatic cancer Neg Hx    Stomach cancer Neg Hx    Liver disease Neg Hx    Rectal cancer Neg Hx     Social History   Socioeconomic History   Marital status: Single    Spouse name: Not on file   Number of children: 1   Years of education: Not on file   Highest education level: Not on file  Occupational History   Occupation: MANAGER  Tobacco Use   Smoking status: Never    Passive exposure: Never   Smokeless tobacco: Never  Vaping Use   Vaping Use: Never used  Substance and Sexual Activity   Alcohol use: Yes    Comment: socially   Drug use: Never   Sexual activity: Not Currently  Other Topics Concern   Not on file  Social History Narrative   Not on file   Social Determinants of Health   Financial Resource Strain: Not on file  Food Insecurity: Not on file  Transportation Needs: Not on file  Physical Activity: Not on file  Stress: Not on file  Social Connections: Not on file  Intimate Partner Violence: Not on file    ROS Pertinent positives and negatives in the history of present illness.      Objective    BP 112/72 (BP Location: Left Arm, Patient Position: Sitting, Cuff Size: Large)   Pulse 100   Temp 97.6 F (36.4 C) (Temporal)   Ht 4' 10.25" (1.48 m)   Wt 157 lb (71.2 kg)   SpO2 96%   BMI 32.53 kg/m   Physical Exam Constitutional:       General: She is not in acute distress.    Appearance: She is not ill-appearing.  Eyes:     Extraocular Movements: Extraocular movements intact.     Conjunctiva/sclera: Conjunctivae normal.     Pupils: Pupils are equal, round, and reactive to light.  Cardiovascular:  Rate and Rhythm: Normal rate.  Pulmonary:     Effort: Pulmonary effort is normal.     Breath sounds: Normal breath sounds.  Musculoskeletal:        General: Normal range of motion.     Right hand: No tenderness. Normal range of motion. Normal strength. Decreased sensation. Normal capillary refill. Normal pulse.     Cervical back: Normal range of motion and neck supple.     Comments: Good sensation and motion of back  Skin:    General: Skin is warm and dry.     Capillary Refill: Capillary refill takes less than 2 seconds.  Neurological:     General: No focal deficit present.     Mental Status: She is alert and oriented to person, place, and time.     Motor: No weakness.     Coordination: Coordination normal.  Psychiatric:        Mood and Affect: Mood normal.        Assessment & Plan:   Problem List Items Addressed This Visit       Digestive   Chronic diarrhea    Reports stopping metformin improved diarrheal. Under the care of GI       Relevant Orders   CBC with Differential/Platelet (Completed)   Comprehensive metabolic panel (Completed)   Fatty liver    Avoid alcohol and eat a low fat diet in order to prevent worsening liver function        Relevant Orders   Comprehensive metabolic panel (Completed)     Endocrine   Hypothyroidism    Check thyroid function and follow up       Relevant Orders   TSH (Completed)     Nervous and Auditory   Chronic left-sided low back pain with left-sided sciatica    From recent mechanical fall. Continue conservative treatment and refer to PT. Lumbar XR ordered. Consider referral to orthopedist.        Relevant Medications   brexpiprazole (REXULTI) 1 MG  TABS tablet   Other Relevant Orders   DG Lumbar Spine Complete (Completed)   Ambulatory referral to Physical Therapy     Musculoskeletal and Integument   Temporomandibular joint disorder    Doing well on gabapentin for TMJ. Does not think Baclofen is helping. She may stop Baclofen         Other   Elevated liver enzymes    Recommend taking good care of her liver by eating a low fat diet and avoiding alcohol. Continue seeing GI       Relevant Orders   Comprehensive metabolic panel (Completed)   Generalized anxiety disorder    Continue seeing mental health specialist.        Prediabetes - Primary    Low sugar, low carb diet. Follow up pending A1c result.        Relevant Orders   CBC with Differential/Platelet (Completed)   Comprehensive metabolic panel (Completed)   TSH (Completed)   Hemoglobin A1c (Completed)   Right hand paresthesia   Relevant Orders   Vitamin B12 (Completed)   Ambulatory referral to Hand Surgery   Vitamin D deficiency    Follow up pending vitamin D level       Relevant Orders   VITAMIN D 25 Hydroxy (Vit-D Deficiency, Fractures) (Completed)   Visit time 35 minutes in face to face communication with patient and coordination of care, additional 15 minutes spent in record review, coordination or care, ordering tests, communicating/referring to other healthcare professionals,  documenting in medical records all on the same day of the visit for total time 50 minutes spent on the visit.    Return in about 3 months (around 08/05/2022).   Harland Dingwall, NP-C

## 2022-05-06 ENCOUNTER — Encounter: Payer: Self-pay | Admitting: Family Medicine

## 2022-05-06 DIAGNOSIS — G8929 Other chronic pain: Secondary | ICD-10-CM

## 2022-05-06 DIAGNOSIS — E559 Vitamin D deficiency, unspecified: Secondary | ICD-10-CM | POA: Insufficient documentation

## 2022-05-06 NOTE — Telephone Encounter (Signed)
Called pt and she has been scheduled with an ortho dr but he can only see hands. Pt would also like to see ortho for her back and leg and so I was able to call and confirm with [facility] that we would need to send a new referral to them for her to be seen for her back and leg pain.

## 2022-05-06 NOTE — Addendum Note (Signed)
Addended by: Rossie Muskrat on: 05/06/2022 04:45 PM   Modules accepted: Orders

## 2022-05-06 NOTE — Assessment & Plan Note (Signed)
Follow up pending vitamin D level

## 2022-05-06 NOTE — Assessment & Plan Note (Signed)
From recent mechanical fall. Continue conservative treatment and refer to PT. Lumbar XR ordered. Consider referral to orthopedist.

## 2022-05-06 NOTE — Assessment & Plan Note (Signed)
Reports stopping metformin improved diarrheal. Under the care of GI

## 2022-05-06 NOTE — Assessment & Plan Note (Signed)
Doing well on gabapentin for TMJ. Does not think Baclofen is helping. She may stop Baclofen

## 2022-05-06 NOTE — Assessment & Plan Note (Signed)
Avoid alcohol and eat a low fat diet in order to prevent worsening liver function

## 2022-05-06 NOTE — Assessment & Plan Note (Signed)
Recommend taking good care of her liver by eating a low fat diet and avoiding alcohol. Continue seeing GI

## 2022-05-06 NOTE — Assessment & Plan Note (Signed)
Check thyroid function and follow up

## 2022-05-06 NOTE — Assessment & Plan Note (Signed)
Continue seeing mental health specialist.

## 2022-05-06 NOTE — Assessment & Plan Note (Signed)
Low sugar, low carb diet. Follow up pending A1c result.

## 2022-05-07 ENCOUNTER — Telehealth: Payer: Self-pay | Admitting: Family Medicine

## 2022-05-07 NOTE — Telephone Encounter (Signed)
Amy Mcintyre called in and is requesting, per pt, that referral for Ortho be sent to [facility] in [place], [place].   Their fax number is [phone]

## 2022-05-07 NOTE — Telephone Encounter (Signed)
Referral faxed to number provided. Confirmation fax received.

## 2022-05-11 ENCOUNTER — Ambulatory Visit (INDEPENDENT_AMBULATORY_CARE_PROVIDER_SITE_OTHER): Payer: PRIVATE HEALTH INSURANCE | Admitting: Physician Assistant

## 2022-05-11 ENCOUNTER — Encounter: Payer: Self-pay | Admitting: Physician Assistant

## 2022-05-11 ENCOUNTER — Other Ambulatory Visit: Payer: Self-pay | Admitting: Physician Assistant

## 2022-05-11 VITALS — Ht 59.0 in | Wt 159.0 lb

## 2022-05-11 DIAGNOSIS — G8929 Other chronic pain: Secondary | ICD-10-CM

## 2022-05-11 DIAGNOSIS — M5442 Lumbago with sciatica, left side: Secondary | ICD-10-CM

## 2022-05-11 MED ORDER — MELOXICAM 7.5 MG PO TABS: 7.5000 mg | ORAL_TABLET | Freq: Every day | ORAL | 0 refills | Status: DC

## 2022-05-11 NOTE — Progress Notes (Signed)
Office Visit Note   Patient: Amy Mcintyre           Date of Birth: 1965/12/31           MRN: [id number] Visit Date: 05/11/2022              Requested by: Girtha Rm, NP-C [address] [place],  [place] [address] PCP: Girtha Rm, NP-C  Chief Complaint  Patient presents with   Lower Back - New Patient (Initial Visit)      HPI: Patient is a pleasant 56 year old woman with a 7-week history of low back pain.  She says she had her work Amy Mcintyre and her work badge fell off and she slipped on it with her left foot.  She was seen and evaluated by her primary care physician.  Physical therapy was ordered for her lower back and she begins this tomorrow.  She denies any paresthesias loss of bowel or bladder control or weakness.  The pain radiates down her left buttock to her posterior thigh to her knee.  Occasionally it will go all the way down her leg.  She is not taking any medication because she has some elevated liver enzymes and she tries to be careful about using that medication  Assessment & Plan: Visit Diagnoses:  1. Chronic left-sided low back pain with left-sided sciatica     Plan: Low back pain with left lower extremity radicular findings.  We talked about the natural history of this I did review her x-rays which show some degenerative changes at L4-5 L5-S1 but no other listhesis or alignment issues.  I will prescribe a low-dose of meloxicam that she will take just for a few weeks.  Certainly if she is concerned about her liver I told her she could contact she should contact her primary care provider.  We will follow-up in a month if she is no better could consider an MRI may cancel this appointment if she is doing well  Follow-Up Instructions: Return in about 1 month (around 06/10/2022).   Ortho Exam  Patient is alert, oriented, no adenopathy, well-dressed, normal affect, normal respiratory effort. Examination she has a slight antalgic gait on the left.  She does have  reproduced pain in her left buttock down the back of her thigh with forward flexion.  No pain with extension of her back or side to side bending.  She has 5 out of 5 strength bilaterally with resisted dorsiflexion and plantarflexion of her ankles resisted extension and flexion of her knees flexion of her hips.  She has a mild straight leg raise on the left which reproduces pain in the back of her thigh.  Sensation is intact.  She has good deep tendon reflexes  Imaging: No results found. No images are attached to the encounter.  Labs: Lab Results  Component Value Date   HGBA1C 6.2 05/05/2022   ESRSEDRATE 7 09/25/2018   CRP 0.2 (L) 09/25/2018     Lab Results  Component Value Date   ALBUMIN 4.6 05/05/2022   ALBUMIN 4.4 01/05/2022   ALBUMIN 4.7 12/13/2018    No results found for: MG Lab Results  Component Value Date   VD25OH 65.10 05/05/2022    No results found for: PREALBUMIN    Latest Ref Rng & Units 05/05/2022    8:55 AM 01/05/2022    9:38 AM 12/13/2018   11:58 AM  CBC EXTENDED  WBC 4.0 - 10.5 K/uL 5.4   4.7   4.6  RBC 3.87 - 5.11 Mil/uL 4.36   4.12   4.47    Hemoglobin 12.0 - 15.0 g/dL 13.2   12.5   13.5    HCT 36.0 - 46.0 % 39.6   37.8   40.6    Platelets 150.0 - 400.0 K/uL 212.0   204.0   258.0    NEUT# 1.4 - 7.7 K/uL 2.9   2.7   2.8    Lymph# 0.7 - 4.0 K/uL 1.9   1.5   1.4       Body mass index is 32.11 kg/m.  Orders:  No orders of the defined types were placed in this encounter.  Meds ordered this encounter  Medications   meloxicam (MOBIC) 7.5 MG tablet    Sig: Take 1 tablet (7.5 mg total) by mouth daily.    Dispense:  21 tablet    Refill:  0     Procedures: No procedures performed  Clinical Data: No additional findings.  ROS:  All other systems negative, except as noted in the HPI. Review of Systems  Objective: Vital Signs: Ht '4\' 11"'$  (1.499 m)   Wt 159 lb (72.1 kg)   BMI 32.11 kg/m   Specialty Comments:  No specialty comments  available.  PMFS History: Patient Active Problem List   Diagnosis Date Noted   Vitamin D deficiency 05/06/2022   Right hand paresthesia 05/05/2022   Insulin resistance 05/05/2022   Elevated liver enzymes 05/05/2022   Allergic rhinitis 05/05/2022   Alopecia 05/05/2022   Carpal tunnel syndrome of right wrist 05/05/2022   Chronic myofascial pain 05/05/2022   Diarrhea 05/05/2022   Fatigue 05/05/2022   Major depression 05/05/2022   High risk medication use 05/05/2022   Prediabetes 05/05/2022   Pericapsulitis of shoulder 05/05/2022   Palpitation 05/05/2022   Rash 05/05/2022   Vitamin D toxicity 05/05/2022   Polyarthropathy 05/05/2022   BMI 31.0-31.9,adult 05/05/2022   Menopausal state 05/05/2022   Chronic left-sided low back pain with left-sided sciatica 05/05/2022   Fatty liver 05/05/2022   Chronic diarrhea 05/05/2022   Tubular adenoma 01/29/2022   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   Gastric reflux 01/29/2022   Insomnia 09/22/2018   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   Generalized anxiety disorder 09/11/2018   Hypothyroidism 08/17/2018   Seasonal allergies 05/22/2012   Premenstrual dysphoric disorder 05/22/2012   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   Hypercholesterolemia 02/13/2004   Past Medical History:  Diagnosis Date   Allergy    Anxiety    Cataracts, bilateral    Colitis    Depression    Eosinophilic esophagitis    Fatty liver    Gastritis    GERD (gastroesophageal reflux disease)    Hyperlipidemia    Thyroid disease    TMJ (dislocation of temporomandibular joint)     Family History  Problem Relation Age of Onset   Anxiety disorder Mother    Panic disorder Mother    OCD Mother    Depression Mother    Arthritis Mother    High Cholesterol Mother    Anxiety disorder Father    Depression Father    Colon polyps Father    Heart disease Father    High Cholesterol Father    High blood pressure Father    Drug  abuse Brother    Asthma Brother    COPD Brother    Depression Brother    Arthritis Maternal Grandmother    Depression Maternal Grandfather    Depression Paternal Grandmother  Alcohol abuse Paternal Grandfather    Heart disease Paternal Grandfather    Colon cancer Neg Hx    Esophageal cancer Neg Hx    Pancreatic cancer Neg Hx    Stomach cancer Neg Hx    Liver disease Neg Hx    Rectal cancer Neg Hx     Past Surgical History:  Procedure Laterality Date   BREAST ENHANCEMENT SURGERY     CESAREAN SECTION     NASAL SINUS SURGERY     tummy tuck     WRIST SURGERY Left    Social History   Occupational History   Occupation: MANAGER  Tobacco Use   Smoking status: Never    Passive exposure: Never   Smokeless tobacco: Never  Vaping Use   Vaping Use: Never used  Substance and Sexual Activity   Alcohol use: Yes    Comment: socially   Drug use: Never   Sexual activity: Not Currently

## 2022-05-13 ENCOUNTER — Encounter: Payer: Self-pay | Admitting: Orthopedic Surgery

## 2022-05-13 ENCOUNTER — Ambulatory Visit (INDEPENDENT_AMBULATORY_CARE_PROVIDER_SITE_OTHER): Payer: PRIVATE HEALTH INSURANCE | Admitting: Orthopedic Surgery

## 2022-05-13 VITALS — BP 122/86 | HR 81 | Ht 59.0 in | Wt 159.0 lb

## 2022-05-13 DIAGNOSIS — R202 Paresthesia of skin: Secondary | ICD-10-CM

## 2022-05-13 NOTE — Progress Notes (Signed)
Office Visit Note   Patient: Amy Mcintyre           Date of Birth: [birth year]           MRN: [id number] Visit Date: 05/13/2022              Requested by: Girtha Rm, NP-C [place],  [place] [address] PCP: Girtha Rm, NP-C   Assessment & Plan: Visit Diagnoses:  1. Right hand paresthesia     Plan: Discussed with patient that she does have some history and exam findings that could be consistent with carpal tunnel syndrome.  We reviewed the nature of carpal tunnel syndrome as well as the diagnosis, prognosis, both conservative and surgical treatment options.  I would like to get a nerve conduction study/EMG to further her evaluate her symptoms.  She can continue to wear night braces to see if this provides any symptom relief.  I can see her back once the electrodiagnostic studies completed.  Follow-Up Instructions: No follow-ups on file.   Orders:  No orders of the defined types were placed in this encounter.  No orders of the defined types were placed in this encounter.     Procedures: No procedures performed   Clinical Data: No additional findings.   Subjective: Chief Complaint  Patient presents with   Right Hand - Numbness, Weakness    RIGHT Handed, + n/t, weakness, swelling, states it started in the tip of the thumb and has gradually went to the other fingers, wakes her up at night     This is a 56 year old right-hand-dominant female presents with numbness and tingling in the right hand.  This started several months ago with numbness and paresthesias in the tip of her thumb.  This gradually spread to the tips of all of her fingers followed by the entire finger and hand.  The small finger is essentially not involved.  This bothers her both during the day and at night.  She has nocturnal symptoms 2-3 times per week and when she wakes and shakes her hand for symptom relief.  She is difficult to discern test during the day such as writing.  She feels  she cannot hold a pen well and has noticed a decrease in her penmanship.  She has constant mild tingling that seems to get better and worse throughout the day.  She was told years ago that she had carpal tunnel syndrome and wore a custom wrist brace at that time.  Her symptoms seem to have resolved but have returned.  She is wearing a night brace but notes that it does not provide much symptom relief.  She never had any electrodiagnostic studies.  She does have a history of hypothyroidism.  She denies history of diabetes, wrist trauma, cervical spine issue, inflammatory neuropathy.  Weakness Associated symptoms include weakness.    Review of Systems  Neurological:  Positive for weakness.     Objective: Vital Signs: BP 122/86 (BP Location: Left Arm, Patient Position: Sitting)   Pulse 81   Ht '4\' 11"'$  (1.499 m)   Wt 159 lb (72.1 kg)   BMI 32.11 kg/m   Physical Exam Constitutional:      Appearance: Normal appearance.  Cardiovascular:     Rate and Rhythm: Normal rate.     Pulses: Normal pulses.  Pulmonary:     Effort: Pulmonary effort is normal.  Skin:    General: Skin is warm and dry.     Capillary Refill: Capillary  refill takes less than 2 seconds.  Neurological:     Mental Status: She is alert.     Right Hand Exam   Tenderness  The patient is experiencing no tenderness.   Range of Motion  The patient has normal right wrist ROM.   Other  Erythema: absent Sensation: normal Pulse: present  Comments:  Negative Tinel sign.  Positive Phalen and Durkan signs into thumb, index, middle, and ring fingers. Small finger not involved.  5/5 thenar motor strength.       Specialty Comments:  No specialty comments available.  Imaging: No results found.   PMFS History: Patient Active Problem List   Diagnosis Date Noted   Vitamin D deficiency 05/06/2022   Right hand paresthesia 05/05/2022   Insulin resistance 05/05/2022   Elevated liver enzymes 05/05/2022   Allergic  rhinitis 05/05/2022   Alopecia 05/05/2022   Carpal tunnel syndrome of right wrist 05/05/2022   Chronic myofascial pain 05/05/2022   Diarrhea 05/05/2022   Fatigue 05/05/2022   Major depression 05/05/2022   High risk medication use 05/05/2022   Prediabetes 05/05/2022   Pericapsulitis of shoulder 05/05/2022   Palpitation 05/05/2022   Rash 05/05/2022   Vitamin D toxicity 05/05/2022   Polyarthropathy 05/05/2022   BMI 31.0-31.9,adult 05/05/2022   Menopausal state 05/05/2022   Chronic left-sided low back pain with left-sided sciatica 05/05/2022   Fatty liver 05/05/2022   Chronic diarrhea 05/05/2022   Tubular adenoma 01/29/2022   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   Gastric reflux 01/29/2022   Insomnia 09/22/2018   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   Hypercholesterolemia 02/13/2004   Past Medical History:  Diagnosis Date   Allergy    Anxiety    Cataracts, bilateral    Colitis    Depression    Eosinophilic esophagitis    Fatty liver    Gastritis    GERD (gastroesophageal reflux disease)    Hyperlipidemia    Thyroid disease    TMJ (dislocation of temporomandibular joint)     Family History  Problem Relation Age of Onset   Anxiety disorder Mother    Panic disorder Mother    OCD Mother    Depression Mother    Arthritis Mother    High Cholesterol Mother    Anxiety disorder Father    Depression Father    Colon polyps Father    Heart disease Father    High Cholesterol Father    High blood pressure Father    Drug abuse Brother    Asthma Brother    COPD Brother    Depression Brother    Arthritis Maternal Grandmother    Depression Maternal Grandfather    Depression Paternal Grandmother    Alcohol abuse Paternal Grandfather    Heart disease  Paternal Grandfather    Colon cancer Neg Hx    Esophageal cancer Neg Hx    Pancreatic cancer Neg Hx    Stomach cancer Neg Hx    Liver disease Neg Hx    Rectal cancer Neg Hx     Past Surgical History:  Procedure Laterality Date   BREAST ENHANCEMENT SURGERY     CESAREAN SECTION     NASAL SINUS SURGERY     tummy tuck     WRIST SURGERY Left    Social History   Occupational History   Occupation:  MANAGER  Tobacco Use   Smoking status: Never    Passive exposure: Never   Smokeless tobacco: Never  Vaping Use   Vaping Use: Never used  Substance and Sexual Activity   Alcohol use: Yes    Comment: socially   Drug use: Never   Sexual activity: Not Currently

## 2022-05-21 ENCOUNTER — Telehealth: Payer: Self-pay | Admitting: Pharmacy Technician

## 2022-05-21 NOTE — Telephone Encounter (Signed)
Patient Advocate Encounter  Received notification from [facility] that prior authorization for XIFAXAN '550MG'$  is required.   PA submitted on 6.16.23 Key [id number] Status is pending    Luciano Cutter, CPhT Patient Advocate Phone: [phone]

## 2022-05-25 ENCOUNTER — Encounter: Payer: Self-pay | Admitting: Psychiatry

## 2022-05-25 ENCOUNTER — Ambulatory Visit (INDEPENDENT_AMBULATORY_CARE_PROVIDER_SITE_OTHER): Payer: PRIVATE HEALTH INSURANCE | Admitting: Psychiatry

## 2022-05-25 DIAGNOSIS — F330 Major depressive disorder, recurrent, mild: Secondary | ICD-10-CM | POA: Diagnosis not present

## 2022-05-25 DIAGNOSIS — F5101 Primary insomnia: Secondary | ICD-10-CM

## 2022-05-25 DIAGNOSIS — F411 Generalized anxiety disorder: Secondary | ICD-10-CM | POA: Diagnosis not present

## 2022-05-25 MED ORDER — CLONAZEPAM 1 MG PO TABS: ORAL_TABLET | ORAL | 3 refills | Status: DC

## 2022-05-25 MED ORDER — SERTRALINE HCL 100 MG PO TABS: 200.0000 mg | ORAL_TABLET | Freq: Every day | ORAL | 1 refills | Status: DC

## 2022-05-25 MED ORDER — BREXPIPRAZOLE 1 MG PO TABS: 1.0000 mg | ORAL_TABLET | Freq: Every day | ORAL | 1 refills | Status: DC

## 2022-05-25 NOTE — Progress Notes (Unsigned)
Amy Mcintyre [id number] 01-25-1966 56 y.o.  Subjective:   Patient ID:  Amy Mcintyre is a 56 y.o. (DOB 12-Apr-1966) female.  Chief Complaint: No chief complaint on file.   HPI Amy Mcintyre presents to the office today for follow-up of *** She reports that she has "been doing really well." She is not needing to take Klonopin as often. No longer needing Klonopin up to three times daily most days. She reports that she had some anxiety at times, such as when she is given an assignment with a deadline tomorrow when she has other meetings and responsibilities scheduled. Denies any recent panic attacks. She reports "for the most part work has been better." She has a few close friends at work that she trusts and otherwise does not share personal information with other coworkers. Has been reading for fun again. Occ urges to cry without a reason. She reports that her depression has been controlled. Difficulty falling asleep. Typically stays asleep without difficulty. She reports that her appetite has been unchanged. She reports that food intake has not changed. Energy and motivation have been good. Concentration has been adequate. Denies SI.   Continues to see Rodena Goldmann for therapy.   She reports that her liver enzymes are elevated and Hgb A1C is 6.2.  She reports that she stopped taking Metformin due to diarrhea.   Amy Mcintyre bought a house off of [address]. She is planning to let go of her rental house and then move into [place] in [place]. She is thinking about retiring in the future. Planning to marry Amy Mcintyre before the end of the year. Considering re-starting some previous hobbies, such as reading and crafts.   Past Psychiatric Medication Trials: Paxil Prozac Sertraline Nortriptyline Wellbutrin- not helpful for depression.  Doxepin BuSpar Abilify-increased heart rate Rexulti-Helpful for mood and anxiety Seroquel-adverse effects Gabapentin- excessive somnolence with TID prn Lunesta-not  effective Ambien- parasomnias Trazodone-ineffective Belsomra-worsening insomnia Klonopin  AIMS    Flowsheet Row Office Visit from 05/25/2022 in [facility] Office Visit from 01/29/2020 in [facility] Visit from 10/04/2019 in [facility] Total Score 0 0 0      PHQ2-9    [place] Office Visit from 05/05/2022 in [place] at [facility]  PHQ-2 Total Score 0  PHQ-9 Total Score 1        Review of Systems:  Review of Systems  Gastrointestinal:  Negative for diarrhea.  Musculoskeletal:  Negative for gait problem.  Neurological:  Negative for tremors.  Psychiatric/Behavioral:         Please refer to HPI    Medications: I have reviewed the patient's current medications.  Current Outpatient Medications  Medication Sig Dispense Refill   Barberry-Oreg Grape-Goldenseal (BERBERINE COMPLEX PO) Take by mouth.     milk thistle 175 MG tablet Take 175 mg by mouth daily.     Ascorbic Acid (VITAMIN C) 100 MG tablet Take 100 mg by mouth daily.     atorvastatin (LIPITOR) 40 MG tablet Take 40 mg by mouth at bedtime.     baclofen (LIORESAL) 20 MG tablet Take 20 mg by mouth 3 (three) times daily as needed.     brexpiprazole (REXULTI) 1 MG TABS tablet Take by mouth daily. At bedtime     cholecalciferol (VITAMIN D3) 25 MCG (1000 UNIT) tablet Take 1,000 Units by mouth daily.     clonazePAM (KLONOPIN) 1 MG tablet TAKE 1/2 TO 1 (ONE-HALF TO ONE) TABLET BY MOUTH THREE TIMES DAILY AS  NEEDED FOR PANIC ATTACK OR INSOMNIA 90 tablet 5   gabapentin (NEURONTIN) 300 MG capsule Take 300 mg by mouth 3 (three) times daily.     levothyroxine (SYNTHROID, LEVOTHROID) 75 MCG tablet Take 75 mcg by mouth daily.      meloxicam (MOBIC) 7.5 MG tablet Take 1 tablet (7.5 mg total) by mouth daily. 21 tablet 0   metFORMIN (GLUCOPHAGE) 500 MG tablet Take 1 tablet daily with food for one week, then increase to 1 tablet BID with food (Patient not taking:  Reported on 05/05/2022) 180 tablet 0   nystatin cream (MYCOSTATIN) Apply 1 application topically as needed.     pantoprazole (PROTONIX) 40 MG tablet Take 1 tablet by mouth 2 (two) times daily.     rifaximin (XIFAXAN) 550 MG TABS tablet Take 1 tablet (550 mg total) by mouth 3 (three) times daily. 42 tablet 0   sertraline (ZOLOFT) 100 MG tablet Take 2 tablets (200 mg total) by mouth daily. 180 tablet 1   No current facility-administered medications for this visit.    Medication Side Effects: Other: Drooling some during the day.  Allergies:  Allergies  Allergen Reactions   Ambien [Zolpidem Tartrate]    Erythromycin Swelling   Penicillins Swelling    Past Medical History:  Diagnosis Date   Allergy    Anxiety    Cataracts, bilateral    Colitis    Depression    Eosinophilic esophagitis    Fatty liver    Gastritis    GERD (gastroesophageal reflux disease)    Hyperlipidemia    Thyroid disease    TMJ (dislocation of temporomandibular joint)     Past Medical History, Surgical history, Social history, and Family history were reviewed and updated as appropriate.   Please see review of systems for further details on the patient's review from today.   Objective:   Physical Exam:  Wt 159 lb (72.1 kg)   BMI 32.11 kg/m   Physical Exam  Lab Review:     Component Value Date/Time   NA 139 05/05/2022 0855   K 4.0 05/05/2022 0855   CL 103 05/05/2022 0855   CO2 24 05/05/2022 0855   GLUCOSE 113 (H) 05/05/2022 0855   BUN 11 05/05/2022 0855   CREATININE 0.76 05/05/2022 0855   CALCIUM 10.0 05/05/2022 0855   PROT 7.4 05/05/2022 0855   ALBUMIN 4.6 05/05/2022 0855   AST 48 (H) 05/05/2022 0855   ALT 75 (H) 05/05/2022 0855   ALT 39 (H) 04/18/2019 0821   ALKPHOS 88 05/05/2022 0855   BILITOT 0.3 05/05/2022 0855       Component Value Date/Time   WBC 5.4 05/05/2022 0855   RBC 4.36 05/05/2022 0855   HGB 13.2 05/05/2022 0855   HCT 39.6 05/05/2022 0855   PLT 212.0 05/05/2022 0855    MCV 90.9 05/05/2022 0855   MCHC 33.4 05/05/2022 0855   RDW 14.2 05/05/2022 0855   LYMPHSABS 1.9 05/05/2022 0855   MONOABS 0.4 05/05/2022 0855   EOSABS 0.1 05/05/2022 0855   BASOSABS 0.0 05/05/2022 0855    No results found for: "POCLITH", "LITHIUM"   No results found for: "PHENYTOIN", "PHENOBARB", "VALPROATE", "CBMZ"   .res Assessment: Plan:    There are no diagnoses linked to this encounter.   Please see After Visit Summary for patient specific instructions.  Future Appointments  Date Time Provider [place]  06/11/2022  8:30 AM Persons, Bevely Palmer, [place] [facility] None  06/16/2022  8:30 AM Magnus Sinning, [place] [facility] None  07/28/2022  8:00 AM Henson, Vickie L, NP-C [facility] None    No orders of the defined types were placed in this encounter.   -------------------------------

## 2022-05-30 ENCOUNTER — Other Ambulatory Visit (HOSPITAL_COMMUNITY): Payer: Self-pay

## 2022-06-11 ENCOUNTER — Encounter: Payer: Self-pay | Admitting: Physician Assistant

## 2022-06-11 ENCOUNTER — Ambulatory Visit (INDEPENDENT_AMBULATORY_CARE_PROVIDER_SITE_OTHER): Payer: PRIVATE HEALTH INSURANCE | Admitting: Physician Assistant

## 2022-06-11 DIAGNOSIS — G8929 Other chronic pain: Secondary | ICD-10-CM

## 2022-06-11 DIAGNOSIS — M5442 Lumbago with sciatica, left side: Secondary | ICD-10-CM

## 2022-06-11 NOTE — Progress Notes (Signed)
Office Visit Note   Patient: Amy Mcintyre           Date of Birth: 10-Apr-1966           MRN: [id number] Visit Date: 06/11/2022              Requested by: Girtha Rm, NP-C [place],  [place] [address] PCP: Girtha Rm, NP-C      HPI: Patient is a 56 year old woman who follows up today for her lower back pain that radiates down the posterior aspect of her left leg.  Does not go beyond the knee.  She has been compliant in doing physical therapy for several weeks.  She is also taken anti-inflammatories.  This injury occurred when her ID badge for work fell on the floor and she slipped on it and caught herself.  She has failed conservative treatment including anti-inflammatories and 6 weeks of physical therapy she is only gotten transient relief when she does the therapy  Assessment & Plan: Visit Diagnoses:  1. Chronic left-sided low back pain with left-sided sciatica     Plan: Given her symptoms we will now move forward with an MRI of her lower back.  She can follow-up with Dr. Durward Fortes to review this.  Follow-Up Instructions: Return After MRI.   Ortho Exam  Patient is alert, oriented, no adenopathy, well-dressed, normal affect, normal respiratory effort. Examination of her lower back she has no deformity.  She does have pain and stretching with forward bending she has pulling in her lower buttock and leg with straight leg raising strength is intact sensation is intact  Imaging: No results found. No images are attached to the encounter.  Labs: Lab Results  Component Value Date   HGBA1C 6.2 05/05/2022   ESRSEDRATE 7 09/25/2018   CRP 0.2 (L) 09/25/2018     Lab Results  Component Value Date   ALBUMIN 4.6 05/05/2022   ALBUMIN 4.4 01/05/2022   ALBUMIN 4.7 12/13/2018    No results found for: "MG" Lab Results  Component Value Date   VD25OH 65.10 05/05/2022    No results found for: "PREALBUMIN"    Latest Ref Rng & Units 05/05/2022    8:55 AM  01/05/2022    9:38 AM 12/13/2018   11:58 AM  CBC EXTENDED  WBC 4.0 - 10.5 K/uL 5.4  4.7  4.6   RBC 3.87 - 5.11 Mil/uL 4.36  4.12  4.47   Hemoglobin 12.0 - 15.0 g/dL 13.2  12.5  13.5   HCT 36.0 - 46.0 % 39.6  37.8  40.6   Platelets 150.0 - 400.0 K/uL 212.0  204.0  258.0   NEUT# 1.4 - 7.7 K/uL 2.9  2.7  2.8   Lymph# 0.7 - 4.0 K/uL 1.9  1.5  1.4      There is no height or weight on file to calculate BMI.  Orders:  Orders Placed This Encounter  Procedures   MR Lumbar Spine w/o contrast   No orders of the defined types were placed in this encounter.    Procedures: No procedures performed  Clinical Data: No additional findings.  ROS:  All other systems negative, except as noted in the HPI. Review of Systems  Objective: Vital Signs: There were no vitals taken for this visit.  Specialty Comments:  No specialty comments available.  PMFS History: Patient Active Problem List   Diagnosis Date Noted   Vitamin D deficiency 05/06/2022   Right hand paresthesia 05/05/2022   Insulin  resistance 05/05/2022   Elevated liver enzymes 05/05/2022   Allergic rhinitis 05/05/2022   Alopecia 05/05/2022   Carpal tunnel syndrome of right wrist 05/05/2022   Chronic myofascial pain 05/05/2022   Diarrhea 05/05/2022   Fatigue 05/05/2022   Major depression 05/05/2022   High risk medication use 05/05/2022   Prediabetes 05/05/2022   Pericapsulitis of shoulder 05/05/2022   Palpitation 05/05/2022   Rash 05/05/2022   Vitamin D toxicity 05/05/2022   Polyarthropathy 05/05/2022   BMI 31.0-31.9,adult 05/05/2022   Menopausal state 05/05/2022   Chronic left-sided low back pain with left-sided sciatica 05/05/2022   Fatty liver 05/05/2022   Chronic diarrhea 05/05/2022   Tubular adenoma 01/29/2022   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   Gastric reflux 01/29/2022   Insomnia 09/22/2018   Severe recurrent major depression without psychotic features ([place]) 09/11/2018   Generalized anxiety  disorder 09/11/2018   Hypothyroidism 08/17/2018   Seasonal allergies 05/22/2012   Premenstrual dysphoric disorder 05/22/2012   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   Hypercholesterolemia 02/13/2004   Past Medical History:  Diagnosis Date   Allergy    Anxiety    Cataracts, bilateral    Colitis    Depression    Eosinophilic esophagitis    Fatty liver    Gastritis    GERD (gastroesophageal reflux disease)    Hyperlipidemia    Thyroid disease    TMJ (dislocation of temporomandibular joint)     Family History  Problem Relation Age of Onset   Anxiety disorder Mother    Panic disorder Mother    OCD Mother    Depression Mother    Arthritis Mother    High Cholesterol Mother    Anxiety disorder Father    Depression Father    Colon polyps Father    Heart disease Father    High Cholesterol Father    High blood pressure Father    Drug abuse Brother    Asthma Brother    COPD Brother    Depression Brother    Arthritis Maternal Grandmother    Depression Maternal Grandfather    Depression Paternal Grandmother    Alcohol abuse Paternal Grandfather    Heart disease Paternal Grandfather    Colon cancer Neg Hx    Esophageal cancer Neg Hx    Pancreatic cancer Neg Hx    Stomach cancer Neg Hx    Liver disease Neg Hx    Rectal cancer Neg Hx     Past Surgical History:  Procedure Laterality Date   BREAST ENHANCEMENT SURGERY     CESAREAN SECTION     NASAL SINUS SURGERY     tummy tuck     WRIST SURGERY Left    Social History   Occupational History   Occupation: MANAGER  Tobacco Use   Smoking status: Never    Passive exposure: Never   Smokeless tobacco: Never  Vaping Use   Vaping Use: Never used  Substance and Sexual Activity   Alcohol use: Yes    Comment: socially   Drug use: Never   Sexual activity: Not Currently

## 2022-06-16 ENCOUNTER — Ambulatory Visit: Payer: Self-pay

## 2022-06-16 ENCOUNTER — Ambulatory Visit (INDEPENDENT_AMBULATORY_CARE_PROVIDER_SITE_OTHER): Payer: PRIVATE HEALTH INSURANCE | Admitting: Physical Medicine and Rehabilitation

## 2022-06-16 ENCOUNTER — Encounter: Payer: Self-pay | Admitting: Physical Medicine and Rehabilitation

## 2022-06-16 DIAGNOSIS — M47816 Spondylosis without myelopathy or radiculopathy, lumbar region: Secondary | ICD-10-CM | POA: Diagnosis not present

## 2022-06-16 DIAGNOSIS — R202 Paresthesia of skin: Secondary | ICD-10-CM

## 2022-06-16 MED ORDER — METHYLPREDNISOLONE ACETATE 80 MG/ML IJ SUSP: 80.0000 mg | Freq: Once | INTRAMUSCULAR | Status: DC

## 2022-06-16 NOTE — Progress Notes (Signed)
Amy Mcintyre - 56 y.o. female MRN [id number]  Date of birth: 12/04/1966  Office Visit Note: Visit Date: 06/16/2022 PCP: Girtha Rm, NP-C Referred by: Sherilyn Cooter, MD  Subjective: Chief Complaint  Patient presents with   Right Hand - Numbness, Pain   Right Wrist - Pain, Numbness   HPI:  Amy Mcintyre is a 56 y.o. female who comes in today at the request of Dr. Sherilyn Cooter for electrodiagnostic study of the Right upper extremities.  Patient is Right hand dominant.  She reports chronic worsening severe hand pain with numbness and tingling in essentially all the digits except the fifth digit.  This seems to be worsening over time and she does get nocturnal complaints with a positive flick sign.  Denies any left-sided symptoms.  Denies any frank radicular symptoms.  No prior electrodiagnostic studies.  She was told in the past that she had carpal tunnel syndrome but this got better with the brace at the time.  She has failed conservative care at this point.   ROS Otherwise per HPI.  Assessment & Plan: Visit Diagnoses:    ICD-10-CM   1. 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 The lesion is characterized by sensory and motor demyelination without significant evidence of axonal injury.   There is no significant electrodiagnostic evidence of any other focal nerve entrapment, brachial plexopathy or cervical radiculopathy.   Recommendations: 1.  Follow-up with referring physician. 2.  Continue current management of symptoms. 3.  Suggest surgical evaluation.  Meds & Orders:   Meds ordered this encounter  Medications   methylPREDNISolone acetate (DEPO-MEDROL) injection 80 mg    Orders Placed This Encounter  Procedures   NCV with EMG (electromyography)   Epidural Steroid injection    Follow-up: Return in about 2 weeks (around 06/30/2022) for Sherilyn Cooter, MD.   Procedures: No procedures performed  EMG & NCV Findings: Evaluation of the right median motor nerve showed prolonged distal onset latency (7.0 ms) and decreased conduction velocity (Elbow-Wrist, 43 m/s).  The right median (across palm) sensory nerve showed prolonged distal peak latency (Wrist, 6.3 ms), reduced amplitude (5.8 V), and prolonged distal peak latency (Palm, 8.4 ms).  All remaining nerves (as indicated in the following tables) were within normal limits.    All examined muscles (as indicated in the following table) showed no evidence of electrical instability.    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 The lesion is characterized by sensory and motor demyelination without significant evidence of axonal injury.   There is no significant electrodiagnostic evidence of any other focal nerve entrapment, brachial plexopathy or cervical radiculopathy.   Recommendations: 1.  Follow-up with referring physician. 2.  Continue current management of symptoms. 3.  Suggest surgical evaluation.  ___________________________ Wonda Olds Board Certified, American Board of Physical Medicine and Rehabilitation    Nerve Conduction Studies Anti Sensory Summary Table   Stim Site NR Peak (ms) Norm Peak (ms) P-T Amp (V) Norm P-T Amp Site1 Site2  Delta-P (ms) Dist (cm) Vel (m/s) Norm Vel (m/s)  Right Median Acr Palm Anti Sensory (2nd Digit)  30.9C  Wrist    *6.3 <3.6 *5.8 >10 Wrist Palm 2.1 0.0    Palm    *8.4 <2.0 6.7          Right Radial Anti Sensory (Base 1st Digit)  31.3C  Wrist    2.0 <3.1 30.9  Wrist Base 1st Digit 2.0 0.0    Right Ulnar Anti Sensory (5th Digit)  31.2C  Wrist    2.7 <3.7 25.2 >15.0 Wrist 5th Digit 2.7 14.0 52 >38   Motor Summary Table   Stim Site NR Onset (ms) Norm Onset (ms) O-P Amp (mV) Norm O-P Amp Site1 Site2 Delta-0 (ms) Dist (cm) Vel (m/s) Norm Vel (m/s)  Right Median Motor (Abd Poll Brev)  31.4C  Wrist    *7.0 <4.2 5.4 >5 Elbow Wrist 3.6 15.3 *43 >50  Elbow    10.6  5.1         Right Ulnar Motor (Abd Dig Min)  31.6C  Wrist    2.6 <4.2 8.7 >3 B Elbow Wrist 2.2 15.0 68 >53  B Elbow    4.8  9.1  A Elbow B Elbow 1.3 10.0 77 >53  A Elbow    6.1  9.0          EMG   Side Muscle Nerve Root Ins Act Fibs Psw Amp Dur Poly Recrt Int Fraser Din Comment  Right Abd Poll Brev Median C8-T1 Nml Nml Nml Nml Nml 0 Nml Nml   Right 1stDorInt Ulnar C8-T1 Nml Nml Nml Nml Nml 0 Nml Nml   Right PronatorTeres Median C6-7 Nml Nml Nml Nml Nml 0 Nml Nml   Right Biceps Musculocut C5-6 Nml Nml Nml Nml Nml 0 Nml Nml   Right Deltoid Axillary C5-6 Nml Nml Nml Nml Nml 0 Nml Nml     Nerve Conduction Studies Anti Sensory Left/Right Comparison   Stim Site L Lat (ms) R Lat (ms) L-R Lat (ms) L Amp (V) R Amp (V) L-R Amp (%) Site1 Site2 L Vel (m/s) R Vel (m/s) L-R Vel (m/s)  Median Acr Palm Anti Sensory (2nd Digit)  30.9C  Wrist  *6.3   *5.8  Wrist Palm     Palm  *8.4   6.7        Radial Anti Sensory (Base 1st Digit)  31.3C  Wrist  2.0   30.9  Wrist Base 1st Digit     Ulnar Anti Sensory (5th Digit)  31.2C  Wrist  2.7   25.2  Wrist 5th Digit  52    Motor Left/Right Comparison   Stim Site L Lat (ms) R Lat (ms) L-R Lat (ms) L Amp (mV) R Amp (mV) L-R Amp (%) Site1 Site2 L Vel (m/s) R Vel (m/s) L-R Vel (m/s)  Median Motor (Abd Poll Brev)  31.4C  Wrist  *7.0   5.4  Elbow Wrist  *43   Elbow  10.6   5.1        Ulnar Motor (Abd Dig Min)  31.6C  Wrist  2.6   8.7  B Elbow Wrist  68   B Elbow  4.8   9.1  A Elbow  B Elbow  77   A Elbow  6.1   9.0           Waveforms:             Clinical History: No specialty comments available.  Objective:  VS:  HT:    WT:   BMI:     BP:   HR: bpm  TEMP: ( )  RESP:  Physical Exam Musculoskeletal:        General: No swelling, tenderness or deformity.     Comments: Inspection reveals no atrophy of the bilateral APB or FDI or hand intrinsics. There is no swelling, color changes, allodynia or dystrophic changes. There is 5 out of 5 strength in the bilateral wrist extension, finger abduction and long finger flexion. There is intact sensation to light touch in all dermatomal and peripheral nerve distributions.  There is a positive Phalen's test on the right. There is a negative Hoffmann's test bilaterally.  Skin:    General: Skin is warm and dry.     Findings: No erythema or rash.  Neurological:     General: No focal deficit present.     Mental Status: She is alert and oriented to person, place, and time.     Motor: No weakness or abnormal muscle tone.     Coordination: Coordination normal.  Psychiatric:        Mood and Affect: Mood normal.        Behavior: Behavior normal.      Imaging: No results found.

## 2022-06-16 NOTE — Progress Notes (Signed)
Pt state pain and numbness in her right hand, wrist and forearm. Pt state she has numbness in her thumb, pointer and middle finger. Pt state it hard for her to hold on to items. Pt state the pain wakes her up at night. Pt state she uses a brace and try to shake her hands to stop the numbness. Pt state she right handed.  Numeric Pain Rating Scale and Functional Assessment Average Pain 6   In the last MONTH (on 0-10 scale) has pain interfered with the following?  1. General activity like being  able to carry out your everyday physical activities such as walking, climbing stairs, carrying groceries, or moving a chair?  Rating(10)   -BT, -Dye Allergies.

## 2022-06-22 ENCOUNTER — Encounter: Payer: Self-pay | Admitting: Physical Medicine and Rehabilitation

## 2022-06-22 NOTE — Procedures (Signed)
EMG & NCV Findings: Evaluation of the right median motor nerve showed prolonged distal onset latency (7.0 ms) and decreased conduction velocity (Elbow-Wrist, 43 m/s).  The right median (across palm) sensory nerve showed prolonged distal peak latency (Wrist, 6.3 ms), reduced amplitude (5.8 V), and prolonged distal peak latency (Palm, 8.4 ms).  All remaining nerves (as indicated in the following tables) were within normal limits.    All examined muscles (as indicated in the following table) showed no evidence of electrical instability.    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 The lesion is characterized by sensory and motor demyelination without significant evidence of axonal injury.   There is no significant electrodiagnostic evidence of any other focal nerve entrapment, brachial plexopathy or cervical radiculopathy.   Recommendations: 1.  Follow-up with referring physician. 2.  Continue current management of symptoms. 3.  Suggest surgical evaluation.  ___________________________ Laurence Spates FAAPMR Board Certified, American Board of Physical Medicine and Rehabilitation    Nerve Conduction Studies Anti Sensory Summary Table   Stim Site NR Peak (ms) Norm Peak (ms) P-T Amp (V) Norm P-T Amp Site1 Site2 Delta-P (ms) Dist (cm) Vel (m/s) Norm Vel (m/s)  Right Median Acr Palm Anti Sensory (2nd Digit)  30.9C  Wrist    *6.3 <3.6 *5.8 >10 Wrist Palm 2.1 0.0    Palm    *8.4 <2.0 6.7         Right Radial Anti Sensory (Base 1st Digit)  31.3C  Wrist    2.0 <3.1 30.9  Wrist Base 1st Digit 2.0 0.0    Right Ulnar Anti Sensory (5th Digit)  31.2C  Wrist    2.7 <3.7 25.2 >15.0 Wrist 5th Digit 2.7 14.0 52 >38   Motor Summary Table   Stim Site NR Onset (ms) Norm Onset (ms) O-P Amp (mV) Norm O-P Amp Site1 Site2 Delta-0 (ms) Dist  (cm) Vel (m/s) Norm Vel (m/s)  Right Median Motor (Abd Poll Brev)  31.4C  Wrist    *7.0 <4.2 5.4 >5 Elbow Wrist 3.6 15.3 *43 >50  Elbow    10.6  5.1         Right Ulnar Motor (Abd Dig Min)  31.6C  Wrist    2.6 <4.2 8.7 >3 B Elbow Wrist 2.2 15.0 68 >53  B Elbow    4.8  9.1  A Elbow B Elbow 1.3 10.0 77 >53  A Elbow    6.1  9.0          EMG   Side Muscle Nerve Root Ins Act Fibs Psw Amp Dur Poly Recrt Int Fraser Din Comment  Right Abd Poll Brev Median C8-T1 Nml Nml Nml Nml Nml 0 Nml Nml   Right 1stDorInt Ulnar C8-T1 Nml Nml Nml Nml Nml 0 Nml Nml   Right PronatorTeres Median C6-7 Nml Nml Nml Nml Nml 0 Nml Nml   Right Biceps Musculocut C5-6 Nml Nml Nml Nml Nml 0 Nml Nml   Right Deltoid Axillary C5-6 Nml Nml Nml Nml Nml 0 Nml Nml     Nerve Conduction Studies Anti Sensory Left/Right Comparison   Stim Site L Lat (ms) R Lat (ms) L-R Lat (ms) L Amp (V) R Amp (V) L-R Amp (%) Site1 Site2 L Vel (m/s) R Vel (m/s) L-R Vel (m/s)  Median Acr Palm Anti Sensory (2nd Digit)  30.9C  Wrist  *6.3   *5.8  Wrist Palm     Palm  *8.4   6.7        Radial Anti Sensory (Base 1st Digit)  31.3C  Wrist  2.0   30.9  Wrist Base 1st Digit     Ulnar Anti Sensory (5th Digit)  31.2C  Wrist  2.7   25.2  Wrist 5th Digit  52    Motor Left/Right Comparison   Stim Site L Lat (ms) R Lat (ms) L-R Lat (ms) L Amp (mV) R Amp (mV) L-R Amp (%) Site1 Site2 L Vel (m/s) R Vel (m/s) L-R Vel (m/s)  Median Motor (Abd Poll Brev)  31.4C  Wrist  *7.0   5.4  Elbow Wrist  *43   Elbow  10.6   5.1        Ulnar Motor (Abd Dig Min)  31.6C  Wrist  2.6   8.7  B Elbow Wrist  68   B Elbow  4.8   9.1  A Elbow B Elbow  77   A Elbow  6.1   9.0           Waveforms:

## 2022-06-23 NOTE — Addendum Note (Signed)
Addended by: Raymondo Band on: 06/23/2022 10:33 AM   Modules accepted: Orders

## 2022-06-24 ENCOUNTER — Ambulatory Visit (HOSPITAL_COMMUNITY)
Admission: RE | Admit: 2022-06-24 | Discharge: 2022-06-24 | Disposition: A | Discharge status: Home / Self Care | Payer: PRIVATE HEALTH INSURANCE | Source: Ambulatory Visit | Attending: Physician Assistant | Admitting: Physician Assistant

## 2022-06-24 DIAGNOSIS — G8929 Other chronic pain: Secondary | ICD-10-CM | POA: Diagnosis present

## 2022-06-24 DIAGNOSIS — M5442 Lumbago with sciatica, left side: Secondary | ICD-10-CM | POA: Insufficient documentation

## 2022-06-29 ENCOUNTER — Encounter: Payer: Self-pay | Admitting: Orthopaedic Surgery

## 2022-06-29 ENCOUNTER — Ambulatory Visit (INDEPENDENT_AMBULATORY_CARE_PROVIDER_SITE_OTHER): Payer: PRIVATE HEALTH INSURANCE | Admitting: Orthopaedic Surgery

## 2022-06-29 DIAGNOSIS — M5442 Lumbago with sciatica, left side: Secondary | ICD-10-CM

## 2022-06-29 DIAGNOSIS — G8929 Other chronic pain: Secondary | ICD-10-CM | POA: Diagnosis not present

## 2022-06-29 NOTE — Progress Notes (Signed)
Office Visit Note   Patient: Amy Mcintyre           Date of Birth: October 22, 1966           MRN: [id number] Visit Date: 06/29/2022              Requested by: Girtha Rm, NP-C [address] [place],  [place] [address] PCP: Girtha Rm, NP-C  Chief Complaint  Patient presents with   Lower Back - Follow-up      HPI: Amy Mcintyre is a pleasant 56 year old woman who is following up for her low back pain today.  She is over 3 months status post slipping on her ID badge and extending her left leg.  Since then she has had low back pain focused in the low back and pain also that goes down from her posterior left leg.  She has tried physical therapy but her most recent physical therapy session made it worse.  She has no loss of bowel or bladder function.  She is also tried anti-inflammatories which have provided some relief.  Because of the persistence of her symptoms though she is getting slightly better an MRI was ordered.  She is here to review this today  Assessment & Plan: Visit Diagnoses:  1. Chronic left-sided low back pain with left-sided sciatica     Plan: MRI was reviewed with her today.  She does have some facet arthropathy at Y6-9 and S8-N4 certainly this can account for some of her back pain.  The pain in her left leg may be related to just muscular from the stretch on her leg.  We will arrange for referral to Dr. Ernestina Patches for facet injections to see if this helps relieve her pain.  Follow-Up Instructions: After injections  Ortho Exam  Patient is alert, oriented, no adenopathy, well-dressed, normal affect, normal respiratory effort. Examination of her low back strength is 5 out of 5 sensation is intact.  She does have pain focally in the lower mid back.  There is no redness or erythema associated with this.  Straight leg raise is negative.  She does have some radiation of symptoms down her left posterior buttock down to the level of her knee.  Nothing beyond.  Imaging: No  results found. No images are attached to the encounter.  Labs: Lab Results  Component Value Date   HGBA1C 6.2 05/05/2022   ESRSEDRATE 7 09/25/2018   CRP 0.2 (L) 09/25/2018     Lab Results  Component Value Date   ALBUMIN 4.6 05/05/2022   ALBUMIN 4.4 01/05/2022   ALBUMIN 4.7 12/13/2018    No results found for: "MG" Lab Results  Component Value Date   VD25OH 65.10 05/05/2022    No results found for: "PREALBUMIN"    Latest Ref Rng & Units 05/05/2022    8:55 AM 01/05/2022    9:38 AM 12/13/2018   11:58 AM  CBC EXTENDED  WBC 4.0 - 10.5 K/uL 5.4  4.7  4.6   RBC 3.87 - 5.11 Mil/uL 4.36  4.12  4.47   Hemoglobin 12.0 - 15.0 g/dL 13.2  12.5  13.5   HCT 36.0 - 46.0 % 39.6  37.8  40.6   Platelets 150.0 - 400.0 K/uL 212.0  204.0  258.0   NEUT# 1.4 - 7.7 K/uL 2.9  2.7  2.8   Lymph# 0.7 - 4.0 K/uL 1.9  1.5  1.4      There is no height or weight on file to calculate BMI.  Orders:  Orders Placed This Encounter  Procedures   Ambulatory referral to Physical Medicine Rehab   No orders of the defined types were placed in this encounter.    Procedures: No procedures performed  Clinical Data: No additional findings.  ROS:  All other systems negative, except as noted in the HPI. Review of Systems  All other systems reviewed and are negative.   Objective: Vital Signs: There were no vitals taken for this visit.  Specialty Comments:  No specialty comments available.  PMFS History: Patient Active Problem List   Diagnosis Date Noted   Vitamin D deficiency 05/06/2022   Right hand paresthesia 05/05/2022   Insulin resistance 05/05/2022   Elevated liver enzymes 05/05/2022   Allergic rhinitis 05/05/2022   Alopecia 05/05/2022   Carpal tunnel syndrome of right wrist 05/05/2022   Chronic myofascial pain 05/05/2022   Diarrhea 05/05/2022   Fatigue 05/05/2022   Major depression 05/05/2022   High risk medication use 05/05/2022   Prediabetes 05/05/2022   Pericapsulitis of  shoulder 05/05/2022   Palpitation 05/05/2022   Rash 05/05/2022   Vitamin D toxicity 05/05/2022   Polyarthropathy 05/05/2022   BMI 31.0-31.9,adult 05/05/2022   Menopausal state 05/05/2022   Low back pain 05/05/2022   Fatty liver 05/05/2022   Chronic diarrhea 05/05/2022   Tubular adenoma 01/29/2022   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   Gastric reflux 01/29/2022   Insomnia 09/22/2018   Severe recurrent major depression without psychotic features ([facility] [place]) 09/11/2018   Generalized anxiety disorder 09/11/2018   Hypothyroidism 08/17/2018   Seasonal allergies 05/22/2012   Premenstrual dysphoric disorder 05/22/2012   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   Hypercholesterolemia 02/13/2004   Past Medical History:  Diagnosis Date   Allergy    Anxiety    Cataracts, bilateral    Colitis    Depression    Eosinophilic esophagitis    Fatty liver    Gastritis    GERD (gastroesophageal reflux disease)    Hyperlipidemia    Thyroid disease    TMJ (dislocation of temporomandibular joint)     Family History  Problem Relation Age of Onset   Anxiety disorder Mother    Panic disorder Mother    OCD Mother    Depression Mother    Arthritis Mother    High Cholesterol Mother    Anxiety disorder Father    Depression Father    Colon polyps Father    Heart disease Father    High Cholesterol Father    High blood pressure Father    Drug abuse Brother    Asthma Brother    COPD Brother    Depression Brother    Arthritis Maternal Grandmother    Depression Maternal Grandfather    Depression Paternal Grandmother    Alcohol abuse Paternal Grandfather    Heart disease Paternal Grandfather    Colon cancer Neg Hx    Esophageal cancer Neg Hx    Pancreatic cancer Neg Hx    Stomach cancer Neg Hx    Liver disease Neg Hx    Rectal cancer Neg Hx     Past Surgical History:  Procedure Laterality Date   BREAST ENHANCEMENT SURGERY     CESAREAN SECTION     NASAL SINUS SURGERY     tummy tuck     WRIST  SURGERY Left    Social History   Occupational History   Occupation: MANAGER  Tobacco Use   Smoking status: Never    Passive exposure: Never   Smokeless tobacco: Never  Vaping  Use   Vaping Use: Never used  Substance and Sexual Activity   Alcohol use: Yes    Comment: socially   Drug use: Never   Sexual activity: Not Currently

## 2022-07-01 ENCOUNTER — Encounter: Payer: Self-pay | Admitting: Family Medicine

## 2022-07-01 ENCOUNTER — Encounter: Payer: Self-pay | Admitting: Orthopaedic Surgery

## 2022-07-01 ENCOUNTER — Ambulatory Visit (INDEPENDENT_AMBULATORY_CARE_PROVIDER_SITE_OTHER): Payer: PRIVATE HEALTH INSURANCE | Admitting: Orthopedic Surgery

## 2022-07-01 DIAGNOSIS — G5601 Carpal tunnel syndrome, right upper limb: Secondary | ICD-10-CM

## 2022-07-01 DIAGNOSIS — Z1239 Encounter for other screening for malignant neoplasm of breast: Secondary | ICD-10-CM

## 2022-07-01 NOTE — Progress Notes (Signed)
 Office Visit Note   Patient: Amy Mcintyre           Date of Birth: 07/05/1966           MRN: [id number] Visit Date: 07/01/2022              Requested by: Henson, Vickie L, NP-C [address] [place],  [place] [address] PCP: Henson, Vickie L, NP-C   Assessment & Plan: Visit Diagnoses:  1. Carpal tunnel syndrome of right wrist     Plan: Patient presents with continued numbness and paresthesias along the right hand and for review of her recent EMG/nerve conduction study.  The study on 06/16/2022 suggest moderate to severe carpal tunnel syndrome.  We again reviewed the nature of carpal tunnel syndrome as well as both different conservative and surgical treatment management options.  We reviewed the risks of surgery including bleeding, infection, damage to the median nerve or its branches, incomplete symptom relief, need for additional procedures.  After discussion, she would like to proceed with carpal tunnel release.  A surgical date and time will be confirmed with the patient.  Follow-Up Instructions: No follow-ups on file.   Orders:  No orders of the defined types were placed in this encounter.  No orders of the defined types were placed in this encounter.     Procedures: No procedures performed   Clinical Data: No additional findings.   Subjective: Chief Complaint  Patient presents with   Right Wrist - Follow-up    This is a 55-year-old right-hand-dominant female who presents for with continued numbness and paresthesias involving the right hand.  This been going on for many months now.  She continues to describe numbness in all fingers except for the small finger.  She has both daytime and nocturnal symptoms.  She is worn bracing at night which has not helped much.  She has difficulty with daily tasks including writing and typing.  She is interested in discussing surgical management today.    Review of Systems   Objective: Vital Signs: There were no vitals taken  for this visit.  Physical Exam Constitutional:      Appearance: Normal appearance.  Cardiovascular:     Rate and Rhythm: Normal rate.  Pulmonary:     Effort: Pulmonary effort is normal.  Skin:    General: Skin is warm and dry.     Capillary Refill: Capillary refill takes less than 2 seconds.  Neurological:     Mental Status: She is alert.     Right Hand Exam   Tenderness  The patient is experiencing no tenderness.   Other  Erythema: absent Sensation: normal Pulse: present  Comments:  Negative Tinel sign.  Positive Phalen and Durkan signs.  5/5 thenar motor strength.       Specialty Comments:  No specialty comments available.  Imaging: No results found.   PMFS History: Patient Active Problem List   Diagnosis Date Noted   Vitamin D deficiency 05/06/2022   Right hand paresthesia 05/05/2022   Insulin resistance 05/05/2022   Elevated liver enzymes 05/05/2022   Allergic rhinitis 05/05/2022   Alopecia 05/05/2022   Carpal tunnel syndrome of right wrist 05/05/2022   Chronic myofascial pain 05/05/2022   Diarrhea 05/05/2022   Fatigue 05/05/2022   Major depression 05/05/2022   High risk medication use 05/05/2022   Prediabetes 05/05/2022   Pericapsulitis of shoulder 05/05/2022   Palpitation 05/05/2022   Rash 05/05/2022   Vitamin D toxicity 05/05/2022   Polyarthropathy 05/05/2022     BMI 31.0-31.9,adult 05/05/2022   Menopausal state 05/05/2022   Low back pain 05/05/2022   Fatty liver 05/05/2022   Chronic diarrhea 05/05/2022   Tubular adenoma 01/29/2022   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   Gastric reflux 01/29/2022   Insomnia 09/22/2018   Severe recurrent major depression without psychotic features ([facility]) 09/11/2018   Generalized anxiety disorder 09/11/2018   Hypothyroidism 08/17/2018   Seasonal allergies 05/22/2012   Premenstrual dysphoric disorder 05/22/2012   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   Hypercholesterolemia 02/13/2004   Past Medical History:   Diagnosis Date   Allergy    Anxiety    Cataracts, bilateral    Colitis    Depression    Eosinophilic esophagitis    Fatty liver    Gastritis    GERD (gastroesophageal reflux disease)    Hyperlipidemia    Thyroid disease    TMJ (dislocation of temporomandibular joint)     Family History  Problem Relation Age of Onset   Anxiety disorder Mother    Panic disorder Mother    OCD Mother    Depression Mother    Arthritis Mother    High Cholesterol Mother    Anxiety disorder Father    Depression Father    Colon polyps Father    Heart disease Father    High Cholesterol Father    High blood pressure Father    Drug abuse Brother    Asthma Brother    COPD Brother    Depression Brother    Arthritis Maternal Grandmother    Depression Maternal Grandfather    Depression Paternal Grandmother    Alcohol abuse Paternal Grandfather    Heart disease Paternal Grandfather    Colon cancer Neg Hx    Esophageal cancer Neg Hx    Pancreatic cancer Neg Hx    Stomach cancer Neg Hx    Liver disease Neg Hx    Rectal cancer Neg Hx     Past Surgical History:  Procedure Laterality Date   BREAST ENHANCEMENT SURGERY     CESAREAN SECTION     NASAL SINUS SURGERY     tummy tuck     WRIST SURGERY Left    Social History   Occupational History   Occupation: MANAGER  Tobacco Use   Smoking status: Never    Passive exposure: Never   Smokeless tobacco: Never  Vaping Use   Vaping Use: Never used  Substance and Sexual Activity   Alcohol use: Yes    Comment: socially   Drug use: Never   Sexual activity: Not Currently        

## 2022-07-01 NOTE — H&P (View-Only) (Signed)
Office Visit Note   Patient: Amy Mcintyre           Date of Birth: [birth year]           MRN: [id number] Visit Date: 07/01/2022              Requested by: Girtha Rm, NP-C [place],  [place] [address] PCP: Girtha Rm, NP-C   Assessment & Plan: Visit Diagnoses:  1. Carpal tunnel syndrome of right wrist     Plan: Patient presents with continued numbness and paresthesias along the right hand and for review of her recent EMG/nerve conduction study.  The study on 06/16/2022 suggest moderate to severe carpal tunnel syndrome.  We again reviewed the nature of carpal tunnel syndrome as well as both different conservative and surgical treatment management options.  We reviewed the risks of surgery including bleeding, infection, damage to the median nerve or its branches, incomplete symptom relief, need for additional procedures.  After discussion, she would like to proceed with carpal tunnel release.  A surgical date and time will be confirmed with the patient.  Follow-Up Instructions: No follow-ups on file.   Orders:  No orders of the defined types were placed in this encounter.  No orders of the defined types were placed in this encounter.     Procedures: No procedures performed   Clinical Data: No additional findings.   Subjective: Chief Complaint  Patient presents with   Right Wrist - Follow-up    This is a 56 year old right-hand-dominant female who presents for with continued numbness and paresthesias involving the right hand.  This been going on for many months now.  She continues to describe numbness in all fingers except for the small finger.  She has both daytime and nocturnal symptoms.  She is worn bracing at night which has not helped much.  She has difficulty with daily tasks including writing and typing.  She is interested in discussing surgical management today.    Review of Systems   Objective: Vital Signs: There were no vitals taken  for this visit.  Physical Exam Constitutional:      Appearance: Normal appearance.  Cardiovascular:     Rate and Rhythm: Normal rate.  Pulmonary:     Effort: Pulmonary effort is normal.  Skin:    General: Skin is warm and dry.     Capillary Refill: Capillary refill takes less than 2 seconds.  Neurological:     Mental Status: She is alert.     Right Hand Exam   Tenderness  The patient is experiencing no tenderness.   Other  Erythema: absent Sensation: normal Pulse: present  Comments:  Negative Tinel sign.  Positive Phalen and Durkan signs.  5/5 thenar motor strength.       Specialty Comments:  No specialty comments available.  Imaging: No results found.   PMFS History: Patient Active Problem List   Diagnosis Date Noted   Vitamin D deficiency 05/06/2022   Right hand paresthesia 05/05/2022   Insulin resistance 05/05/2022   Elevated liver enzymes 05/05/2022   Allergic rhinitis 05/05/2022   Alopecia 05/05/2022   Carpal tunnel syndrome of right wrist 05/05/2022   Chronic myofascial pain 05/05/2022   Diarrhea 05/05/2022   Fatigue 05/05/2022   Major depression 05/05/2022   High risk medication use 05/05/2022   Prediabetes 05/05/2022   Pericapsulitis of shoulder 05/05/2022   Palpitation 05/05/2022   Rash 05/05/2022   Vitamin D toxicity 05/05/2022   Polyarthropathy 05/05/2022  BMI 31.0-31.9,adult 05/05/2022   Menopausal state 05/05/2022   Low back pain 05/05/2022   Fatty liver 05/05/2022   Chronic diarrhea 05/05/2022   Tubular adenoma 01/29/2022   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   Gastric reflux 01/29/2022   Insomnia 09/22/2018   Severe recurrent major depression without psychotic features ([place]) 09/11/2018   Generalized anxiety disorder 09/11/2018   Hypothyroidism 08/17/2018   Seasonal allergies 05/22/2012   Premenstrual dysphoric disorder 05/22/2012   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   Hypercholesterolemia 02/13/2004   Past Medical History:   Diagnosis Date   Allergy    Anxiety    Cataracts, bilateral    Colitis    Depression    Eosinophilic esophagitis    Fatty liver    Gastritis    GERD (gastroesophageal reflux disease)    Hyperlipidemia    Thyroid disease    TMJ (dislocation of temporomandibular joint)     Family History  Problem Relation Age of Onset   Anxiety disorder Mother    Panic disorder Mother    OCD Mother    Depression Mother    Arthritis Mother    High Cholesterol Mother    Anxiety disorder Father    Depression Father    Colon polyps Father    Heart disease Father    High Cholesterol Father    High blood pressure Father    Drug abuse Brother    Asthma Brother    COPD Brother    Depression Brother    Arthritis Maternal Grandmother    Depression Maternal Grandfather    Depression Paternal Grandmother    Alcohol abuse Paternal Grandfather    Heart disease Paternal Grandfather    Colon cancer Neg Hx    Esophageal cancer Neg Hx    Pancreatic cancer Neg Hx    Stomach cancer Neg Hx    Liver disease Neg Hx    Rectal cancer Neg Hx     Past Surgical History:  Procedure Laterality Date   BREAST ENHANCEMENT SURGERY     CESAREAN SECTION     NASAL SINUS SURGERY     tummy tuck     WRIST SURGERY Left    Social History   Occupational History   Occupation: MANAGER  Tobacco Use   Smoking status: Never    Passive exposure: Never   Smokeless tobacco: Never  Vaping Use   Vaping Use: Never used  Substance and Sexual Activity   Alcohol use: Yes    Comment: socially   Drug use: Never   Sexual activity: Not Currently

## 2022-07-06 NOTE — Telephone Encounter (Signed)
Pt was aware by Amy Mcintyre on yesterday.Marland KitchenJohny Mcintyre

## 2022-07-08 ENCOUNTER — Encounter: Payer: Self-pay | Admitting: Physical Medicine and Rehabilitation

## 2022-07-09 ENCOUNTER — Other Ambulatory Visit: Payer: Self-pay

## 2022-07-09 ENCOUNTER — Encounter (HOSPITAL_COMMUNITY): Payer: Self-pay | Admitting: Orthopedic Surgery

## 2022-07-09 MED ORDER — LEVOTHYROXINE SODIUM 75 MCG PO TABS: 75.0000 ug | ORAL_TABLET | Freq: Every day | ORAL | 2 refills | Status: DC

## 2022-07-09 NOTE — Telephone Encounter (Signed)
Rx sent. Orders placed.  

## 2022-07-09 NOTE — Progress Notes (Signed)
SDW CALL  Patient was given pre-op instructions over the phone. The opportunity was given for the patient to ask questions. No further questions asked. Patient verbalized understanding of instructions given.   PCP - Harland Dingwall NP Cardiologist - denies   Chest x-ray - n/a EKG - n/a Stress Test - denies  ECHO - denies Cardiac Cath - denies  Sleep Study - 2023, does not tolerate CPAP CPAP -   Blood Thinner Instructions: no NSAIDS Aspirin Instructions:  ERAS Protcol - clears until 1415 PRE-SURGERY Ensure or G2-   COVID TEST- n/a   Anesthesia review: NO  Patient denies shortness of breath, fever, cough and chest pain over the phone call   All instructions explained to the patient, with a verbal understanding of the material. Patient agrees to go over the instructions while at home for a better understanding.

## 2022-07-09 NOTE — Telephone Encounter (Signed)
Will let pt know we can schedule mammogram at [facility].. pt has lost her thyroid medication, ok to refill?

## 2022-07-12 ENCOUNTER — Ambulatory Visit (HOSPITAL_BASED_OUTPATIENT_CLINIC_OR_DEPARTMENT_OTHER): Payer: PRIVATE HEALTH INSURANCE | Admitting: Certified Registered"

## 2022-07-12 ENCOUNTER — Ambulatory Visit (HOSPITAL_COMMUNITY): Payer: PRIVATE HEALTH INSURANCE | Admitting: Certified Registered"

## 2022-07-12 ENCOUNTER — Encounter (HOSPITAL_COMMUNITY): Payer: Self-pay | Admitting: Orthopedic Surgery

## 2022-07-12 ENCOUNTER — Ambulatory Visit (HOSPITAL_COMMUNITY)
Admission: RE | Admit: 2022-07-12 | Discharge: 2022-07-12 | Disposition: A | Discharge status: Home / Self Care | Payer: PRIVATE HEALTH INSURANCE | Attending: Orthopedic Surgery | Admitting: Orthopedic Surgery

## 2022-07-12 ENCOUNTER — Other Ambulatory Visit: Payer: Self-pay

## 2022-07-12 ENCOUNTER — Encounter (HOSPITAL_COMMUNITY)
Admission: RE | Disposition: A | Discharge status: Home / Self Care | Payer: Self-pay | Source: Home / Self Care | Attending: Orthopedic Surgery

## 2022-07-12 DIAGNOSIS — E039 Hypothyroidism, unspecified: Secondary | ICD-10-CM | POA: Diagnosis not present

## 2022-07-12 DIAGNOSIS — F418 Other specified anxiety disorders: Secondary | ICD-10-CM

## 2022-07-12 DIAGNOSIS — G4730 Sleep apnea, unspecified: Secondary | ICD-10-CM

## 2022-07-12 DIAGNOSIS — G5601 Carpal tunnel syndrome, right upper limb: Secondary | ICD-10-CM

## 2022-07-12 HISTORY — DX: Prediabetes: R73.03

## 2022-07-12 HISTORY — DX: Sleep apnea, unspecified: G47.30

## 2022-07-12 HISTORY — PX: CARPAL TUNNEL RELEASE: SHX101

## 2022-07-12 LAB — CBC
HCT: 36.9 % (ref 36.0–46.0)
Hemoglobin: 12.1 g/dL (ref 12.0–15.0)
MCH: 30.6 pg (ref 26.0–34.0)
MCHC: 32.8 g/dL (ref 30.0–36.0)
MCV: 93.4 fL (ref 80.0–100.0)
Platelets: 190 10*3/uL (ref 150–400)
RBC: 3.95 MIL/uL (ref 3.87–5.11)
RDW: 12.9 % (ref 11.5–15.5)
WBC: 4.6 10*3/uL (ref 4.0–10.5)
nRBC: 0 % (ref 0.0–0.2)

## 2022-07-12 SURGERY — CARPAL TUNNEL RELEASE
Anesthesia: Monitor Anesthesia Care | Site: Hand | Laterality: Right

## 2022-07-12 MED ORDER — 0.9 % SODIUM CHLORIDE (POUR BTL) OPTIME
TOPICAL | Status: DC | PRN
  Administered 2022-07-12: 1000 mL

## 2022-07-12 MED ORDER — CHLORHEXIDINE GLUCONATE 0.12 % MT SOLN: 15.0000 mL | Freq: Once | OROMUCOSAL | Status: AC

## 2022-07-12 MED ORDER — PROPOFOL 10 MG/ML IV BOLUS
INTRAVENOUS | Status: DC | PRN
  Administered 2022-07-12 (×2): 20 mg via INTRAVENOUS

## 2022-07-12 MED ORDER — BUPIVACAINE HCL (PF) 0.25 % IJ SOLN
INTRAMUSCULAR | Status: AC
Start: 2022-07-12 — End: ?
  Filled 2022-07-12: qty 30

## 2022-07-12 MED ORDER — CHLORHEXIDINE GLUCONATE 0.12 % MT SOLN
OROMUCOSAL | Status: AC
  Administered 2022-07-12: 15 mL via OROMUCOSAL
  Filled 2022-07-12: qty 15

## 2022-07-12 MED ORDER — FENTANYL CITRATE (PF) 100 MCG/2ML IJ SOLN: 25.0000 ug | INTRAMUSCULAR | Status: DC | PRN

## 2022-07-12 MED ORDER — ROCURONIUM BROMIDE 10 MG/ML (PF) SYRINGE
PREFILLED_SYRINGE | INTRAVENOUS | Status: AC
Start: 2022-07-12 — End: ?
  Filled 2022-07-12: qty 10

## 2022-07-12 MED ORDER — ONDANSETRON HCL 4 MG/2ML IJ SOLN: 4.0000 mg | Freq: Four times a day (QID) | INTRAMUSCULAR | Status: DC | PRN

## 2022-07-12 MED ORDER — ORAL CARE MOUTH RINSE: 15.0000 mL | Freq: Once | OROMUCOSAL | Status: AC

## 2022-07-12 MED ORDER — BUPIVACAINE HCL (PF) 0.25 % IJ SOLN
INTRAMUSCULAR | Status: DC | PRN
  Administered 2022-07-12: 10 mL

## 2022-07-12 MED ORDER — FENTANYL CITRATE (PF) 250 MCG/5ML IJ SOLN
INTRAMUSCULAR | Status: AC
  Filled 2022-07-12: qty 5

## 2022-07-12 MED ORDER — PROPOFOL 500 MG/50ML IV EMUL
INTRAVENOUS | Status: DC | PRN
  Administered 2022-07-12: 150 ug/kg/min via INTRAVENOUS

## 2022-07-12 MED ORDER — MIDAZOLAM HCL 2 MG/2ML IJ SOLN
INTRAMUSCULAR | Status: AC
  Filled 2022-07-12: qty 2

## 2022-07-12 MED ORDER — ONDANSETRON HCL 4 MG/2ML IJ SOLN
INTRAMUSCULAR | Status: DC | PRN
  Administered 2022-07-12: 4 mg via INTRAVENOUS

## 2022-07-12 MED ORDER — LACTATED RINGERS IV SOLN: INTRAVENOUS | Status: DC

## 2022-07-12 MED ORDER — PHENYLEPHRINE 80 MCG/ML (10ML) SYRINGE FOR IV PUSH (FOR BLOOD PRESSURE SUPPORT)
PREFILLED_SYRINGE | INTRAVENOUS | Status: AC
  Filled 2022-07-12: qty 20

## 2022-07-12 MED ORDER — OXYCODONE HCL 5 MG/5ML PO SOLN: 5.0000 mg | Freq: Once | ORAL | Status: DC | PRN

## 2022-07-12 MED ORDER — OXYCODONE HCL 5 MG PO TABS: 5.0000 mg | ORAL_TABLET | Freq: Once | ORAL | Status: DC | PRN

## 2022-07-12 MED ORDER — LIDOCAINE 2% (20 MG/ML) 5 ML SYRINGE
INTRAMUSCULAR | Status: AC
  Filled 2022-07-12: qty 5

## 2022-07-12 MED ORDER — FENTANYL CITRATE (PF) 250 MCG/5ML IJ SOLN
INTRAMUSCULAR | Status: DC | PRN
  Administered 2022-07-12 (×2): 25 ug via INTRAVENOUS

## 2022-07-12 SURGICAL SUPPLY — 35 items
BLADE SURG 15 STRL LF DISP TIS (BLADE) ×1 IMPLANT
BLADE SURG 15 STRL SS (BLADE) ×1
BNDG ELASTIC 3X5.8 VLCR STR LF (GAUZE/BANDAGES/DRESSINGS) ×2 IMPLANT
BNDG ELASTIC 4X5.8 VLCR STR LF (GAUZE/BANDAGES/DRESSINGS) ×1 IMPLANT
BNDG ESMARK 4X9 LF (GAUZE/BANDAGES/DRESSINGS) ×2 IMPLANT
BNDG GAUZE DERMACEA FLUFF (GAUZE/BANDAGES/DRESSINGS) ×1
BNDG GAUZE DERMACEA FLUFF 4 (GAUZE/BANDAGES/DRESSINGS) ×1 IMPLANT
CHLORAPREP W/TINT 26 (MISCELLANEOUS) ×2 IMPLANT
CUFF TOURN SGL QUICK 18X4 (TOURNIQUET CUFF) IMPLANT
CUFF TOURN SGL QUICK 24 (TOURNIQUET CUFF)
CUFF TRNQT CYL 24X4X16.5-23 (TOURNIQUET CUFF) IMPLANT
DRAPE SURG 17X23 STRL (DRAPES) ×2 IMPLANT
GAUZE XEROFORM 1X8 LF (GAUZE/BANDAGES/DRESSINGS) ×1 IMPLANT
GLOVE BIO SURGEON STRL SZ7 (GLOVE) ×2 IMPLANT
GLOVE BIOGEL PI IND STRL 7.0 (GLOVE) ×1 IMPLANT
GLOVE BIOGEL PI INDICATOR 7.0 (GLOVE) ×1
GOWN STRL REUS W/ TWL LRG LVL3 (GOWN DISPOSABLE) ×1 IMPLANT
GOWN STRL REUS W/TWL LRG LVL3 (GOWN DISPOSABLE) ×1
GOWN STRL REUS W/TWL XL LVL3 (GOWN DISPOSABLE) ×2 IMPLANT
KIT BASIN OR (CUSTOM PROCEDURE TRAY) ×2 IMPLANT
NDL HYPO 25X1 1.5 SAFETY (NEEDLE) IMPLANT
NEEDLE HYPO 25X1 1.5 SAFETY (NEEDLE) ×2 IMPLANT
NS IRRIG 1000ML POUR BTL (IV SOLUTION) ×2 IMPLANT
PACK ORTHO EXTREMITY (CUSTOM PROCEDURE TRAY) ×2 IMPLANT
PAD CAST 3X4 CTTN HI CHSV (CAST SUPPLIES) ×1 IMPLANT
PADDING CAST COTTON 3X4 STRL (CAST SUPPLIES) ×1
SLEEVE SCD COMPRESS KNEE MED (STOCKING) IMPLANT
SUT ETHILON 4 0 PS 2 18 (SUTURE) ×2 IMPLANT
SUT MNCRL AB 3-0 PS2 18 (SUTURE) IMPLANT
SUT VICRYL 4-0 PS2 18IN ABS (SUTURE) IMPLANT
SYR BULB EAR ULCER 3OZ GRN STR (SYRINGE) ×2 IMPLANT
SYR CONTROL 10ML LL (SYRINGE) ×1 IMPLANT
TOWEL GREEN STERILE FF (TOWEL DISPOSABLE) ×4 IMPLANT
TUBE CONNECTING 20X1/4 (TUBING) IMPLANT
UNDERPAD 30X36 HEAVY ABSORB (UNDERPADS AND DIAPERS) ×2 IMPLANT

## 2022-07-12 NOTE — Op Note (Signed)
   Date of Surgery: 07/12/2022  INDICATIONS: Patient is a 56 y.o.-year-old female with right carpal tunnel syndrome that has failed conservative management and was confirmed on electrodiagnostic studies.  Risks, benefits, and alternatives to surgery were again discussed with the patient in the preoperative area. The patient wishes to proceed with surgery.  Informed consent was signed after our discussion.   PREOPERATIVE DIAGNOSIS:  Right carpal tunnel syndrome  POSTOPERATIVE DIAGNOSIS: Same.  PROCEDURE:  Right carpal tunnel release   SURGEON: Audria Nine, M.D.  ASSIST:   ANESTHESIA:  Local, MAC  IV FLUIDS AND URINE: See anesthesia.  ESTIMATED BLOOD LOSS: <5 mL.  IMPLANTS: * No implants in log *   DRAINS: None  COMPLICATIONS: None  DESCRIPTION OF PROCEDURE: The patient was met in the preoperative holding area where the surgical site was marked and the consent form was verified.  The patient was then taken to the operating room and transferred to the operating table.  All bony prominences were well padded.  A tourniquet was applied to the right forearm.  Monitored sedation was induced.   A formal time-out was performed to confirm that this was the correct patient, surgery, side, and site. A local block was performed using 10cc of 0.25% plain marcaine. The operative extremity was prepped and draped in the usual and sterile fashion.    Following a second timeout, the limb was exsanguinated and the tourniquet inflated to 250 mmHg.  A longitudinal incision was made in line with the radial border of the ring finger from distal to the wrist flexion crease to the intersection of Kaplan's cardinal line.  The skin and subcutaneous tissue was sharply divided.  The longitudinally running palmar fascia was incised.  The thenar musculature was bluntly swept off of the transverse carpal ligament.  The ligament was divided from proximal to distal until the fat surrounding the palmar arch was  encountered. Retractors were then placed in the proximal aspect of the wound to visualize the distal antebrachial fascia.  The fascia was sharply divided under direct visualization.   The wound was then thoroughly irrigated with sterile saline.  The tourniquet was deflated.  Hemostasis was achieved with direct pressure  The wound was then closed with 4-0 nylon sutures in a horizontal mattress fashion. The wound was then dressed with xeroform, folded kerlix, and an ace wrap.  The patient was then reversed from anesthesia and transferred to the postoperative bed.  All counts were correct x 2 at the end of the procedure.  The patient was taken to the recovery unit in stable condition.     POSTOPERATIVE PLAN: She will be discharged to home with appropriate pain medication and discharge instructions.  I'll see her in the office in 10-14 days for her first postop visit.   Audria Nine, MD 2:02 PM

## 2022-07-12 NOTE — Discharge Instructions (Signed)
Amy Mcintyre, M.D. Hand Surgery  POST-OPERATIVE DISCHARGE INSTRUCTIONS   PRESCRIPTIONS: - You have been given a prescription to be taken as directed for post-operative pain control.  You may also take over the counter ibuprofen/aleve and tylenol for pain. Take this as directed on the packaging. Do not exceed 3000 mg tylenol/acetaminophen in 24 hours.  Ibuprofen 600-800 mg (3-4) tablets by mouth every 6 hours as needed for pain.   OR  Aleve 2 tablets by mouth every 12 hours (twice daily) as needed for pain.   AND/OR  Tylenol 1000 mg (2 tablets) every 8 hours as needed for pain.  - Please use your pain medication carefully, as refills are limited and you may not be provided with one.  As stated above, please use over the counter pain medicine - it will also be helpful with decreasing your swelling.    ANESTHESIA: -After your surgery, post-surgical discomfort or pain is likely. This discomfort can last several days to a few weeks. At certain times of the day your discomfort may be more intense.   Did you receive a nerve block?   - A nerve block can provide pain relief for one hour to two days after your surgery. As long as the nerve block is working, you will experience little or no sensation in the area the surgeon operated on.  - As the nerve block wears off, you will begin to experience pain or discomfort. It is very important that you begin taking your prescribed pain medication before the nerve block fully wears off. Treating your pain at the first sign of the block wearing off will ensure your pain is better controlled and more tolerable when full-sensation returns. Do not wait until the pain is intolerable, as the medicine will be less effective. It is better to treat pain in advance than to try and catch up.   General Anesthesia:  If you did not receive a nerve block during your surgery, you will need to start taking your pain medication shortly after your surgery and  should continue to do so as prescribed by your surgeon.     ICE AND ELEVATION: - You may use ice for the first 48-72 hours, but it is not critical.   - Motion of your fingers is very important to decrease the swelling.  - Elevation, as much as possible for the next 48 hours, is critical for decreasing swelling as well as for pain relief. Elevation means when you are seated or lying down, you hand should be at or above your heart. When walking, the hand needs to be at or above the level of your elbow.  - If the bandage gets too tight, it may need to be loosened. Please contact our office and we will instruct you in how to do this.    SURGICAL BANDAGES:  - Keep your dressing and/or splint clean and dry at all times.  You can remove your dressing 4 days from now and change with a dry dressing or Band-Aids as needed thereafter. - You may place a plastic bag over your bandage to shower, but be careful, do not get your bandages wet.  - After the bandages have been removed, it is OK to get the stitches wet in a shower or with hand washing. Do Not soak or submerge the wound yet. Please do not use lotions or creams on the stitches.      HAND THERAPY:  - You may not need any. If you do,  we will begin this at your follow up visit in the clinic.    ACTIVITY AND WORK: - You are encouraged to move any fingers which are not in the bandage.  - Light use of the fingers is allowed to assist the other hand with daily hygiene and eating, but strong gripping or lifting is often uncomfortable and should be avoided.  - You might miss a variable period of time from work and hopefully this issue has been discussed prior to surgery. You may not do any heavy work with your affected hand for about 2 weeks.    [facility] [address] [place],  [place]  [address] [phone]

## 2022-07-12 NOTE — Anesthesia Preprocedure Evaluation (Signed)
Anesthesia Evaluation  Patient identified by MRN, date of birth, ID band Patient awake    Reviewed: Allergy & Precautions, H&P , NPO status , Patient's Chart, lab work & pertinent test results  Airway Mallampati: II   Neck ROM: full    Dental   Pulmonary sleep apnea ,    breath sounds clear to auscultation       Cardiovascular negative cardio ROS   Rhythm:regular Rate:Normal     Neuro/Psych PSYCHIATRIC DISORDERS Anxiety Depression  Neuromuscular disease    GI/Hepatic GERD  ,  Endo/Other  Hypothyroidism   Renal/GU      Musculoskeletal   Abdominal   Peds  Hematology   Anesthesia Other Findings   Reproductive/Obstetrics                             Anesthesia Physical Anesthesia Plan  ASA: 2  Anesthesia Plan: MAC   Post-op Pain Management:    Induction: Intravenous  PONV Risk Score and Plan: 2 and Propofol infusion, Midazolam and Treatment may vary due to age or medical condition  Airway Management Planned: Simple Face Mask  Additional Equipment:   Intra-op Plan:   Post-operative Plan:   Informed Consent: I have reviewed the patients History and Physical, chart, labs and discussed the procedure including the risks, benefits and alternatives for the proposed anesthesia with the patient or authorized representative who has indicated his/her understanding and acceptance.     Dental advisory given  Plan Discussed with: CRNA, Anesthesiologist and Surgeon  Anesthesia Plan Comments:         Anesthesia Quick Evaluation

## 2022-07-12 NOTE — Anesthesia Procedure Notes (Signed)
Procedure Name: MAC Date/Time: 07/12/2022 1:30 PM  Performed by: Anastasio Auerbach, CRNAPre-anesthesia Checklist: Patient identified, Emergency Drugs available, Suction available and Patient being monitored Oxygen Delivery Method: Nasal cannula

## 2022-07-12 NOTE — Interval H&P Note (Signed)
History and Physical Interval Note:  07/12/2022 11:52 AM  Amy Mcintyre  has presented today for surgery, with the diagnosis of RIGHT CARPAL TUNNEL SYNDROME.  The various methods of treatment have been discussed with the patient and family. After consideration of risks, benefits and other options for treatment, the patient has consented to  Procedure(s): RIGHT CARPAL TUNNEL RELEASE (Right) as a surgical intervention.  The patient's history has been reviewed, patient examined, no change in status, stable for surgery.  I have reviewed the patient's chart and labs.  Questions were answered to the patient's satisfaction.      Angelee Bahr

## 2022-07-12 NOTE — Transfer of Care (Signed)
Immediate Anesthesia Transfer of Care Note  Patient: Amy Mcintyre  Procedure(s) Performed: RIGHT CARPAL TUNNEL RELEASE (Right: Hand)  Patient Location: PACU  Anesthesia Type:MAC  Level of Consciousness: awake, alert  and oriented  Airway & Oxygen Therapy: Patient Spontanous Breathing  Post-op Assessment: Report given to RN and Post -op Vital signs reviewed and stable  Post vital signs: Reviewed and stable  Last Vitals:  Vitals Value Taken Time  BP 146/84 07/12/22 1408  Temp    Pulse 95 07/12/22 1410  Resp 12 07/12/22 1410  SpO2 95 % 07/12/22 1410  Vitals shown include unvalidated device data.  Last Pain:  Vitals:   07/12/22 1142  TempSrc:   PainSc: 0-No pain         Complications: No notable events documented.

## 2022-07-13 ENCOUNTER — Encounter (HOSPITAL_COMMUNITY): Payer: Self-pay | Admitting: Orthopedic Surgery

## 2022-07-13 NOTE — Anesthesia Postprocedure Evaluation (Signed)
Anesthesia Post Note  Patient: Amy Mcintyre  Procedure(s) Performed: RIGHT CARPAL TUNNEL RELEASE (Right: Hand)     Patient location during evaluation: PACU Anesthesia Type: MAC Level of consciousness: awake and alert Pain management: pain level controlled Vital Signs Assessment: post-procedure vital signs reviewed and stable Respiratory status: spontaneous breathing, nonlabored ventilation, respiratory function stable and patient connected to nasal cannula oxygen Cardiovascular status: stable and blood pressure returned to baseline Postop Assessment: no apparent nausea or vomiting Anesthetic complications: no   No notable events documented.  Last Vitals:  Vitals:   07/12/22 1425 07/12/22 1440  BP: (!) 141/79 (!) 153/80  Pulse: 88 86  Resp: 16 15  Temp:  36.6 C  SpO2: 95% 95%    Last Pain:  Vitals:   07/12/22 1440  TempSrc:   PainSc: 0-No pain                 Juri Dinning S

## 2022-07-22 ENCOUNTER — Ambulatory Visit (INDEPENDENT_AMBULATORY_CARE_PROVIDER_SITE_OTHER): Payer: PRIVATE HEALTH INSURANCE | Admitting: Orthopedic Surgery

## 2022-07-22 DIAGNOSIS — G5601 Carpal tunnel syndrome, right upper limb: Secondary | ICD-10-CM

## 2022-07-22 NOTE — Progress Notes (Signed)
Post-Op Visit Note   Patient: Amy Mcintyre           Date of Birth: 1966-02-05           MRN: [id number] Visit Date: 07/22/2022 PCP: Girtha Rm, NP-C   Assessment & Plan:  Chief Complaint:  Chief Complaint  Patient presents with   Right Hand - Routine Post Op    Had right Carpal Tunnel Release on 07/12/22   Visit Diagnoses:  1. Carpal tunnel syndrome of right wrist     Plan: Patient is no approximately 10 days out from her R CTR.  She's doing well postoperatively.  Her incision is clean, dry, and well approximated.  She has 5/5 thenar motor strength. I can see her back again as needed.   Follow-Up Instructions: No follow-ups on file.   Orders:  No orders of the defined types were placed in this encounter.  No orders of the defined types were placed in this encounter.   Imaging: No results found.  PMFS History: Patient Active Problem List   Diagnosis Date Noted   Vitamin D deficiency 05/06/2022   Right hand paresthesia 05/05/2022   Insulin resistance 05/05/2022   Elevated liver enzymes 05/05/2022   Allergic rhinitis 05/05/2022   Alopecia 05/05/2022   Carpal tunnel syndrome of right wrist 05/05/2022   Chronic myofascial pain 05/05/2022   Diarrhea 05/05/2022   Fatigue 05/05/2022   Major depression 05/05/2022   High risk medication use 05/05/2022   Prediabetes 05/05/2022   Pericapsulitis of shoulder 05/05/2022   Palpitation 05/05/2022   Rash 05/05/2022   Vitamin D toxicity 05/05/2022   Polyarthropathy 05/05/2022   BMI 31.0-31.9,adult 05/05/2022   Menopausal state 05/05/2022   Low back pain 05/05/2022   Fatty liver 05/05/2022   Chronic diarrhea 05/05/2022   Tubular adenoma 01/29/2022   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   Gastric reflux 01/29/2022   Insomnia 09/22/2018   Severe recurrent major depression without psychotic features ([place]) 09/11/2018   Generalized anxiety disorder 09/11/2018   Hypothyroidism 08/17/2018   Seasonal allergies  05/22/2012   Premenstrual dysphoric disorder 05/22/2012   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   Hypercholesterolemia 02/13/2004   Past Medical History:  Diagnosis Date   Allergy    Anxiety    Cataracts, bilateral    Colitis    Depression    Eosinophilic esophagitis    Fatty liver    Gastritis    GERD (gastroesophageal reflux disease)    Hyperlipidemia    Pre-diabetes    Sleep apnea    Thyroid disease    TMJ (dislocation of temporomandibular joint)     Family History  Problem Relation Age of Onset   Anxiety disorder Mother    Panic disorder Mother    OCD Mother    Depression Mother    Arthritis Mother    High Cholesterol Mother    Anxiety disorder Father    Depression Father    Colon polyps Father    Heart disease Father    High Cholesterol Father    High blood pressure Father    Drug abuse Brother    Asthma Brother    COPD Brother    Depression Brother    Arthritis Maternal Grandmother    Depression Maternal Grandfather    Depression Paternal Grandmother    Alcohol abuse Paternal Grandfather    Heart disease Paternal Grandfather    Colon cancer Neg Hx    Esophageal cancer Neg Hx    Pancreatic cancer Neg Hx  Stomach cancer Neg Hx    Liver disease Neg Hx    Rectal cancer Neg Hx     Past Surgical History:  Procedure Laterality Date   BREAST ENHANCEMENT SURGERY     CARPAL TUNNEL RELEASE Right 07/12/2022   Procedure: RIGHT CARPAL TUNNEL RELEASE;  Surgeon: Sherilyn Cooter, MD;  Location: [facility];  Service: Orthopedics;  Laterality: Right;   CESAREAN SECTION     NASAL SINUS SURGERY     tummy tuck     WRIST SURGERY Left    Social History   Occupational History   Occupation: MANAGER  Tobacco Use   Smoking status: Never    Passive exposure: Never   Smokeless tobacco: Never  Vaping Use   Vaping Use: Never used  Substance and Sexual Activity   Alcohol use: Yes    Comment: socially   Drug use: Never   Sexual activity: Not Currently

## 2022-07-26 ENCOUNTER — Telehealth: Payer: Self-pay | Admitting: Physical Medicine and Rehabilitation

## 2022-07-26 NOTE — Telephone Encounter (Signed)
Pt need to cancel appt. Pt phone number is [phone]

## 2022-07-28 ENCOUNTER — Encounter: Payer: PRIVATE HEALTH INSURANCE | Admitting: Physical Medicine and Rehabilitation

## 2022-07-28 ENCOUNTER — Encounter: Payer: Self-pay | Admitting: Family Medicine

## 2022-07-28 ENCOUNTER — Ambulatory Visit (INDEPENDENT_AMBULATORY_CARE_PROVIDER_SITE_OTHER): Payer: PRIVATE HEALTH INSURANCE | Admitting: Family Medicine

## 2022-07-28 VITALS — BP 120/84 | HR 105 | Temp 97.5°F | Ht 59.0 in | Wt 161.0 lb

## 2022-07-28 DIAGNOSIS — N898 Other specified noninflammatory disorders of vagina: Secondary | ICD-10-CM | POA: Diagnosis not present

## 2022-07-28 DIAGNOSIS — M26609 Unspecified temporomandibular joint disorder, unspecified side: Secondary | ICD-10-CM

## 2022-07-28 DIAGNOSIS — E039 Hypothyroidism, unspecified: Secondary | ICD-10-CM | POA: Diagnosis not present

## 2022-07-28 DIAGNOSIS — Z1239 Encounter for other screening for malignant neoplasm of breast: Secondary | ICD-10-CM

## 2022-07-28 DIAGNOSIS — R350 Frequency of micturition: Secondary | ICD-10-CM | POA: Diagnosis not present

## 2022-07-28 DIAGNOSIS — K760 Fatty (change of) liver, not elsewhere classified: Secondary | ICD-10-CM | POA: Diagnosis not present

## 2022-07-28 DIAGNOSIS — E559 Vitamin D deficiency, unspecified: Secondary | ICD-10-CM | POA: Diagnosis not present

## 2022-07-28 DIAGNOSIS — R7303 Prediabetes: Secondary | ICD-10-CM

## 2022-07-28 DIAGNOSIS — E785 Hyperlipidemia, unspecified: Secondary | ICD-10-CM

## 2022-07-28 DIAGNOSIS — K219 Gastro-esophageal reflux disease without esophagitis: Secondary | ICD-10-CM

## 2022-07-28 DIAGNOSIS — R748 Abnormal levels of other serum enzymes: Secondary | ICD-10-CM

## 2022-07-28 LAB — POCT URINALYSIS DIPSTICK
Bilirubin, UA: NEGATIVE
Blood, UA: NEGATIVE
Glucose, UA: NEGATIVE
Ketones, UA: NEGATIVE
Leukocytes, UA: NEGATIVE
Nitrite, UA: NEGATIVE
Protein, UA: NEGATIVE
Spec Grav, UA: >=1.03 — AB (ref 1.010–1.025)
Urobilinogen, UA: 0.2 E.U./dL
pH, UA: 6 (ref 5.0–8.0)

## 2022-07-28 LAB — CBC WITH DIFFERENTIAL/PLATELET
Basophils Absolute: 0 10*3/uL (ref 0.0–0.1)
Basophils Relative: 0.4 % (ref 0.0–3.0)
Eosinophils Absolute: 0.1 10*3/uL (ref 0.0–0.7)
Eosinophils Relative: 2.4 % (ref 0.0–5.0)
HCT: 38.9 % (ref 36.0–46.0)
Hemoglobin: 13 g/dL (ref 12.0–15.0)
Lymphocytes Relative: 31.8 % (ref 12.0–46.0)
Lymphs Abs: 1.9 10*3/uL (ref 0.7–4.0)
MCHC: 33.4 g/dL (ref 30.0–36.0)
MCV: 92.2 fl (ref 78.0–100.0)
Monocytes Absolute: 0.4 10*3/uL (ref 0.1–1.0)
Monocytes Relative: 6.7 % (ref 3.0–12.0)
Neutro Abs: 3.4 10*3/uL (ref 1.4–7.7)
Neutrophils Relative %: 58.7 % (ref 43.0–77.0)
Platelets: 225 10*3/uL (ref 150.0–400.0)
RBC: 4.21 Mil/uL (ref 3.87–5.11)
RDW: 14.1 % (ref 11.5–15.5)
WBC: 5.8 10*3/uL (ref 4.0–10.5)

## 2022-07-28 LAB — COMPREHENSIVE METABOLIC PANEL
ALT: 90 U/L — ABNORMAL HIGH (ref 0–35)
AST: 75 U/L — ABNORMAL HIGH (ref 0–37)
Albumin: 4.7 g/dL (ref 3.5–5.2)
Alkaline Phosphatase: 83 U/L (ref 39–117)
BUN: 15 mg/dL (ref 6–23)
CO2: 24 mEq/L (ref 19–32)
Calcium: 9.8 mg/dL (ref 8.4–10.5)
Chloride: 99 mEq/L (ref 96–112)
Creatinine, Ser: 0.71 mg/dL (ref 0.40–1.20)
GFR: 94.87 mL/min (ref >60.00)
Glucose, Bld: 114 mg/dL — ABNORMAL HIGH (ref 70–99)
Potassium: 3.8 mEq/L (ref 3.5–5.1)
Sodium: 139 mEq/L (ref 135–145)
Total Bilirubin: 0.3 mg/dL (ref 0.2–1.2)
Total Protein: 7.6 g/dL (ref 6.0–8.3)

## 2022-07-28 LAB — LIPID PANEL
Cholesterol: 188 mg/dL (ref 0–200)
HDL: 48.4 mg/dL (ref >39.00)
NonHDL: 139.8
Total CHOL/HDL Ratio: 4
Triglycerides: 273 mg/dL — ABNORMAL HIGH (ref 0.0–149.0)
VLDL: 54.6 mg/dL — ABNORMAL HIGH (ref 0.0–40.0)

## 2022-07-28 LAB — LDL CHOLESTEROL, DIRECT: Direct LDL: 116 mg/dL

## 2022-07-28 LAB — TSH: TSH: 1.66 u[IU]/mL (ref 0.35–5.50)

## 2022-07-28 MED ORDER — LEVOTHYROXINE SODIUM 75 MCG PO TABS: 75.0000 ug | ORAL_TABLET | Freq: Every day | ORAL | 2 refills | Status: DC

## 2022-07-28 MED ORDER — BACLOFEN 20 MG PO TABS: 20.0000 mg | ORAL_TABLET | Freq: Three times a day (TID) | ORAL | 2 refills | Status: DC | PRN

## 2022-07-28 MED ORDER — PANTOPRAZOLE SODIUM 40 MG PO TBEC: 40.0000 mg | DELAYED_RELEASE_TABLET | Freq: Every day | ORAL | 2 refills | Status: DC

## 2022-07-28 MED ORDER — GABAPENTIN 300 MG PO CAPS: 300.0000 mg | ORAL_CAPSULE | Freq: Three times a day (TID) | ORAL | 2 refills | Status: DC

## 2022-07-28 MED ORDER — FLUCONAZOLE 150 MG PO TABS: 150.0000 mg | ORAL_TABLET | Freq: Once | ORAL | 0 refills | Status: AC

## 2022-07-28 MED ORDER — ATORVASTATIN CALCIUM 40 MG PO TABS: 40.0000 mg | ORAL_TABLET | Freq: Every day | ORAL | 2 refills | Status: DC

## 2022-07-28 NOTE — Assessment & Plan Note (Signed)
Check lipid panel.  Continue statin therapy and adjust dose as appropriate.

## 2022-07-28 NOTE — Patient Instructions (Signed)
Please go downstairs for labs before you leave today.   Avoid alcohol. Eat a low fat, low cholesterol diet and limit sugar and carbohydrates.   We will be in touch with your results.

## 2022-07-28 NOTE — Assessment & Plan Note (Signed)
Vitamin D level normal in May.  Continue current 1000 IUs daily

## 2022-07-28 NOTE — Progress Notes (Signed)
Subjective:     Patient ID: Amy Mcintyre, female    DOB: February 08, 1966, 56 y.o.   MRN: [id number]  Chief Complaint  Patient presents with   Follow-up    3 month f/u    HPI Patient is in today for a 3 month follow up.  I saw her for the first time in May 2023.  She lives in [place], [place]  Other providers:  [facility], [facility] for GAD, depression and insomnia.  GI- Dr. Tarri Glenn for chronic diarrhea and fatty liver  Eye doctor Dentist  Dermatologist - Dr. Quentin Cornwall in [place].  OB/GYN in [place]   Elevated liver enzymes and severe diffuse fatty liver disease with hepatomegaly per CT scan in February 2023.  She sees Dr. Tarri Glenn, GI. Asymptomatic.  States she drinks alcohol socially.  Hx of hypothyroidism- previously seeing an endocrinologist.  Reports taking levothyroxine daily on an empty stomach.   TMJ-reports taking baclofen 20 mg tid and gabapentin 300 mg tid  She uses mouthguard during the day   She is under the care of a psychiatrist for anxiety and those medications are prescribed by a psychiatrist.  Prediabetes-she thinks metformin may have caused diarrhea.   Taking Lipitor 40 mg every other day.  Lipids have not been checked in more than 1 year  Back pain is much better.  States she canceled her epidural.   Mammogram -request to have an order sent to [facility] in [facility].   At the end of the visit she complained of urinary frequency and vaginal itching.  Denies concern for STDs. No dysuria, urgency, abdominal pain or back pain.  Denies fever, chills, vomiting or diarrhea.  Recent carpal tunnel surgery    Health Maintenance Due  Topic Date Due   HIV Screening  Never done   PAP SMEAR-Modifier  Never done   MAMMOGRAM  Never done    Past Medical History:  Diagnosis Date   Allergy    Anxiety    Cataracts, bilateral    Colitis    Depression    Eosinophilic esophagitis    Fatty liver    Gastritis    GERD  (gastroesophageal reflux disease)    Hyperlipidemia    Pre-diabetes    Sleep apnea    Thyroid disease    TMJ (dislocation of temporomandibular joint)     Past Surgical History:  Procedure Laterality Date   BREAST ENHANCEMENT SURGERY     CARPAL TUNNEL RELEASE Right 07/12/2022   Procedure: RIGHT CARPAL TUNNEL RELEASE;  Surgeon: Sherilyn Cooter, MD;  Location: [facility];  Service: Orthopedics;  Laterality: Right;   CESAREAN SECTION     NASAL SINUS SURGERY     tummy tuck     WRIST SURGERY Left     Family History  Problem Relation Age of Onset   Anxiety disorder Mother    Panic disorder Mother    OCD Mother    Depression Mother    Arthritis Mother    High Cholesterol Mother    Anxiety disorder Father    Depression Father    Colon polyps Father    Heart disease Father    High Cholesterol Father    High blood pressure Father    Drug abuse Brother    Asthma Brother    COPD Brother    Depression Brother    Arthritis Maternal Grandmother    Depression Maternal Grandfather    Depression Paternal Grandmother    Alcohol abuse Paternal [employer] officer  Heart disease Paternal Grandfather    Colon cancer Neg Hx    Esophageal cancer Neg Hx    Pancreatic cancer Neg Hx    Stomach cancer Neg Hx    Liver disease Neg Hx    Rectal cancer Neg Hx     Social History   Socioeconomic History   Marital status: Single    Spouse name: Not on file   Number of children: 1   Years of education: Not on file   Highest education level: Not on file  Occupational History   Occupation: MANAGER  Tobacco Use   Smoking status: Never    Passive exposure: Never   Smokeless tobacco: Never  Vaping Use   Vaping Use: Never used  Substance and Sexual Activity   Alcohol use: Yes    Comment: socially   Drug use: Never   Sexual activity: Not Currently  Other Topics Concern   Not on file  Social History Narrative   Not on file   Social Determinants of Health   Financial Resource Strain: Not on file   Food Insecurity: Not on file  Transportation Needs: Not on file  Physical Activity: Not on file  Stress: Not on file  Social Connections: Not on file  Intimate Partner Violence: Not on file    Outpatient Medications Prior to Visit  Medication Sig Dispense Refill   b complex vitamins capsule Take 1 capsule by mouth daily.     brexpiprazole (REXULTI) 1 MG TABS tablet Take 1 tablet (1 mg total) by mouth daily. At bedtime (Patient taking differently: Take 1 mg by mouth at bedtime.) 90 tablet 1   cholecalciferol (VITAMIN D3) 25 MCG (1000 UNIT) tablet Take 1,000 Units by mouth daily.     clonazePAM (KLONOPIN) 1 MG tablet TAKE 1/2 TO 1 (ONE-HALF TO ONE) TABLET BY MOUTH THREE TIMES DAILY AS NEEDED FOR PANIC ATTACK OR INSOMNIA 90 tablet 3   sertraline (ZOLOFT) 100 MG tablet Take 2 tablets (200 mg total) by mouth daily. 180 tablet 1   atorvastatin (LIPITOR) 40 MG tablet Take 40 mg by mouth at bedtime.     baclofen (LIORESAL) 20 MG tablet Take 20 mg by mouth 3 (three) times daily as needed for muscle spasms.     gabapentin (NEURONTIN) 300 MG capsule Take 300 mg by mouth 3 (three) times daily.     levothyroxine (SYNTHROID) 75 MCG tablet Take 1 tablet (75 mcg total) by mouth daily. 30 tablet 2   pantoprazole (PROTONIX) 40 MG tablet Take 40 mg by mouth daily.     meloxicam (MOBIC) 7.5 MG tablet Take 1 tablet (7.5 mg total) by mouth daily. (Patient not taking: Reported on 07/09/2022) 21 tablet 0   tetrahydrozoline 0.05 % ophthalmic solution Place 1 drop into both eyes daily as needed (red eyes). (Patient not taking: Reported on 07/28/2022)     No facility-administered medications prior to visit.    Allergies  Allergen Reactions   Ambien [Zolpidem Tartrate] Other (See Comments)    Sleep walking   Erythromycin Swelling    Top lip swelled up.   Penicillins Swelling    Childhood reaction    ROS     Objective:    Physical Exam Constitutional:      General: She is not in acute distress.     Appearance: She is not ill-appearing.  Eyes:     Extraocular Movements: Extraocular movements intact.     Conjunctiva/sclera: Conjunctivae normal.  Cardiovascular:     Rate and Rhythm:  Normal rate.  Pulmonary:     Effort: Pulmonary effort is normal.  Musculoskeletal:     Cervical back: Normal range of motion.     Right lower leg: No edema.     Left lower leg: No edema.  Skin:    General: Skin is dry.  Neurological:     General: No focal deficit present.     Mental Status: She is alert and oriented to person, place, and time.     Cranial Nerves: No cranial nerve deficit.     Motor: No weakness.     Gait: Gait normal.  Psychiatric:        Mood and Affect: Mood normal.        Behavior: Behavior normal.        Thought Content: Thought content normal.     BP 120/84 (BP Location: Left Arm, Patient Position: Sitting, Cuff Size: Large)   Pulse (!) 105   Temp (!) 97.5 F (36.4 C) (Temporal)   Ht 4' 11"  (1.499 m)   Wt 161 lb (73 kg)   LMP 02/04/2019   SpO2 96%   BMI 32.52 kg/m  Wt Readings from Last 3 Encounters:  07/28/22 161 lb (73 kg)  07/12/22 160 lb (72.6 kg)  05/13/22 159 lb (72.1 kg)       Assessment & Plan:   Problem List Items Addressed This Visit       Digestive   Fatty liver    Advised that she is at an increased risk of developing liver cancer and cirrhosis.  Recommend avoiding all alcohol and eating a low-fat diet.      Relevant Orders   Comprehensive metabolic panel     Endocrine   Hypothyroidism   Relevant Medications   levothyroxine (SYNTHROID) 75 MCG tablet   Other Relevant Orders   TSH     Musculoskeletal and Integument   Temporomandibular joint disorder    Continue baclofen and gabapentin.  She is taking both medications 3 times daily.  She still has some breakthrough pain at times.  Wearing a mouthguard during the day.  Thinks when she retires and her stress is decreased then her pain will improve      Relevant Medications   baclofen  (LIORESAL) 20 MG tablet   gabapentin (NEURONTIN) 300 MG capsule     Genitourinary   Vaginal itching    Diflucan prescribed.  She will follow-up if not resolved.      Relevant Medications   fluconazole (DIFLUCAN) 150 MG tablet   Other Relevant Orders   POCT urinalysis dipstick (Completed)   Urine Culture     Other   Elevated liver enzymes - Primary    She is aware that her liver function tests are elevated and aware of severe fatty liver disease.  Counseling on taking good care of her liver by avoiding alcohol, NSAIDs and eating a low-fat diet.  Follow-up with GI      Relevant Orders   CBC with Differential/Platelet   Comprehensive metabolic panel   Encounter for screening for malignant neoplasm of breast    Order placed for preferred location for patient in [place].      Relevant Orders   MM 3D SCREEN BREAST BILATERAL   Gastric reflux    Refilled Protonix.  Continue to follow-up with GI as recommended.      Relevant Medications   pantoprazole (PROTONIX) 40 MG tablet   Hyperlipidemia    Check lipid panel.  Continue statin therapy and adjust  dose as appropriate.      Relevant Medications   atorvastatin (LIPITOR) 40 MG tablet   Other Relevant Orders   Lipid panel   Prediabetes    Hemoglobin A1c 6.2%.  Recommend low sugar and carbohydrate diet and increasing physical activity as tolerated.  Did not tolerate metformin, thought diarrhea caused by medication.      Relevant Orders   CBC with Differential/Platelet   Comprehensive metabolic panel   Urinary frequency    Urinalysis dipstick negative.  Send urine culture.  Follow-up if not improving.      Relevant Orders   POCT urinalysis dipstick (Completed)   Urine Culture   Vitamin D deficiency    Vitamin D level normal in May.  Continue current 1000 IUs daily       I have discontinued Deetra Wilshire's meloxicam and tetrahydrozoline. I have also changed her atorvastatin, pantoprazole, baclofen, and  gabapentin. Additionally, I am having her start on fluconazole. Lastly, I am having her maintain her cholecalciferol, brexpiprazole, sertraline, clonazePAM, b complex vitamins, and levothyroxine.  Meds ordered this encounter  Medications   atorvastatin (LIPITOR) 40 MG tablet    Sig: Take 1 tablet (40 mg total) by mouth at bedtime.    Dispense:  30 tablet    Refill:  2   levothyroxine (SYNTHROID) 75 MCG tablet    Sig: Take 1 tablet (75 mcg total) by mouth daily.    Dispense:  30 tablet    Refill:  2   pantoprazole (PROTONIX) 40 MG tablet    Sig: Take 1 tablet (40 mg total) by mouth daily.    Dispense:  30 tablet    Refill:  2   baclofen (LIORESAL) 20 MG tablet    Sig: Take 1 tablet (20 mg total) by mouth 3 (three) times daily as needed for muscle spasms.    Dispense:  30 each    Refill:  2    Order Specific Question:   Supervising Provider    Answer:   Pricilla Holm A [[id number]]   gabapentin (NEURONTIN) 300 MG capsule    Sig: Take 1 capsule (300 mg total) by mouth 3 (three) times daily.    Dispense:  90 capsule    Refill:  2    Order Specific Question:   Supervising Provider    Answer:   Pricilla Holm A [[id number]]   fluconazole (DIFLUCAN) 150 MG tablet    Sig: Take 1 tablet (150 mg total) by mouth once for 1 dose.    Dispense:  1 tablet    Refill:  0    Order Specific Question:   Supervising Provider    Answer:   Pricilla Holm A [[id number]]

## 2022-07-28 NOTE — Assessment & Plan Note (Signed)
Order placed for preferred location for patient in [place].

## 2022-07-28 NOTE — Assessment & Plan Note (Signed)
Advised that she is at an increased risk of developing liver cancer and cirrhosis.  Recommend avoiding all alcohol and eating a low-fat diet.

## 2022-07-28 NOTE — Assessment & Plan Note (Signed)
She is aware that her liver function tests are elevated and aware of severe fatty liver disease.  Counseling on taking good care of her liver by avoiding alcohol, NSAIDs and eating a low-fat diet.  Follow-up with GI

## 2022-07-28 NOTE — Assessment & Plan Note (Signed)
Diflucan prescribed.  She will follow-up if not resolved.

## 2022-07-28 NOTE — Assessment & Plan Note (Signed)
Urinalysis dipstick negative.  Send urine culture.  Follow-up if not improving.

## 2022-07-28 NOTE — Assessment & Plan Note (Signed)
Refilled Protonix.  Continue to follow-up with GI as recommended.

## 2022-07-28 NOTE — Assessment & Plan Note (Signed)
Hemoglobin A1c 6.2%.  Recommend low sugar and carbohydrate diet and increasing physical activity as tolerated.  Did not tolerate metformin, thought diarrhea caused by medication.

## 2022-07-28 NOTE — Assessment & Plan Note (Signed)
Continue baclofen and gabapentin.  She is taking both medications 3 times daily.  She still has some breakthrough pain at times.  Wearing a mouthguard during the day.  Thinks when she retires and her stress is decreased then her pain will improve

## 2022-07-29 LAB — URINE CULTURE: Result:: NO GROWTH

## 2022-07-30 ENCOUNTER — Encounter: Payer: Self-pay | Admitting: Gastroenterology

## 2022-08-03 NOTE — Telephone Encounter (Signed)
Follow up scheduled for 11/03/22 at 8:30 am with Dr. Tarri Glenn. Pt is aware. Lab order will be sent in closer to appointment date. Staff reminder sent to self to send orders & notify patient when orders are received.

## 2022-08-19 LAB — FECAL OCCULT BLOOD, IMMUNOCHEMICAL: IFOBT: NEGATIVE

## 2022-09-06 ENCOUNTER — Encounter: Payer: Self-pay | Admitting: Family Medicine

## 2022-09-24 ENCOUNTER — Encounter: Payer: Self-pay | Admitting: Family Medicine

## 2022-10-06 ENCOUNTER — Other Ambulatory Visit: Payer: Self-pay

## 2022-10-06 DIAGNOSIS — R7989 Other specified abnormal findings of blood chemistry: Secondary | ICD-10-CM

## 2022-10-14 ENCOUNTER — Encounter: Payer: Self-pay | Admitting: Family Medicine

## 2022-10-14 ENCOUNTER — Other Ambulatory Visit: Payer: Self-pay | Admitting: Family Medicine

## 2022-10-14 DIAGNOSIS — M26609 Unspecified temporomandibular joint disorder, unspecified side: Secondary | ICD-10-CM

## 2022-10-14 NOTE — Telephone Encounter (Signed)
LOV: 07/28/22 Last fill: 07/28/22 30 tablets with 2 refills

## 2022-10-18 ENCOUNTER — Other Ambulatory Visit: Payer: Self-pay | Admitting: Family Medicine

## 2022-10-18 DIAGNOSIS — E785 Hyperlipidemia, unspecified: Secondary | ICD-10-CM

## 2022-10-21 ENCOUNTER — Telehealth: Payer: Self-pay

## 2022-10-21 ENCOUNTER — Other Ambulatory Visit: Payer: Self-pay | Admitting: Gastroenterology

## 2022-10-21 NOTE — Telephone Encounter (Signed)
Patient called in stating [facility] did not receive lab orders. New order faxed with number she provided [phone].

## 2022-10-22 ENCOUNTER — Other Ambulatory Visit: Payer: Self-pay | Admitting: Psychiatry

## 2022-10-22 DIAGNOSIS — F411 Generalized anxiety disorder: Secondary | ICD-10-CM

## 2022-10-22 DIAGNOSIS — F5101 Primary insomnia: Secondary | ICD-10-CM

## 2022-10-22 LAB — SPECIMEN STATUS REPORT

## 2022-10-22 NOTE — Telephone Encounter (Signed)
Filled 10/15 appt 12/4

## 2022-10-25 LAB — HEPATIC FUNCTION PANEL
ALT: 120 IU/L — ABNORMAL HIGH (ref 0–32)
AST: 73 IU/L — ABNORMAL HIGH (ref 0–40)
Albumin: 4.9 g/dL (ref 3.8–4.9)
Alkaline Phosphatase: 97 IU/L (ref 44–121)
Bilirubin Total: 0.2 mg/dL (ref 0.0–1.2)
Bilirubin, Direct: 0.12 mg/dL (ref 0.00–0.40)
Total Protein: 7.1 g/dL (ref 6.0–8.5)

## 2022-11-03 ENCOUNTER — Other Ambulatory Visit: Payer: PRIVATE HEALTH INSURANCE

## 2022-11-03 ENCOUNTER — Ambulatory Visit (INDEPENDENT_AMBULATORY_CARE_PROVIDER_SITE_OTHER): Payer: PRIVATE HEALTH INSURANCE | Admitting: Gastroenterology

## 2022-11-03 ENCOUNTER — Encounter: Payer: Self-pay | Admitting: Gastroenterology

## 2022-11-03 ENCOUNTER — Other Ambulatory Visit: Payer: Self-pay | Admitting: Gastroenterology

## 2022-11-03 VITALS — HR 95 | Ht 59.0 in | Wt 159.0 lb

## 2022-11-03 DIAGNOSIS — R932 Abnormal findings on diagnostic imaging of liver and biliary tract: Secondary | ICD-10-CM | POA: Diagnosis not present

## 2022-11-03 DIAGNOSIS — R7989 Other specified abnormal findings of blood chemistry: Secondary | ICD-10-CM | POA: Diagnosis not present

## 2022-11-03 NOTE — Patient Instructions (Addendum)
It was a pleasure to see you today.  Please stop using the supplements. Although these may help the liver, they might also result in inflammation.  The Rexulti has been associated with elevated liver enzymes. However, the serum aminotransferase elevations that occur with brexpiprazole therapy are usually self-limited and often do not require dose modification or discontinuation. No instances of acute liver failure, chronic hepatitis or vanishing bile duct syndrome have been attributed to brexpiprazole. If you continue on Rexulti,  recommend that you have liver tests every 3-6 months.  I have recommended an MRI to follow-up on your suspected adenoma. I think this is unlikely to be the cause of your elevated liver enzymes, but, we should be sure.   Please do everything that you can to protect and love on your liver - including do not drink any alcohol.   Your provider has requested that you go to the basement level for lab work before leaving today. Press "B" on the elevator. The lab is located at the first door on the left as you exit the elevator.  You have been scheduled for an MRI at [facility] on Friday 11/12/22. Your appointment time is 7 am. Please arrive to admitting (at main entrance of the hospital) 30 minutes prior to your appointment time for registration purposes. Please make certain not to have anything to eat or drink 6 hours prior to your test. In addition, if you have any metal in your body, have a pacemaker or defibrillator, please be sure to let your ordering physician know. This test typically takes 45 minutes to 1 hour to complete. Should you need to reschedule, please call [phone] to do so.

## 2022-11-03 NOTE — Progress Notes (Signed)
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      - MRI  04/24/2019:  liver focal nodular hyperplasia. A bone island was also seen.    - MRI with Eovist 08/07/2019: stable 1.6 x 2.2 cm enhancing lesion c/w hepatic adenoma    - CT with and without contrast 01/08/22: stable findings Suspected SIBO despite prior negative testing Diarrhea- IBS    - Chronic diarrhea with intermittent bloating     - initially thought to be related to hypothyroidism     - testing for celiac negative, giardia    - normal fecal calprotectin    - negative colon and duodenal biopsies    - not improved by probiotics    - breath test negative for SIBO by hydrogen or methane    - allergy testing 2020: cow's milk, string beans Bleeding internal hemorrhoids     - band ligation x 3 with Dr. Silverio Decamp    - continue to maximize medical therapy Pelvic Floor dysfunction with decreased sphincter tone    - Completed pelvic floor PT with clinical improvement Reflux (biopsy proven) on EGD 10/19/18    - resumed PPI when she developed dysphonia Tubular adenoma 2019    - No adenomas on  colonoscopy 2023    - surveillance colonoscopy recommended 2028 given the family history Family history of colon polyps (father in his 63s)    Increasing liver enzymes may be due to NASH as well as possible DILI related to her supplements. Rexulti has also been associated with elevated aminotransferases. The elevations that occur with brexpiprazole therapy are usually self-limited and often do not require dose modification or discontinuation. No instances of acute liver failure, chronic hepatitis or vanishing bile duct syndrome have been attributed to brexpiprazole.  MRI recommended to follow-up on her known adenoma given the recent escalation in liver enzymes.   It sounds like her diarrhea will improve when she retires (hopefully) next year. No additional evaluation or treatment discussed today.   PLAN: - MRI with and without Eovist to follow-up on abnormal lesion in segment 7 given her increasing liver enzymes - NASH FibroSURE to restage her fatty liver - AFP - Lifestyle modifications for fatty liver including no alcohol - Avoid hepatoxic medications - I recommended that she discontinue her herbal supplements - Available data notes that it is okay to continue Rexulti as long as liver enzymes do not continue to increase - Recommend following liver enzymes every 3-6 months - Return to the office in 3 months, earlier if necessary    HPI:  Tynisha Ogan is a 56 y.o. female who returns in follow-up. Prior office visits have involved evaluation of abdominal pain, bloating with concerns for increasing abdominal girth, and chronic diarrhea attributed to functional diarrhea, and bleeding hemorrhoids treated by Dr. Silverio Decamp with banding.  Evaluation included negative testing for celiac, Giardia, and normal fecal calprotectin, normal colon and duodenal biopsies, and a negative SIBO breath test.  There was temporal association in improved GI symptoms with improved regulation of thyroid.  During that  evaluation she was found to have fatty liver and a 2.7 cm focal lesion in segment 8 of the liver thought to be a Ford on prior imaging and later hepatic adenoma on subsequent imaging. She had a successful response to pelvic floor physical therapy for pelvic floor dysfunction and to improve anal sphincter tone.   She returns today with concerns for increasing elevation of her liver enzymes.  ALT is now up to 120 and AST is up to 73 as of 10/21/22. .  These levels have increased 3 fold since 2020. No associated symptoms.   She has started Rexulti over the last year.   She started milk thistle and berberemene to protect the liver.   Her GI symptoms are otherwise at baseline. She has noticed significant diarrhea only occurs with work. Hoping to retire later this year. She and her ex-husband are living together and recently bought a place at [employer]. She is hoping to retire there next year. Does not feel additional evaluation or treatment of diarrhea is needed at this time.  Remains frustrated by inability to lose weight and central abdominal weight gain. It is too far to travel for regular visits with healthy weight and wellness. She is hoping to really focus on diet and exercise in retirement.    Endoscopic evaluation:  - Colonoscopy 10/19/2018 showed left-sided diverticulosis, a small tubular adenoma in the descending colon, and nonbleeding internal hemorrhoids.  Random colon biopsies were normal. - Colonoscopy 01/14/22 showed internal hemorrhoids, diverticulosis in the sigmoid colon and in the descending colon, mild colitis from 26 to 36 cm proximal to the anus. Unclear if this is clinically significant or related to bowel prep, and one 2 mm polyp at the appendiceal orifice, removed with a cold snare. All biopsies were essentially normal.      Past Medical History:  Diagnosis Date   Allergy    Anxiety    Cataracts, bilateral    Colitis    Depression    Eosinophilic esophagitis    Fatty liver     Gastritis    GERD (gastroesophageal reflux disease)    Hyperlipidemia    Pre-diabetes    Sleep apnea    Thyroid disease    TMJ (dislocation of temporomandibular joint)     Past Surgical History:  Procedure Laterality Date   BREAST ENHANCEMENT SURGERY     CARPAL TUNNEL RELEASE Right 07/12/2022   Procedure: RIGHT CARPAL TUNNEL RELEASE;  Surgeon: Sherilyn Cooter, MD;  Location: [place];  Service: Orthopedics;  Laterality: Right;   CESAREAN SECTION     NASAL SINUS SURGERY     tummy tuck     WRIST SURGERY Left      Current Outpatient Medications  Medication Sig Dispense Refill   atorvastatin (LIPITOR) 40 MG tablet TAKE 1 TABLET BY MOUTH AT BEDTIME 90 tablet 0   b complex vitamins capsule Take 1 capsule by mouth daily.     baclofen (LIORESAL) 20 MG tablet Take 1 tablet by mouth three times daily as  needed for muscle spasm 30 tablet 0   cholecalciferol (VITAMIN D3) 25 MCG (1000 UNIT) tablet Take 1,000 Units by mouth daily.     clonazePAM (KLONOPIN) 1 MG tablet TAKE 1/2 TO 1 (ONE-HALF TO ONE) TABLET BY MOUTH THREE TIMES DAILY AS NEEDED FOR PANIC ATTACK OR INSOMNIA 90 tablet 0   gabapentin (NEURONTIN) 300 MG capsule Take 1 capsule (300 mg total) by mouth 3 (three) times daily. 90 capsule 2   levothyroxine (SYNTHROID) 75 MCG tablet Take 1 tablet (75 mcg total) by mouth daily. 30 tablet 2   pantoprazole (PROTONIX) 40 MG tablet Take 1 tablet (40 mg total) by mouth daily. 30 tablet 2   REXULTI 1 MG TABS tablet Take 1 mg by mouth at bedtime.     sertraline (ZOLOFT) 100 MG tablet Take 2 tablets (200 mg total) by mouth daily. 180 tablet 1   No current facility-administered medications for this visit.    Allergies as of 11/03/2022 - Review Complete 11/03/2022  Allergen Reaction Noted   Ambien [zolpidem tartrate] Other (See Comments) 09/22/2018   Erythromycin Swelling 09/22/2018   Penicillins Swelling 09/22/2018    Family History  Problem Relation Age of Onset   Anxiety disorder Mother     Panic disorder Mother    OCD Mother    Depression Mother    Arthritis Mother    High Cholesterol Mother    Anxiety disorder Father    Depression Father    Colon polyps Father    Heart disease Father    High Cholesterol Father    High blood pressure Father    Drug abuse Brother    Asthma Brother    COPD Brother    Depression Brother    Arthritis Maternal Grandmother    Depression Maternal Grandfather    Depression Paternal Grandmother    Alcohol abuse Paternal Grandfather    Heart disease Paternal Grandfather    Colon cancer Neg Hx    Esophageal cancer Neg Hx    Pancreatic cancer Neg Hx    Stomach cancer Neg Hx    Liver disease Neg Hx    Rectal cancer Neg Hx     Physical Exam: General:   Alert,  well-nourished, pleasant and cooperative in NAD 11/03/22 159 pounds 03/01/22 159 pounds 01/05/22 157 pounds Head:  Normocephalic and atraumatic. Eyes:  Sclera clear, no icterus.   Conjunctiva pink. Abdomen:  Soft although more firm than in the past, unchanged from my last evaluation, nontender, nondistended, normal bowel sounds, no rebound or guarding. No hepatosplenomegaly.  No fluid wave.  Msk:  Symmetrical. No boney deformities LAD: No inguinal or umbilical LAD Extremities:  No clubbing or edema. Neurologic:  Alert and  oriented x4;  grossly nonfocal Skin:  Intact without significant lesions or rashes. Psych:  Alert and cooperative. Normal mood and affect.    Deanndra Kirley L. Tarri Glenn, MD, MPH 11/03/2022, 9:36 AM

## 2022-11-05 LAB — AFP TUMOR MARKER: AFP-Tumor Marker: 2.9 ng/mL

## 2022-11-08 ENCOUNTER — Ambulatory Visit (INDEPENDENT_AMBULATORY_CARE_PROVIDER_SITE_OTHER): Payer: PRIVATE HEALTH INSURANCE | Admitting: Psychiatry

## 2022-11-08 ENCOUNTER — Encounter: Payer: Self-pay | Admitting: Psychiatry

## 2022-11-08 DIAGNOSIS — F3342 Major depressive disorder, recurrent, in full remission: Secondary | ICD-10-CM

## 2022-11-08 DIAGNOSIS — F411 Generalized anxiety disorder: Secondary | ICD-10-CM

## 2022-11-08 DIAGNOSIS — F5101 Primary insomnia: Secondary | ICD-10-CM

## 2022-11-08 DIAGNOSIS — F330 Major depressive disorder, recurrent, mild: Secondary | ICD-10-CM

## 2022-11-08 MED ORDER — CLONAZEPAM 1 MG PO TABS: ORAL_TABLET | ORAL | 2 refills | Status: DC

## 2022-11-08 MED ORDER — REXULTI 0.5 MG PO TABS: 0.5000 mg | ORAL_TABLET | Freq: Every day | ORAL | 1 refills | Status: DC

## 2022-11-08 MED ORDER — SERTRALINE HCL 100 MG PO TABS
200.0000 mg | ORAL_TABLET | Freq: Every day | ORAL | 1 refills | Status: DC
Start: 2022-11-08 — End: 2023-03-21

## 2022-11-08 NOTE — Progress Notes (Signed)
Amy Mcintyre [id number] March 19, 1966 56 y.o.  Subjective:   Patient ID:  Amy Mcintyre is a 56 y.o. (DOB September 01, 1966) female.  Chief Complaint: No chief complaint on file.   HPI Amy Mcintyre presents to the office today for follow-up of ***  She has had elevated liver enzymes and recently saw GI specialist. Discussed that   "I've been doing really well at work." She reports that she will occasionally feel "teary" at work.  She reports, "the tearfulness and diarrhea only happen at work" and diarrhea does not occur on weekends and holidays. She reports anxiety is mostly just at work. Denies panic attacks. Denies depression. Denies irritability. She reports that she sleeps well with Klonopin and questions if she would be able to sleep without Klonopin. She reports that her appetite is fair. Energy and motivation are fair. Concentration has been good. Denies anhedonia. Enjoys time with family and significant other. Denies SI.   She plans to retire in the future. She plans to move to [facility]. She is wanting to learn to cook and eat healthier. She plans to start going to the fitness center. She and her ex-husband have bought a house together. She reports that family and her relationships with them are going well.   She reports that her most recent performance evaluation was more positive. She has been leaving at 5 pm daily now instead of working over-time.   She has been doing deep breathing, mindfulness, gratitude, and grounding exercises.   Gabapentin last filled 10/27/22 x 2. Klonopin last filled 10/23/22  Past Psychiatric Medication Trials: Paxil Prozac Sertraline Nortriptyline Wellbutrin- not helpful for depression.  Doxepin BuSpar Abilify-increased heart rate Rexulti-Helpful for mood and anxiety Seroquel-adverse effects Gabapentin- excessive somnolence with TID prn Lunesta-not effective Ambien- parasomnias Trazodone-ineffective Belsomra-worsening insomnia Klonopin  AIMS     Flowsheet Row Office Visit from 05/25/2022 in [place] Office Visit from 01/29/2020 in [place] Visit from 10/04/2019 in [place] Total Score 0 0 0      PHQ2-9    [place] Office Visit from 07/28/2022 in [place] at [facility] Visit from 05/05/2022 in [place] at [facility]  PHQ-2 Total Score 2 0  PHQ-9 Total Score 4 1      Flowsheet Row Admission (Discharged) from 07/12/2022 in [place] No Risk        Review of Systems:  Review of Systems  Gastrointestinal:        Diarrhea when at work  Musculoskeletal:  Negative for gait problem.       She reports that she has had some TMJ pain  Neurological:  Negative for tremors.  Psychiatric/Behavioral:         Please refer to HPI    Medications: I have reviewed the patient's current medications.  Current Outpatient Medications  Medication Sig Dispense Refill  . atorvastatin (LIPITOR) 40 MG tablet TAKE 1 TABLET BY MOUTH AT BEDTIME 90 tablet 0  . b complex vitamins capsule Take 1 capsule by mouth daily.    . baclofen (LIORESAL) 20 MG tablet Take 1 tablet by mouth three times daily as needed for muscle spasm 30 tablet 0  . cholecalciferol (VITAMIN D3) 25 MCG (1000 UNIT) tablet Take 1,000 Units by mouth daily.    . clonazePAM (KLONOPIN) 1 MG tablet TAKE 1/2 TO 1 (ONE-HALF TO ONE) TABLET BY MOUTH THREE TIMES DAILY AS NEEDED FOR PANIC ATTACK OR INSOMNIA 90 tablet 0  .  gabapentin (NEURONTIN) 300 MG capsule Take 1 capsule (300 mg total) by mouth 3 (three) times daily. 90 capsule 2  . levothyroxine (SYNTHROID) 75 MCG tablet Take 1 tablet (75 mcg total) by mouth daily. 30 tablet 2  . pantoprazole (PROTONIX) 40 MG tablet Take 1 tablet (40 mg total) by mouth daily. 30 tablet 2  . REXULTI 1 MG TABS tablet Take 1 mg by mouth at bedtime.    . sertraline (ZOLOFT) 100 MG tablet Take 2 tablets (200 mg  total) by mouth daily. 180 tablet 1   No current facility-administered medications for this visit.    Medication Side Effects: Other: Possible elevations in LFTs  Allergies:  Allergies  Allergen Reactions  . Ambien [Zolpidem Tartrate] Other (See Comments)    Sleep walking  . Erythromycin Swelling    Top lip swelled up.  Marland Kitchen Penicillins Swelling    Childhood reaction    Past Medical History:  Diagnosis Date  . Allergy   . Anxiety   . Cataracts, bilateral   . Colitis   . Depression   . Eosinophilic esophagitis   . Fatty liver   . Gastritis   . GERD (gastroesophageal reflux disease)   . Hyperlipidemia   . Pre-diabetes   . Sleep apnea   . Thyroid disease   . TMJ (dislocation of temporomandibular joint)     Past Medical History, Surgical history, Social history, and Family history were reviewed and updated as appropriate.   Please see review of systems for further details on the patient's review from today.   Objective:   Physical Exam:  LMP 02/04/2019   Physical Exam  Lab Review:     Component Value Date/Time   NA 139 07/28/2022 0901   K 3.8 07/28/2022 0901   CL 99 07/28/2022 0901   CO2 24 07/28/2022 0901   GLUCOSE 114 (H) 07/28/2022 0901   BUN 15 07/28/2022 0901   CREATININE 0.71 07/28/2022 0901   CALCIUM 9.8 07/28/2022 0901   PROT 7.1 10/21/2022 0914   ALBUMIN 4.9 10/21/2022 0914   AST 73 (H) 10/21/2022 0914   ALT 120 (H) 10/21/2022 0914   ALT 39 (H) 04/18/2019 0821   ALKPHOS 97 10/21/2022 0914   BILITOT 0.2 10/21/2022 0914       Component Value Date/Time   WBC 5.8 07/28/2022 0901   RBC 4.21 07/28/2022 0901   HGB 13.0 07/28/2022 0901   HCT 38.9 07/28/2022 0901   PLT 225.0 07/28/2022 0901   MCV 92.2 07/28/2022 0901   MCH 30.6 07/12/2022 1157   MCHC 33.4 07/28/2022 0901   RDW 14.1 07/28/2022 0901   LYMPHSABS 1.9 07/28/2022 0901   MONOABS 0.4 07/28/2022 0901   EOSABS 0.1 07/28/2022 0901   BASOSABS 0.0 07/28/2022 0901    No results found  for: "POCLITH", "LITHIUM"   No results found for: "PHENYTOIN", "PHENOBARB", "VALPROATE", "CBMZ"   .res Assessment: Plan:    There are no diagnoses linked to this encounter.   Please see After Visit Summary for patient specific instructions.  Future Appointments  Date Time Provider [place]  11/08/2022  4:15 PM Thayer Headings, PMHNP [facility] None  11/12/2022  7:00 AM [facility] 1 [facility] [place]  01/28/2023  8:00 AM Henson, Vickie L, NP-C [facility] None    No orders of the defined types were placed in this encounter.   -------------------------------

## 2022-11-09 ENCOUNTER — Encounter: Payer: Self-pay | Admitting: Gastroenterology

## 2022-11-12 ENCOUNTER — Ambulatory Visit (HOSPITAL_COMMUNITY)
Admission: RE | Admit: 2022-11-12 | Discharge: 2022-11-12 | Disposition: A | Discharge status: Home / Self Care | Payer: PRIVATE HEALTH INSURANCE | Source: Ambulatory Visit | Attending: Gastroenterology | Admitting: Gastroenterology

## 2022-11-12 DIAGNOSIS — R932 Abnormal findings on diagnostic imaging of liver and biliary tract: Secondary | ICD-10-CM | POA: Diagnosis present

## 2022-11-12 DIAGNOSIS — R7989 Other specified abnormal findings of blood chemistry: Secondary | ICD-10-CM

## 2022-11-12 MED ORDER — GADOXETATE DISODIUM 0.25 MMOL/ML IV SOLN
7.0000 mL | Freq: Once | INTRAVENOUS | Status: AC | PRN
  Administered 2022-11-12: 7 mL via INTRAVENOUS

## 2022-11-15 ENCOUNTER — Other Ambulatory Visit: Payer: Self-pay | Admitting: Family Medicine

## 2022-11-15 DIAGNOSIS — M26609 Unspecified temporomandibular joint disorder, unspecified side: Secondary | ICD-10-CM

## 2022-11-15 NOTE — Telephone Encounter (Signed)
LOV: 07/28/22 Last fill: 10/14/22, 30 tablets 0 refill

## 2022-11-16 ENCOUNTER — Ambulatory Visit: Payer: PRIVATE HEALTH INSURANCE | Admitting: Psychiatry

## 2022-11-17 LAB — NASH FIBROSURE(R) PLUS
ALPHA 2-MACROGLOBULINS, QN: 136 mg/dL (ref 110–276)
ALT (SGPT) P5P: 113 IU/L — ABNORMAL HIGH (ref 0–40)
AST (SGOT) P5P: 78 IU/L — ABNORMAL HIGH (ref 0–40)
Apolipoprotein A-1: 146 mg/dL (ref 116–209)
Bilirubin, Total: 0.1 mg/dL (ref 0.0–1.2)
Cholesterol, Total: 163 mg/dL (ref 100–199)
Fibrosis Score: 0.04 (ref 0.00–0.21)
GGT: 141 IU/L — ABNORMAL HIGH (ref 0–60)
Glucose: 107 mg/dL — ABNORMAL HIGH (ref 70–99)
Haptoglobin: 300 mg/dL (ref 33–346)
NASH Score: 0.75 — ABNORMAL HIGH (ref 0.00–0.25)
Steatosis Score: 0.88 — ABNORMAL HIGH (ref 0.00–0.40)
Triglycerides: 168 mg/dL — ABNORMAL HIGH (ref 0–149)

## 2022-12-02 ENCOUNTER — Other Ambulatory Visit: Payer: Self-pay | Admitting: Family Medicine

## 2022-12-02 DIAGNOSIS — M26609 Unspecified temporomandibular joint disorder, unspecified side: Secondary | ICD-10-CM

## 2022-12-02 NOTE — Telephone Encounter (Signed)
LOV: 07/28/22 Last fill: 07/28/22, 90 tablets with 2 refills

## 2022-12-05 ENCOUNTER — Other Ambulatory Visit: Payer: Self-pay | Admitting: Family Medicine

## 2022-12-05 DIAGNOSIS — E039 Hypothyroidism, unspecified: Secondary | ICD-10-CM

## 2022-12-12 ENCOUNTER — Other Ambulatory Visit: Payer: Self-pay | Admitting: Family Medicine

## 2022-12-12 DIAGNOSIS — K219 Gastro-esophageal reflux disease without esophagitis: Secondary | ICD-10-CM

## 2022-12-14 ENCOUNTER — Other Ambulatory Visit: Payer: Self-pay | Admitting: Family Medicine

## 2022-12-14 DIAGNOSIS — M26609 Unspecified temporomandibular joint disorder, unspecified side: Secondary | ICD-10-CM

## 2022-12-15 NOTE — Telephone Encounter (Signed)
LOV: 07/28/22 Last fill: 11/15/22 30 tablets 0 refill

## 2023-01-04 ENCOUNTER — Other Ambulatory Visit: Payer: Self-pay | Admitting: Family Medicine

## 2023-01-04 DIAGNOSIS — M26609 Unspecified temporomandibular joint disorder, unspecified side: Secondary | ICD-10-CM

## 2023-01-04 NOTE — Telephone Encounter (Signed)
LOV: 07/28/22 Last fill: 12/02/22, 90 capsules 0 refill

## 2023-01-13 ENCOUNTER — Other Ambulatory Visit: Payer: Self-pay | Admitting: Family Medicine

## 2023-01-13 DIAGNOSIS — E785 Hyperlipidemia, unspecified: Secondary | ICD-10-CM

## 2023-01-13 NOTE — Telephone Encounter (Signed)
Would you like pt to continue on this or wait until upcoming visit on 2/23?

## 2023-01-26 ENCOUNTER — Other Ambulatory Visit: Payer: Self-pay | Admitting: Family Medicine

## 2023-01-26 DIAGNOSIS — M26609 Unspecified temporomandibular joint disorder, unspecified side: Secondary | ICD-10-CM

## 2023-01-27 ENCOUNTER — Encounter: Payer: Self-pay | Admitting: Gastroenterology

## 2023-01-28 ENCOUNTER — Ambulatory Visit: Payer: PRIVATE HEALTH INSURANCE | Admitting: Family Medicine

## 2023-01-31 ENCOUNTER — Other Ambulatory Visit: Payer: Self-pay | Admitting: Family Medicine

## 2023-01-31 DIAGNOSIS — E039 Hypothyroidism, unspecified: Secondary | ICD-10-CM

## 2023-02-03 ENCOUNTER — Other Ambulatory Visit: Payer: Self-pay | Admitting: Family Medicine

## 2023-02-03 DIAGNOSIS — M26609 Unspecified temporomandibular joint disorder, unspecified side: Secondary | ICD-10-CM

## 2023-02-03 NOTE — Telephone Encounter (Signed)
Are we continuing this medication?

## 2023-02-03 NOTE — Telephone Encounter (Signed)
Pt reports she has been taking this 3x daily for this and this is the only thing that works for her. She thinks the baclofen is also to help with this and is wondering why it has been reduced to BID instead of 3x daily as she was taking it? Pt prefers to keep her current medications because they are the only thing that works. She declines an ENT and neurology referral as she has already been through all of that and they landed on her just sticking with this medication and using a nightguard. She does not want to go to specialists again.

## 2023-02-04 ENCOUNTER — Ambulatory Visit: Payer: PRIVATE HEALTH INSURANCE | Admitting: Family Medicine

## 2023-02-08 ENCOUNTER — Encounter: Payer: Self-pay | Admitting: Gastroenterology

## 2023-02-14 ENCOUNTER — Encounter: Payer: Self-pay | Admitting: Psychiatry

## 2023-02-14 ENCOUNTER — Telehealth (INDEPENDENT_AMBULATORY_CARE_PROVIDER_SITE_OTHER): Payer: PRIVATE HEALTH INSURANCE | Admitting: Psychiatry

## 2023-02-14 DIAGNOSIS — F5101 Primary insomnia: Secondary | ICD-10-CM

## 2023-02-14 DIAGNOSIS — F411 Generalized anxiety disorder: Secondary | ICD-10-CM | POA: Diagnosis not present

## 2023-02-14 DIAGNOSIS — F331 Major depressive disorder, recurrent, moderate: Secondary | ICD-10-CM

## 2023-02-14 MED ORDER — CLONAZEPAM 1 MG PO TABS: ORAL_TABLET | ORAL | 1 refills | Status: DC

## 2023-02-14 NOTE — Progress Notes (Unsigned)
Cortina Halim [id number] 04/12/1966 57 y.o.  Virtual Visit via Video Note  I connected with pt @ on 02/14/23 at  4:15 PM EDT by a video enabled telemedicine application and verified that I am speaking with the correct person using two identifiers.   I discussed the limitations of evaluation and management by telemedicine and the availability of in person appointments. The patient expressed understanding and agreed to proceed.  I discussed the assessment and treatment plan with the patient. The patient was provided an opportunity to ask questions and all were answered. The patient agreed with the plan and demonstrated an understanding of the instructions.   The patient was advised to call back or seek an in-person evaluation if the symptoms worsen or if the condition fails to improve as anticipated.  I provided 30 minutes of non-face-to-face time during this encounter.  The patient was located at work.  The provider was located at home.   Thayer Headings, PMHNP   Subjective:   Patient ID:  Amy Mcintyre is a 57 y.o. (DOB [birth year]) female.  Chief Complaint:  Chief Complaint  Patient presents with   Depression   Anxiety    HPI Amy Mcintyre presents for follow-up of depression, anxiety, and insomina. She reports that overall she is "ok, not been my best lately." She reports that "a lot has happened" since last visit. She has been off [place] for at least 2 months and reports first decreasing dose and then stopping completely in January.   She reports that [employer] his house locally and moved to home at [employer] and she had to find a place to live quickly. Her mother told her that she could not come and stay with her short-term. She is renting a place from a friend and only has furniture in the bedroom. She reports that the house is not in a neighborhood that she likes and the house is not in the best condition. "I'm not happy with where I am living and how I am living." She reports that  [name] comes to see her for 10 days a month (2 weekends) and she goes to see him one weekend a month. She reports that she misses having Greg there in the evening to talking with and cook dinner with. She reports that [name] also informed her that he did not plan on adding her to his [employer] after he had previously suggested this. 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   She reports sad mood, "but I don't have a reason to be. I know logically I have a lot to be thankful for." She reports that she is crying more easily. She reports that she went to see her daughter last month and she "had a couple of good weeks" after this. She reports that prior to this one of her friends commented that she may want to go back on [place]. She reports that she has not slept well the last few nights and was sleeping ok previously. She reports some anxiety about retiring. She reports work-related anxiety. She denies any change in weight or appetite. She reports that concentration is ok except for days she feels tearful. Denies SI.   She told her supervisor today that she plans on retiring the end of May. She reports feeling "a loss... this is my identity and I don't know who I am outside of that." She  reports that her anxiety has been higher. She reports that she had a panic attack in response to work stress.   Klonopin last filled 02/04/23 x 3.  Meeting with therapist more often with increased stress.    Past Psychiatric Medication Trials: Paxil Prozac Sertraline Nortriptyline Wellbutrin- not helpful for depression.  Doxepin BuSpar Abilify-increased heart rate Rexulti-Helpful for mood and anxiety Seroquel-adverse effects Gabapentin- takes for jaw pain Lunesta-not effective Ambien- parasomnias Trazodone-ineffective Belsomra-worsening insomnia Klonopin  Review of Systems:  Review of Systems   Musculoskeletal:  Negative for gait problem.       Jaw pain  Neurological:  Negative for tremors.  Psychiatric/Behavioral:         Please refer to HPI    Has labs scheduled for 02/25/23.  Medications: I have reviewed the patient's current medications.  Current Outpatient Medications  Medication Sig Dispense Refill   baclofen (LIORESAL) 20 MG tablet TAKE 1 TABLET BY MOUTH TWICE DAILY AS NEEDED FOR MUSCLE SPASMS 30 tablet 0   atorvastatin (LIPITOR) 40 MG tablet TAKE 1 TABLET BY MOUTH AT BEDTIME 90 tablet 0   b complex vitamins capsule Take 1 capsule by mouth daily.     cholecalciferol (VITAMIN D3) 25 MCG (1000 UNIT) tablet Take 1,000 Units by mouth daily.     [START ON 03/04/2023] clonazePAM (KLONOPIN) 1 MG tablet TAKE 1/2 TO 1 (ONE-HALF TO ONE) TABLET BY MOUTH THREE TIMES DAILY AS NEEDED FOR PANIC ATTACK OR INSOMNIA 90 tablet 1   gabapentin (NEURONTIN) 300 MG capsule TAKE 1 CAPSULE BY MOUTH THREE TIMES DAILY 90 capsule 0   levothyroxine (SYNTHROID) 75 MCG tablet Take 1 tablet by mouth once daily 30 tablet 0   pantoprazole (PROTONIX) 40 MG tablet Take 1 tablet (40 mg total) by mouth daily. Follow-up appt due in Feb must see Vickie for more refills 30 tablet 0   sertraline (ZOLOFT) 100 MG tablet Take 2 tablets (200 mg total) by mouth daily. 180 tablet 1   No current facility-administered medications for this visit.    Medication Side Effects: None  Allergies:  Allergies  Allergen Reactions   Ambien [Zolpidem Tartrate] Other (See Comments)    Sleep walking   Erythromycin Swelling    Top lip swelled up.   Penicillins Swelling    Childhood reaction    Past Medical History:  Diagnosis Date   Allergy    Anxiety    Cataracts, bilateral    Colitis    Depression    Eosinophilic esophagitis    Fatty liver    Gastritis    GERD (gastroesophageal reflux disease)    Hyperlipidemia    Pre-diabetes    Sleep apnea    Thyroid disease    TMJ (dislocation of temporomandibular joint)      Family History  Problem Relation Age of Onset   Anxiety disorder Mother    Panic disorder Mother    OCD Mother    Depression Mother    Arthritis Mother    High Cholesterol Mother    Anxiety disorder Father    Depression Father    Colon polyps Father    Heart disease Father    High Cholesterol Father    High blood pressure Father    Drug abuse Brother    Asthma Brother    COPD Brother    Depression Brother    Arthritis Maternal Grandmother    Depression Maternal Grandfather    Depression Paternal Grandmother    Alcohol abuse Paternal Grandfather    Heart  disease Paternal Grandfather    Colon cancer Neg Hx    Esophageal cancer Neg Hx    Pancreatic cancer Neg Hx    Stomach cancer Neg Hx    Liver disease Neg Hx    Rectal cancer Neg Hx     Social History   Socioeconomic History   Marital status: Single    Spouse name: Not on file   Number of children: 1   Years of education: Not on file   Highest education level: Not on file  Occupational History   Occupation: MANAGER  Tobacco Use   Smoking status: Never    Passive exposure: Never   Smokeless tobacco: Never  Vaping Use   Vaping Use: Never used  Substance and Sexual Activity   Alcohol use: Yes    Comment: socially   Drug use: Never   Sexual activity: Not Currently  Other Topics Concern   Not on file  Social History Narrative   Not on file   Social Determinants of Health   Financial Resource Strain: Not on file  Food Insecurity: Not on file  Transportation Needs: Not on file  Physical Activity: Not on file  Stress: Not on file  Social Connections: Not on file  Intimate Partner Violence: Not on file    Past Medical History, Surgical history, Social history, and Family history were reviewed and updated as appropriate.   Please see review of systems for further details on the patient's review from today.   Objective:   Physical Exam:  LMP 02/04/2019   Physical Exam Neurological:     Mental  Status: She is alert and oriented to person, place, and time.     Cranial Nerves: No dysarthria.  Psychiatric:        Attention and Perception: Attention and perception normal.        Mood and Affect: Mood is anxious and depressed.        Speech: Speech normal.        Behavior: Behavior is cooperative.        Thought Content: Thought content normal. Thought content is not paranoid or delusional. Thought content does not include homicidal or suicidal ideation. Thought content does not include homicidal or suicidal plan.        Cognition and Memory: Cognition and memory normal.        Judgment: Judgment normal.     Comments: Insight intact     Lab Review:     Component Value Date/Time   NA 139 07/28/2022 0901   K 3.8 07/28/2022 0901   CL 99 07/28/2022 0901   CO2 24 07/28/2022 0901   GLUCOSE 114 (H) 07/28/2022 0901   BUN 15 07/28/2022 0901   CREATININE 0.71 07/28/2022 0901   CALCIUM 9.8 07/28/2022 0901   PROT 7.1 10/21/2022 0914   ALBUMIN 4.9 10/21/2022 0914   AST 73 (H) 10/21/2022 0914   ALT 120 (H) 10/21/2022 0914   ALT 39 (H) 04/18/2019 0821   ALKPHOS 97 10/21/2022 0914   BILITOT 0.2 10/21/2022 0914       Component Value Date/Time   WBC 5.8 07/28/2022 0901   RBC 4.21 07/28/2022 0901   HGB 13.0 07/28/2022 0901   HCT 38.9 07/28/2022 0901   PLT 225.0 07/28/2022 0901   MCV 92.2 07/28/2022 0901   MCH 30.6 07/12/2022 1157   MCHC 33.4 07/28/2022 0901   RDW 14.1 07/28/2022 0901   LYMPHSABS 1.9 07/28/2022 0901   MONOABS 0.4 07/28/2022 0901   EOSABS 0.1  07/28/2022 0901   BASOSABS 0.0 07/28/2022 0901    No results found for: "POCLITH", "LITHIUM"   No results found for: "PHENYTOIN", "PHENOBARB", "VALPROATE", "CBMZ"   .res Assessment: Plan:    Pt seen for 30 minutes and time spent discussing recent depression and anxiety symptoms. Discussed option of re-starting Rexulti even short-term to better control anxiety and depression until she retires. She reports that she  prefers not to re-start Rexulti at this time and would like to try to manage symptoms without medications to include seeing therapist more often, structuring her time away from work, etc. She reports that she also would like to wait and see if there has been any change in liver enzymes with upcoming labs.  Will continue current medications without changes.  Pt to follow-up with this provider in 4 weeks or sooner if clinically indicated.  Recommend continuing psychotherapy.  Patient advised to contact office with any questions, adverse effects, or acute worsening in signs and symptoms.   Amy Mcintyre was seen today for depression and anxiety.  Diagnoses and all orders for this visit:  Moderate episode of recurrent major depressive disorder (HCC)  Generalized anxiety disorder -     clonazePAM (KLONOPIN) 1 MG tablet; TAKE 1/2 TO 1 (ONE-HALF TO ONE) TABLET BY MOUTH THREE TIMES DAILY AS NEEDED FOR PANIC ATTACK OR INSOMNIA  Primary insomnia -     clonazePAM (KLONOPIN) 1 MG tablet; TAKE 1/2 TO 1 (ONE-HALF TO ONE) TABLET BY MOUTH THREE TIMES DAILY AS NEEDED FOR PANIC ATTACK OR INSOMNIA     Please see After Visit Summary for patient specific instructions.  Future Appointments  Date Time Provider [place]  02/25/2023  8:00 AM Henson, Vickie L, NP-C [facility] None    No orders of the defined types were placed in this encounter.     -------------------------------

## 2023-02-17 ENCOUNTER — Telehealth: Payer: Self-pay

## 2023-02-17 DIAGNOSIS — R7989 Other specified abnormal findings of blood chemistry: Secondary | ICD-10-CM

## 2023-02-17 NOTE — Telephone Encounter (Signed)
The pt has been advised that she is due for labs. Order placed.

## 2023-02-17 NOTE — Telephone Encounter (Signed)
-----   Message from Carl Best, RN sent at 11/18/2022  9:19 AM EST ----- Repeat LFT's (3 month follow up). Refer to result note 11/03/22. Order needs to be placed.

## 2023-02-25 ENCOUNTER — Ambulatory Visit (INDEPENDENT_AMBULATORY_CARE_PROVIDER_SITE_OTHER): Payer: PRIVATE HEALTH INSURANCE | Admitting: Family Medicine

## 2023-02-25 ENCOUNTER — Encounter: Payer: Self-pay | Admitting: Family Medicine

## 2023-02-25 ENCOUNTER — Other Ambulatory Visit (INDEPENDENT_AMBULATORY_CARE_PROVIDER_SITE_OTHER): Payer: PRIVATE HEALTH INSURANCE

## 2023-02-25 VITALS — BP 136/88 | HR 105 | Temp 97.7°F | Ht 59.0 in | Wt 154.0 lb

## 2023-02-25 DIAGNOSIS — L853 Xerosis cutis: Secondary | ICD-10-CM

## 2023-02-25 DIAGNOSIS — E785 Hyperlipidemia, unspecified: Secondary | ICD-10-CM

## 2023-02-25 DIAGNOSIS — F32A Anxiety disorder, unspecified: Secondary | ICD-10-CM

## 2023-02-25 DIAGNOSIS — R7303 Prediabetes: Secondary | ICD-10-CM

## 2023-02-25 DIAGNOSIS — M26609 Unspecified temporomandibular joint disorder, unspecified side: Secondary | ICD-10-CM

## 2023-02-25 DIAGNOSIS — R7989 Other specified abnormal findings of blood chemistry: Secondary | ICD-10-CM | POA: Diagnosis not present

## 2023-02-25 DIAGNOSIS — F419 Anxiety disorder, unspecified: Secondary | ICD-10-CM

## 2023-02-25 DIAGNOSIS — E039 Hypothyroidism, unspecified: Secondary | ICD-10-CM | POA: Diagnosis not present

## 2023-02-25 DIAGNOSIS — E559 Vitamin D deficiency, unspecified: Secondary | ICD-10-CM

## 2023-02-25 LAB — BASIC METABOLIC PANEL
BUN: 13 mg/dL (ref 6–23)
CO2: 27 mEq/L (ref 19–32)
Calcium: 9.5 mg/dL (ref 8.4–10.5)
Chloride: 104 mEq/L (ref 96–112)
Creatinine, Ser: 0.93 mg/dL (ref 0.40–1.20)
GFR: 68.34 mL/min (ref >60.00)
Glucose, Bld: 125 mg/dL — ABNORMAL HIGH (ref 70–99)
Potassium: 4.9 mEq/L (ref 3.5–5.1)
Sodium: 142 mEq/L (ref 135–145)

## 2023-02-25 LAB — LIPID PANEL
Cholesterol: 254 mg/dL — ABNORMAL HIGH (ref 0–200)
HDL: 67 mg/dL (ref >39.00)
LDL Cholesterol: 163 mg/dL — ABNORMAL HIGH (ref 0–99)
NonHDL: 186.66
Total CHOL/HDL Ratio: 4
Triglycerides: 118 mg/dL (ref 0.0–149.0)
VLDL: 23.6 mg/dL (ref 0.0–40.0)

## 2023-02-25 LAB — TSH: TSH: 3.22 u[IU]/mL (ref 0.35–5.50)

## 2023-02-25 LAB — CBC WITH DIFFERENTIAL/PLATELET
Basophils Absolute: 0 10*3/uL (ref 0.0–0.1)
Basophils Relative: 0.3 % (ref 0.0–3.0)
Eosinophils Absolute: 0 10*3/uL (ref 0.0–0.7)
Eosinophils Relative: 0.9 % (ref 0.0–5.0)
HCT: 41.8 % (ref 36.0–46.0)
Hemoglobin: 13.7 g/dL (ref 12.0–15.0)
Lymphocytes Relative: 19.8 % (ref 12.0–46.0)
Lymphs Abs: 1 10*3/uL (ref 0.7–4.0)
MCHC: 32.8 g/dL (ref 30.0–36.0)
MCV: 93.4 fl (ref 78.0–100.0)
Monocytes Absolute: 0.4 10*3/uL (ref 0.1–1.0)
Monocytes Relative: 7 % (ref 3.0–12.0)
Neutro Abs: 3.6 10*3/uL (ref 1.4–7.7)
Neutrophils Relative %: 72 % (ref 43.0–77.0)
Platelets: 201 10*3/uL (ref 150.0–400.0)
RBC: 4.48 Mil/uL (ref 3.87–5.11)
RDW: 14 % (ref 11.5–15.5)
WBC: 5 10*3/uL (ref 4.0–10.5)

## 2023-02-25 LAB — HEPATIC FUNCTION PANEL
ALT: 129 U/L — ABNORMAL HIGH (ref 0–35)
AST: 139 U/L — ABNORMAL HIGH (ref 0–37)
Albumin: 4.6 g/dL (ref 3.5–5.2)
Alkaline Phosphatase: 94 U/L (ref 39–117)
Bilirubin, Direct: 0.1 mg/dL (ref 0.0–0.3)
Total Bilirubin: 0.4 mg/dL (ref 0.2–1.2)
Total Protein: 7.2 g/dL (ref 6.0–8.3)

## 2023-02-25 LAB — HEMOGLOBIN A1C: Hgb A1c MFr Bld: 6.3 % (ref 4.6–6.5)

## 2023-02-25 LAB — VITAMIN D 25 HYDROXY (VIT D DEFICIENCY, FRACTURES): VITD: 90.39 ng/mL (ref 30.00–100.00)

## 2023-02-25 NOTE — Patient Instructions (Signed)
Please go downstairs for labs before you leave.  You will hear from [facility] neurology to schedule a visit.

## 2023-02-25 NOTE — Progress Notes (Unsigned)
Subjective:     Patient ID: Amy Mcintyre, female    DOB: 02-Sep-1966, 57 y.o.   MRN: [id number]  Chief Complaint  Patient presents with   Medical Management of Chronic Issues    6 month f/u  Cut on toe she wants to know what she can put on it    HPI Patient is in today for follow up on chronic health.   Other providers:  [facility] for GAD, depression and insomnia.  GI- Dr. Tarri Glenn for chronic diarrhea and fatty liver  Eye doctor Dentist  Dermatologist - Dr. Quentin Cornwall in [place].  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 States she needs gabapentin 300 mg daily and baclofen 20 mg daily. States her pain is severe if she does not take these medications.  States she has been seen at the [employer] [facility] of maxillofacial. Wears a mouthguard.   Reports seeing a chiropractor, PT, neurologist, ENT and pain management clinic in the past.  She has not seen anyone in 15 years or so.   Left great toe with dry skin and a crack.  Dry heels  Health Maintenance Due  Topic Date Due   HIV Screening  Never done    Past Medical History:  Diagnosis Date   Allergy    Anxiety    Cataracts, bilateral    Colitis    Depression    Eosinophilic esophagitis    Fatty liver    Gastritis    GERD (gastroesophageal reflux disease)    Hyperlipidemia    Pre-diabetes    Sleep apnea    Thyroid disease    TMJ (dislocation of temporomandibular joint)     Past Surgical History:  Procedure Laterality Date   BREAST ENHANCEMENT SURGERY     CARPAL TUNNEL RELEASE Right 07/12/2022   Procedure: RIGHT CARPAL TUNNEL RELEASE;  Surgeon: Sherilyn Cooter, MD;  Location: [facility];  Service: Orthopedics;  Laterality: Right;   CESAREAN SECTION     NASAL SINUS SURGERY     tummy tuck     WRIST SURGERY Left     Family History  Problem Relation Age of Onset   Anxiety disorder Mother    Panic disorder Mother    OCD Mother    Depression Mother    Arthritis  Mother    High Cholesterol Mother    Anxiety disorder Father    Depression Father    Colon polyps Father    Heart disease Father    High Cholesterol Father    High blood pressure Father    Drug abuse Brother    Asthma Brother    COPD Brother    Depression Brother    Arthritis Maternal Grandmother    Depression Maternal Grandfather    Depression Paternal Grandmother    Alcohol abuse Paternal Grandfather    Heart disease Paternal Grandfather    Colon cancer Neg Hx    Esophageal cancer Neg Hx    Pancreatic cancer Neg Hx    Stomach cancer Neg Hx    Liver disease Neg Hx    Rectal cancer Neg Hx     Social History   Socioeconomic History   Marital status: Single    Spouse name: Not on file   Number of children: 1   Years of education: Not on file   Highest education level: Not on file  Occupational History   Occupation: MANAGER  Tobacco Use   Smoking status: Never  Passive exposure: Never   Smokeless tobacco: Never  Vaping Use   Vaping Use: Never used  Substance and Sexual Activity   Alcohol use: Yes    Comment: socially   Drug use: Never   Sexual activity: Not Currently  Other Topics Concern   Not on file  Social History Narrative   Not on file   Social Determinants of Health   Financial Resource Strain: Not on file  Food Insecurity: Not on file  Transportation Needs: Not on file  Physical Activity: Not on file  Stress: Not on file  Social Connections: Not on file  Intimate Partner Violence: Not on file    Outpatient Medications Prior to Visit  Medication Sig Dispense Refill   atorvastatin (LIPITOR) 40 MG tablet TAKE 1 TABLET BY MOUTH AT BEDTIME 90 tablet 0   b complex vitamins capsule Take 1 capsule by mouth daily.     baclofen (LIORESAL) 20 MG tablet TAKE 1 TABLET BY MOUTH TWICE DAILY AS NEEDED FOR MUSCLE SPASMS 30 tablet 0   cholecalciferol (VITAMIN D3) 25 MCG (1000 UNIT) tablet Take 1,000 Units by mouth daily.     [START ON 03/04/2023] clonazePAM  (KLONOPIN) 1 MG tablet TAKE 1/2 TO 1 (ONE-HALF TO ONE) TABLET BY MOUTH THREE TIMES DAILY AS NEEDED FOR PANIC ATTACK OR INSOMNIA 90 tablet 1   gabapentin (NEURONTIN) 300 MG capsule TAKE 1 CAPSULE BY MOUTH THREE TIMES DAILY 90 capsule 0   levothyroxine (SYNTHROID) 75 MCG tablet Take 1 tablet by mouth once daily 30 tablet 0   pantoprazole (PROTONIX) 40 MG tablet Take 1 tablet (40 mg total) by mouth daily. Follow-up appt due in Feb must see Esaias Cleavenger for more refills 30 tablet 0   sertraline (ZOLOFT) 100 MG tablet Take 2 tablets (200 mg total) by mouth daily. 180 tablet 1   No facility-administered medications prior to visit.    Allergies  Allergen Reactions   Ambien [Zolpidem Tartrate] Other (See Comments)    Sleep walking   Erythromycin Swelling    Top lip swelled up.   Penicillins Swelling    Childhood reaction    Review of Systems  Constitutional:  Negative for chills and fever.  Respiratory:  Negative for shortness of breath.   Cardiovascular:  Negative for chest pain, palpitations and leg swelling.  Gastrointestinal:  Negative for abdominal pain, constipation, diarrhea, nausea and vomiting.  Genitourinary:  Negative for dysuria, frequency and urgency.  Neurological:  Negative for dizziness.       Objective:    Physical Exam Constitutional:      General: She is not in acute distress.    Appearance: She is not ill-appearing.  Eyes:     Extraocular Movements: Extraocular movements intact.     Conjunctiva/sclera: Conjunctivae normal.  Cardiovascular:     Rate and Rhythm: Normal rate.  Pulmonary:     Effort: Pulmonary effort is normal.  Musculoskeletal:     Cervical back: Normal range of motion and neck supple.  Skin:    General: Skin is warm and dry.  Neurological:     General: No focal deficit present.     Mental Status: She is alert and oriented to person, place, and time.  Psychiatric:        Mood and Affect: Mood normal.        Behavior: Behavior normal.         Thought Content: Thought content normal.     BP 136/88 (BP Location: Left Arm, Patient Position: Sitting, Cuff Size:  Large)   Pulse (!) 105   Temp 97.7 F (36.5 C) (Temporal)   Ht 4\' 11"  (1.499 m)   Wt 154 lb (69.9 kg)   LMP 02/04/2019   SpO2 95%   BMI 31.10 kg/m  Wt Readings from Last 3 Encounters:  02/25/23 154 lb (69.9 kg)  11/03/22 159 lb (72.1 kg)  07/28/22 161 lb (73 kg)       Assessment & Plan:   Problem List Items Addressed This Visit       Endocrine   Hypothyroidism - Primary    Continue medication at current dose. Check TSH and follow up      Relevant Orders   CBC with Differential/Platelet (Completed)   Basic metabolic panel (Completed)   TSH (Completed)     Musculoskeletal and Integument   Dry skin    Recommend using a intensive therapy moisturizer and wearing socks to bed.       TMJ dysfunction    Hx of seeing several specialists in [place]. No evaluation in 15 years. Requiring gabapentin and baclofen tid for pain management. Referral to neurology for evaluation and recommendations.       Relevant Orders   Ambulatory referral to Neurology     Other   Anxiety and depression    Followed by mental health provider       Hyperlipidemia    Check lipid panel.  Continue statin therapy and adjust dose as appropriate.      Relevant Orders   Lipid panel (Completed)   Prediabetes     Recommend low sugar and carbohydrate diet and increasing physical activity as tolerated.  Did not tolerate metformin, thought diarrhea caused by medication follow up pending A1c       Relevant Orders   CBC with Differential/Platelet (Completed)   Basic metabolic panel (Completed)   Hemoglobin A1c (Completed)   Vitamin D deficiency    Check vitamin D level and adjust dose prn      Relevant Orders   VITAMIN D 25 Hydroxy (Vit-D Deficiency, Fractures) (Completed)    I am having Amy Mcintyre maintain her cholecalciferol, b complex vitamins, sertraline, pantoprazole,  atorvastatin, baclofen, levothyroxine, gabapentin, and clonazePAM.  No orders of the defined types were placed in this encounter.

## 2023-02-26 ENCOUNTER — Other Ambulatory Visit: Payer: Self-pay | Admitting: Family Medicine

## 2023-02-26 DIAGNOSIS — E039 Hypothyroidism, unspecified: Secondary | ICD-10-CM

## 2023-02-27 DIAGNOSIS — L853 Xerosis cutis: Secondary | ICD-10-CM | POA: Insufficient documentation

## 2023-02-27 DIAGNOSIS — F419 Anxiety disorder, unspecified: Secondary | ICD-10-CM | POA: Insufficient documentation

## 2023-02-27 DIAGNOSIS — F32A Depression, unspecified: Secondary | ICD-10-CM | POA: Insufficient documentation

## 2023-02-27 NOTE — Assessment & Plan Note (Signed)
Recommend using a intensive therapy moisturizer and wearing socks to bed.

## 2023-02-27 NOTE — Assessment & Plan Note (Signed)
Hx of seeing several specialists in [place]. No evaluation in 15 years. Requiring gabapentin and baclofen tid for pain management. Referral to neurology for evaluation and recommendations.

## 2023-02-27 NOTE — Progress Notes (Signed)
Please reach out to her and ask if she has missed any doses of atorvastatin recently. Her LDL is quite high if she is taking the atorvastatin 40 mg daily. Let me know. Blood sugars are still in prediabetes range which is fine and her vitamin D is 90 and getting close to being too high. I recommend reducing her vitamin D dose.

## 2023-02-27 NOTE — Assessment & Plan Note (Signed)
Followed by mental health provider

## 2023-02-27 NOTE — Assessment & Plan Note (Signed)
Continue medication at current dose. Check TSH and follow up

## 2023-02-27 NOTE — Assessment & Plan Note (Signed)
Check lipid panel.  Continue statin therapy and adjust dose as appropriate. 

## 2023-02-27 NOTE — Assessment & Plan Note (Signed)
Check vitamin D level and adjust dose prn

## 2023-02-27 NOTE — Assessment & Plan Note (Signed)
Recommend low sugar and carbohydrate diet and increasing physical activity as tolerated.  Did not tolerate metformin, thought diarrhea caused by medication follow up pending A1c

## 2023-02-28 ENCOUNTER — Other Ambulatory Visit: Payer: Self-pay | Admitting: Family Medicine

## 2023-02-28 DIAGNOSIS — M26609 Unspecified temporomandibular joint disorder, unspecified side: Secondary | ICD-10-CM

## 2023-03-01 ENCOUNTER — Other Ambulatory Visit: Payer: Self-pay

## 2023-03-01 DIAGNOSIS — R932 Abnormal findings on diagnostic imaging of liver and biliary tract: Secondary | ICD-10-CM

## 2023-03-01 DIAGNOSIS — R7989 Other specified abnormal findings of blood chemistry: Secondary | ICD-10-CM

## 2023-03-02 ENCOUNTER — Encounter: Payer: Self-pay | Admitting: Gastroenterology

## 2023-03-08 ENCOUNTER — Other Ambulatory Visit: Payer: Self-pay | Admitting: Family Medicine

## 2023-03-08 DIAGNOSIS — M26609 Unspecified temporomandibular joint disorder, unspecified side: Secondary | ICD-10-CM

## 2023-03-10 ENCOUNTER — Encounter: Payer: Self-pay | Admitting: Gastroenterology

## 2023-03-14 ENCOUNTER — Encounter: Payer: Self-pay | Admitting: Family Medicine

## 2023-03-16 ENCOUNTER — Other Ambulatory Visit: Payer: Self-pay | Admitting: Radiology

## 2023-03-16 ENCOUNTER — Encounter: Payer: Self-pay | Admitting: Internal Medicine

## 2023-03-16 DIAGNOSIS — R7989 Other specified abnormal findings of blood chemistry: Secondary | ICD-10-CM

## 2023-03-16 NOTE — Telephone Encounter (Signed)
FYI does not want another neurology eval if records are good enough

## 2023-03-17 ENCOUNTER — Encounter (HOSPITAL_COMMUNITY): Payer: Self-pay

## 2023-03-17 ENCOUNTER — Telehealth: Payer: PRIVATE HEALTH INSURANCE | Admitting: Psychiatry

## 2023-03-17 ENCOUNTER — Ambulatory Visit (HOSPITAL_COMMUNITY)
Admission: RE | Admit: 2023-03-17 | Discharge: 2023-03-17 | Disposition: A | Discharge status: Home / Self Care | Payer: PRIVATE HEALTH INSURANCE | Source: Ambulatory Visit | Attending: Gastroenterology | Admitting: Gastroenterology

## 2023-03-17 DIAGNOSIS — R932 Abnormal findings on diagnostic imaging of liver and biliary tract: Secondary | ICD-10-CM

## 2023-03-17 DIAGNOSIS — R7989 Other specified abnormal findings of blood chemistry: Secondary | ICD-10-CM | POA: Diagnosis present

## 2023-03-17 LAB — COMPREHENSIVE METABOLIC PANEL
ALT: 77 U/L — ABNORMAL HIGH (ref 0–44)
AST: 62 U/L — ABNORMAL HIGH (ref 15–41)
Albumin: 4.1 g/dL (ref 3.5–5.0)
Alkaline Phosphatase: 78 U/L (ref 38–126)
Anion gap: 8 (ref 5–15)
BUN: 12 mg/dL (ref 6–20)
CO2: 26 mmol/L (ref 22–32)
Calcium: 9.3 mg/dL (ref 8.9–10.3)
Chloride: 103 mmol/L (ref 98–111)
Creatinine, Ser: 0.77 mg/dL (ref 0.44–1.00)
GFR, Estimated: >60 mL/min (ref >60)
Glucose, Bld: 105 mg/dL — ABNORMAL HIGH (ref 70–99)
Potassium: 4.2 mmol/L (ref 3.5–5.1)
Sodium: 137 mmol/L (ref 135–145)
Total Bilirubin: 0.6 mg/dL (ref 0.3–1.2)
Total Protein: 7.1 g/dL (ref 6.5–8.1)

## 2023-03-17 LAB — CBC WITH DIFFERENTIAL/PLATELET
Abs Immature Granulocytes: 0.01 10*3/uL (ref 0.00–0.07)
Basophils Absolute: 0 10*3/uL (ref 0.0–0.1)
Basophils Relative: 1 %
Eosinophils Absolute: 0.1 10*3/uL (ref 0.0–0.5)
Eosinophils Relative: 1 %
HCT: 37.4 % (ref 36.0–46.0)
Hemoglobin: 12.2 g/dL (ref 12.0–15.0)
Immature Granulocytes: 0 %
Lymphocytes Relative: 34 %
Lymphs Abs: 1.4 10*3/uL (ref 0.7–4.0)
MCH: 30.6 pg (ref 26.0–34.0)
MCHC: 32.6 g/dL (ref 30.0–36.0)
MCV: 93.7 fL (ref 80.0–100.0)
Monocytes Absolute: 0.3 10*3/uL (ref 0.1–1.0)
Monocytes Relative: 8 %
Neutro Abs: 2.3 10*3/uL (ref 1.7–7.7)
Neutrophils Relative %: 56 %
Platelets: 180 10*3/uL (ref 150–400)
RBC: 3.99 MIL/uL (ref 3.87–5.11)
RDW: 12.9 % (ref 11.5–15.5)
WBC: 4 10*3/uL (ref 4.0–10.5)
nRBC: 0 % (ref 0.0–0.2)

## 2023-03-17 LAB — PROTIME-INR
INR: 0.9 (ref 0.8–1.2)
Prothrombin Time: 11.9 seconds (ref 11.4–15.2)

## 2023-03-17 MED ORDER — LIDOCAINE HCL 1 % IJ SOLN
INTRAMUSCULAR | Status: AC
  Filled 2023-03-17: qty 20

## 2023-03-17 MED ORDER — SODIUM CHLORIDE 0.9 % IV SOLN: INTRAVENOUS | Status: DC

## 2023-03-17 MED ORDER — MIDAZOLAM HCL 2 MG/2ML IJ SOLN
INTRAMUSCULAR | Status: AC | PRN
  Administered 2023-03-17 (×2): 1.5 mg via INTRAVENOUS
  Administered 2023-03-17: .5 mg via INTRAVENOUS

## 2023-03-17 MED ORDER — MIDAZOLAM HCL 2 MG/2ML IJ SOLN
INTRAMUSCULAR | Status: AC
  Filled 2023-03-17: qty 2

## 2023-03-17 MED ORDER — FENTANYL CITRATE (PF) 100 MCG/2ML IJ SOLN
INTRAMUSCULAR | Status: AC
  Filled 2023-03-17: qty 2

## 2023-03-17 MED ORDER — FENTANYL CITRATE (PF) 100 MCG/2ML IJ SOLN
INTRAMUSCULAR | Status: AC | PRN
  Administered 2023-03-17: 25 ug via INTRAVENOUS
  Administered 2023-03-17 (×2): 75 ug via INTRAVENOUS

## 2023-03-17 MED ORDER — GELATIN ABSORBABLE 12-7 MM EX MISC
CUTANEOUS | Status: AC
  Filled 2023-03-17: qty 1

## 2023-03-17 NOTE — Sedation Documentation (Signed)
Biopsy  1409: 1 1410: 2 1410:3 1411:4

## 2023-03-17 NOTE — Procedures (Signed)
Pre Procedure Dx: Elevated LFTs °Post Procedural Dx: Same ° °Technically successful US guided biopsy of right lobe of the liver. ° °EBL: Trace ° °No immediate complications.  ° °Jay Genola Yuille, MD °Pager #: [phone] ° ° °

## 2023-03-17 NOTE — Discharge Instructions (Signed)
Liver Biopsy, Care After  May remove dressing or bandaid and shower tomorrow.  Keep site clean and dry. Replace with clean dressing or bandaid as necessary.   Urgent needs - Interventional Radiology clinic [phone]  After a liver biopsy, it is common to have these things in the area where the biopsy was done. You may: Have pain. Feel sore. Have bruising. You may also feel tired for a few days. Follow these instructions at home: Medicines Take over-the-counter and prescription medicines only as told by your doctor. If you were prescribed an antibiotic medicine, take it as told by your doctor. Do not stop taking the antibiotic, even if you start to feel better. Do not take medicines that may thin your blood. These medicines include aspirin and ibuprofen. Take them only if your doctor tells you to. If told, take steps to prevent problems with pooping (constipation). You may need to: Drink enough fluid to keep your pee (urine) pale yellow. Take medicines. You will be told what medicines to take. Eat foods that are high in fiber. These include beans, whole grains, and fresh fruits and vegetables. Limit foods that are high in fat and sugar. These include fried or sweet foods. Ask your doctor if you should avoid driving or using machines while you are taking your medicine. Caring for your incision Follow instructions from your doctor about how to take care of your cut from surgery (incisions). Make sure you: Wash your hands with soap and water for at least 20 seconds before and after you change your bandage. If you cannot use soap and water, use hand sanitizer. Change your bandage. Leavestitches or skin glue in place for at least two weeks. Leave tape strips alone unless you are told to take them off. You may trim the edges of the tape strips if they curl up. Check your incision every day for signs of infection. Check for: Redness, swelling, or more pain. Fluid or blood. Warmth. Pus or a  bad smell. Do not take baths, swim, or use a hot tub. Ask your doctor about taking showers or sponge baths. Activity Rest at home for 1-2 days, or as told by your doctor. Get up to take short walks every 1 to 2 hours. Ask for help if you feel weak or unsteady. Do not lift anything that is heavier than 10 lb (4.5 kg), or the limit that you are told. Do not play contact sports for 2 weeks after the procedure. Return to your normal activities as told by your doctor. Ask what activities are safe for you. General instructions Do not drink alcohol in the first week after the procedure. Plan to have a responsible adult care for you for the time you are told after you leave the hospital or clinic. This is important. It is up to you to get the results of your procedure. Ask how to get your results when they are ready. Keep all follow-up visits.   Contact a doctor if: You have more bleeding in your incision. Your incision swells, or is red and more painful. You have fluid that comes from your incision. You develop a rash. You have fever or chills. Get help right away if: You have swelling, bloating, or pain in your belly (abdomen). You get dizzy or faint. You vomit or you feel like vomiting. You have trouble breathing or feel short of breath. You have chest pain. You have problems talking or seeing. You have trouble with your balance or moving your arms or   legs. These symptoms may be an emergency. Get help right away. Call your local emergency services (911 in the [place] Do not wait to see if the symptoms will go away. Do not drive yourself to the hospital. Summary After the procedure, it is common to have pain, soreness, bruising, and tiredness. Your doctor will tell you how to take care of yourself at home. Change your bandage, take your medicines, and limit your activities as told by your doctor. Call your doctor if you have symptoms of infection. Get help right away if your belly swells,  your cut bleeds a lot, or you have trouble talking or breathing. This information is not intended to replace advice given to you by your health care provider. Make sure you discuss any questions you have with your healthcare provider. Document Revised: 10/06/2020 Document Reviewed: 10/06/2020 Elsevier Patient Education  2022 [employer]  Moderate Conscious Sedation, Adult, Care After This sheet gives you information about how to care for yourself after your procedure. Your health care provider may also give you more specific instructions. If you have problems or questions, contact your health careprovider. What can I expect after the procedure? After the procedure, it is common to have: Sleepiness for several hours. Impaired judgment for several hours. Difficulty with balance. Vomiting if you eat too soon. Follow these instructions at home: For the time period you were told by your health care provider: Rest. Do not participate in activities where you could fall or become injured. Do not drive or use machinery. Do not drink alcohol. Do not take sleeping pills or medicines that cause drowsiness. Do not make important decisions or sign legal documents. Do not take care of children on your own. Eating and drinking  Follow the diet recommended by your health care provider. Drink enough fluid to keep your urine pale yellow. If you vomit: Drink water, juice, or soup when you can drink without vomiting. Make sure you have little or no nausea before eating solid foods.  General instructions Take over-the-counter and prescription medicines only as told by your health care provider. Have a responsible adult stay with you for the time you are told. It is important to have someone help care for you until you are awake and alert. Do not smoke. Keep all follow-up visits as told by your health care provider. This is important. Contact a health care provider if: You are still sleepy or having  trouble with balance after 24 hours. You feel light-headed. You keep feeling nauseous or you keep vomiting. You develop a rash. You have a fever. You have redness or swelling around the IV site. Get help right away if: You have trouble breathing. You have new-onset confusion at home. Summary After the procedure, it is common to feel sleepy, have impaired judgment, or feel nauseous if you eat too soon. Rest after you get home. Know the things you should not do after the procedure. Follow the diet recommended by your health care provider and drink enough fluid to keep your urine pale yellow. Get help right away if you have trouble breathing or new-onset confusion at home. This information is not intended to replace advice given to you by your health care provider. Make sure you discuss any questions you have with your healthcare provider. Document Revised: 03/21/2020 Document Reviewed: 10/18/2019 Elsevier Patient Education  2022 Elsevier Inc.       

## 2023-03-17 NOTE — Consult Note (Signed)
Chief Complaint: Patient was seen in consultation today for image guided liver biopsy  Referring Physician(s): Beavers,Kimberly  Supervising Physician: Simonne Come  Patient Status: [facility] - Out-pt  History of Present Illness: Amy Mcintyre is a 57 y.o. female with PMH sig for anxiety/depression, fatty liver/NASH, esophagitis/gastritis, GERD, hyperlipidemia, prediabetes, sleep apnea, IBS-D, hypothyroidism who presents now with persistently elevated LFTs.  MRI of the abdomen performed on 11/14/2022 revealed:   1. Stable appearance of enhancing lesion within segment 7 of the liver. Imaging findings are compatible with a benign abnormality, favor small adenoma. 2. Severe hepatic steatosis.   Patient is scheduled today for image guided liver biopsy for further evaluation.    Past Medical History:  Diagnosis Date   Allergy    Anxiety    Cataracts, bilateral    Colitis    Depression    Eosinophilic esophagitis    Fatty liver    Gastritis    GERD (gastroesophageal reflux disease)    Hyperlipidemia    Pre-diabetes    Sleep apnea    Thyroid disease    TMJ (dislocation of temporomandibular joint)     Past Surgical History:  Procedure Laterality Date   BREAST ENHANCEMENT SURGERY     CARPAL TUNNEL RELEASE Right 07/12/2022   Procedure: RIGHT CARPAL TUNNEL RELEASE;  Surgeon: Marlyne Beards, MD;  Location: [facility] OR;  Service: Orthopedics;  Laterality: Right;   CESAREAN SECTION     NASAL SINUS SURGERY     tummy tuck     WRIST SURGERY Left     Allergies: Ambien [zolpidem tartrate], Erythromycin, and Penicillins  Medications: Prior to Admission medications   Medication Sig Start Date End Date Taking? Authorizing Provider  atorvastatin (LIPITOR) 40 MG tablet TAKE 1 TABLET BY MOUTH AT BEDTIME 01/13/23   Henson, Vickie L, NP-C  b complex vitamins capsule Take 1 capsule by mouth daily.    [provider]  baclofen (LIORESAL) 20 MG tablet TAKE 1 TABLET BY MOUTH DAILY AS  NEEDED FOR MUSCLE SPASM 03/01/23   Henson, Vickie L, NP-C  cholecalciferol (VITAMIN D3) 25 MCG (1000 UNIT) tablet Take 1,000 Units by mouth daily.    [provider]  clonazePAM (KLONOPIN) 1 MG tablet TAKE 1/2 TO 1 (ONE-HALF TO ONE) TABLET BY MOUTH THREE TIMES DAILY AS NEEDED FOR PANIC ATTACK OR INSOMNIA 03/04/23   Corie Chiquito, PMHNP  gabapentin (NEURONTIN) 300 MG capsule TAKE 1 CAPSULE BY MOUTH THREE TIMES DAILY 03/09/23   Suezanne Jacquet, Vickie L, NP-C  levothyroxine (SYNTHROID) 75 MCG tablet Take 1 tablet by mouth once daily 02/28/23   Henson, Vickie L, NP-C  pantoprazole (PROTONIX) 40 MG tablet Take 1 tablet (40 mg total) by mouth daily. Follow-up appt due in Feb must see Vickie for more refills 12/13/22   Hetty Blend L, NP-C  sertraline (ZOLOFT) 100 MG tablet Take 2 tablets (200 mg total) by mouth daily. 11/08/22 02/25/23  Corie Chiquito, PMHNP     Family History  Problem Relation Age of Onset   Anxiety disorder Mother    Panic disorder Mother    OCD Mother    Depression Mother    Arthritis Mother    High Cholesterol Mother    Anxiety disorder Father    Depression Father    Colon polyps Father    Heart disease Father    High Cholesterol Father    High blood pressure Father    Drug abuse Brother    Asthma Brother    COPD Brother    Depression  Brother    Arthritis Maternal Grandmother    Depression Maternal Grandfather    Depression Paternal Grandmother    Alcohol abuse Paternal Grandfather    Heart disease Paternal Grandfather    Colon cancer Neg Hx    Esophageal cancer Neg Hx    Pancreatic cancer Neg Hx    Stomach cancer Neg Hx    Liver disease Neg Hx    Rectal cancer Neg Hx     Social History   Socioeconomic History   Marital status: Single    Spouse name: Not on file   Number of children: 1   Years of education: Not on file   Highest education level: Not on file  Occupational History   Occupation: MANAGER  Tobacco Use   Smoking status: Never    Passive  exposure: Never   Smokeless tobacco: Never  Vaping Use   Vaping Use: Never used  Substance and Sexual Activity   Alcohol use: Yes    Comment: socially   Drug use: Never   Sexual activity: Not Currently  Other Topics Concern   Not on file  Social History Narrative   Not on file   Social Determinants of Health   Financial Resource Strain: Not on file  Food Insecurity: Not on file  Transportation Needs: Not on file  Physical Activity: Not on file  Stress: Not on file  Social Connections: Not on file      Review of Systems:  denies fever, headache, chest pain, dyspnea, cough, abdominal/back pain, nausea, vomiting or bleeding  Vital Signs: BP (!) 141/83   Pulse 85   Temp 98.8 F (37.1 C) (Oral)   Resp 16   LMP 02/04/2019   SpO2 97%   Code Status: FULL CODE  Physical Exam: Awake, alert.  Chest clear to auscultation bilaterally.  Heart with regular rate and rhythm.  Abdomen soft, positive bowel sounds, nontender.  No lower extremity edema.  Imaging: No results found.  Labs:  CBC: Recent Labs    05/05/22 0855 07/12/22 1157 07/28/22 0901 02/25/23 0839  WBC 5.4 4.6 5.8 5.0  HGB 13.2 12.1 13.0 13.7  HCT 39.6 36.9 38.9 41.8  PLT 212.0 190 225.0 201.0    COAGS: No results for input(s): "INR", "APTT" in the last 8760 hours.  BMP: Recent Labs    05/05/22 0855 07/28/22 0901 02/25/23 0839  NA 139 139 142  K 4.0 3.8 4.9  CL 103 99 104  CO2 24 24 27   GLUCOSE 113* 114* 125*  BUN 11 15 13   CALCIUM 10.0 9.8 9.5  CREATININE 0.76 0.71 0.93    LIVER FUNCTION TESTS: Recent Labs    05/05/22 0855 07/28/22 0901 10/21/22 0914 02/25/23 0853  BILITOT 0.3 0.3 0.2 0.4  AST 48* 75* 73* 139*  ALT 75* 90* 120* 129*  ALKPHOS 88 83 97 94  PROT 7.4 7.6 7.1 7.2  ALBUMIN 4.6 4.7 4.9 4.6    TUMOR MARKERS: Recent Labs    11/03/22 0940  AFPTM 2.9    Assessment and Plan: 57 y.o. female with PMH sig for anxiety/depression, fatty liver/NASH, esophagitis/gastritis,  GERD, hyperlipidemia, prediabetes, sleep apnea, IBS-D, hypothyroidism who presents now with persistently elevated LFTs.  MRI of the abdomen performed on 11/14/2022 revealed:   1. Stable appearance of enhancing lesion within segment 7 of the liver. Imaging findings are compatible with a benign abnormality, favor small adenoma. 2. Severe hepatic steatosis.   Patient is scheduled today for image guided liver biopsy for further evaluation.Risks and  benefits of procedure was discussed with the patient  including, but not limited to bleeding, infection, damage to adjacent structures or low yield requiring additional tests.  All of the questions were answered and there is agreement to proceed.  Consent signed and in chart.  LABS PENDING  Thank you for this interesting consult.  I greatly enjoyed meeting Amy Mcintyre and look forward to participating in their care.  A copy of this report was sent to the requesting provider on this date.  Electronically Signed: D. Jeananne RamaKevin Jalynn Waddell, PA-C 03/17/2023, 11:39 AM   I spent a total of  25 minutes   in face to face in clinical consultation, greater than 50% of which was counseling/coordinating care for image guided liver biopsy

## 2023-03-18 LAB — SURGICAL PATHOLOGY

## 2023-03-21 ENCOUNTER — Encounter: Payer: Self-pay | Admitting: Psychiatry

## 2023-03-21 ENCOUNTER — Encounter: Payer: Self-pay | Admitting: Internal Medicine

## 2023-03-21 ENCOUNTER — Telehealth (INDEPENDENT_AMBULATORY_CARE_PROVIDER_SITE_OTHER): Payer: PRIVATE HEALTH INSURANCE | Admitting: Psychiatry

## 2023-03-21 DIAGNOSIS — F411 Generalized anxiety disorder: Secondary | ICD-10-CM | POA: Diagnosis not present

## 2023-03-21 DIAGNOSIS — F3342 Major depressive disorder, recurrent, in full remission: Secondary | ICD-10-CM

## 2023-03-21 DIAGNOSIS — F5101 Primary insomnia: Secondary | ICD-10-CM

## 2023-03-21 DIAGNOSIS — F330 Major depressive disorder, recurrent, mild: Secondary | ICD-10-CM

## 2023-03-21 MED ORDER — SERTRALINE HCL 100 MG PO TABS
200.00 mg | ORAL_TABLET | Freq: Every day | ORAL | 1 refills | Status: DC
Start: 2023-03-21 — End: 2023-06-14

## 2023-03-21 NOTE — Progress Notes (Unsigned)
Kaite Snyder [id number] 17-Aug-1966 57 y.o.  Virtual Visit via Video Note  I connected with pt @ on 03/21/23 at  2:30 PM EDT by a video enabled telemedicine application and verified that I am speaking with the correct person using two identifiers.   I discussed the limitations of evaluation and management by telemedicine and the availability of in person appointments. The patient expressed understanding and agreed to proceed.  I discussed the assessment and treatment plan with the patient. The patient was provided an opportunity to ask questions and all were answered. The patient agreed with the plan and demonstrated an understanding of the instructions.   The patient was advised to call back or seek an in-person evaluation if the symptoms worsen or if the condition fails to improve as anticipated.  I provided 30 minutes of non-face-to-face time during this encounter.  The patient was located at work in [place], [place].  The provider was located at [facility].   Corie Chiquito, PMHNP   Subjective:   Patient ID:  Amy Mcintyre is a 57 y.o. (DOB 02-01-1966) female.  Chief Complaint:  Chief Complaint  Patient presents with   Anxiety   Depression    Anxiety    Depression        Past medical history includes anxiety.    Amy Mcintyre presents for follow-up of anxiety, depression, and insomnia. She reports that her lab results indicated she needs a liver biopsy. She had liver biopsy and is awaiting results and this is causing her anxiety. She reports that she has anxiety about how this may affect retirement, COBRA, medical costs. She reports that she has been catastrophizing about health issues. She has some anxiety about retiring and not working. Reports worry and rumination. She has had a few panic attacks, mostly in response to health issues. She reports feeling sad at times. She does not notice sad mood when she is staying busy at work. "As long as I am occupied, I am ok."  Notices sad mood when she is not busy. She reports sleeping ok some nights, and other nights is not sleeping well- "it kind of depends on what's on my schedule." Slept better over the weekend and did not sleep well last night in anticipation of work today. Appetite has been "about the same." She reports that she is not eating much. Energy and motivation have been "fair." She notices her concentration is better when she is running a meeting and her mind wanders more when she is alone and time is less structured. She reports that she is reading more on the weekends. Denies SI.   She reports that she has been tearful at times.   Planning to retire in 7 weeks. Planning to move. Reports that these are 2 major adjustments. She plans to start healthier eating and cooking in retirement. She also plans to increase exercise. She reports that she may be training her replacement the last 2 weeks of her job.   Meeting with therapist more often.   Some worry about her significant other. Concerned about his ETOH use.    Klonopin last filled 03/18/23. She reports taking Klonopin 1 mg 2-3 times daily.    Past Psychiatric Medication Trials: Paxil Prozac Sertraline Nortriptyline Wellbutrin- not helpful for depression.  Doxepin BuSpar Abilify-increased heart rate Rexulti-Helpful for mood and anxiety Seroquel-adverse effects Gabapentin- takes for jaw pain Lunesta-not effective Ambien- parasomnias Trazodone-ineffective Belsomra-worsening insomnia Klonopin  Review of Systems:  Review of Systems  Gastrointestinal: Negative.   Musculoskeletal:  Negative for gait problem.  Neurological:  Negative for tremors.  Psychiatric/Behavioral:  Positive for depression.        Please refer to HPI    Medications: I have reviewed the patient's current medications.  Current Outpatient Medications  Medication Sig Dispense Refill   atorvastatin (LIPITOR) 40 MG tablet TAKE 1 TABLET BY MOUTH AT BEDTIME 90 tablet 0    b complex vitamins capsule Take 1 capsule by mouth daily.     baclofen (LIORESAL) 20 MG tablet TAKE 1 TABLET BY MOUTH DAILY AS NEEDED FOR MUSCLE SPASM 30 tablet 0   cholecalciferol (VITAMIN D3) 25 MCG (1000 UNIT) tablet Take 1,000 Units by mouth daily.     clonazePAM (KLONOPIN) 1 MG tablet TAKE 1/2 TO 1 (ONE-HALF TO ONE) TABLET BY MOUTH THREE TIMES DAILY AS NEEDED FOR PANIC ATTACK OR INSOMNIA 90 tablet 1   gabapentin (NEURONTIN) 300 MG capsule TAKE 1 CAPSULE BY MOUTH THREE TIMES DAILY 90 capsule 1   levothyroxine (SYNTHROID) 75 MCG tablet Take 1 tablet by mouth once daily 90 tablet 1   pantoprazole (PROTONIX) 40 MG tablet Take 1 tablet (40 mg total) by mouth daily. Follow-up appt due in Feb must see Vickie for more refills 30 tablet 0   sertraline (ZOLOFT) 100 MG tablet Take 2 tablets (200 mg total) by mouth daily. 180 tablet 1   No current facility-administered medications for this visit.    Medication Side Effects: None  Allergies:  Allergies  Allergen Reactions   Ambien [Zolpidem Tartrate] Other (See Comments)    Sleep walking   Erythromycin Swelling    Top lip swelled up.   Penicillins Swelling    Childhood reaction    Past Medical History:  Diagnosis Date   Allergy    Anxiety    Cataracts, bilateral    Colitis    Depression    Eosinophilic esophagitis    Fatty liver    Gastritis    GERD (gastroesophageal reflux disease)    Hyperlipidemia    Pre-diabetes    Sleep apnea    Thyroid disease    TMJ (dislocation of temporomandibular joint)     Family History  Problem Relation Age of Onset   Anxiety disorder Mother    Panic disorder Mother    OCD Mother    Depression Mother    Arthritis Mother    High Cholesterol Mother    Anxiety disorder Father    Depression Father    Colon polyps Father    Heart disease Father    High Cholesterol Father    High blood pressure Father    Drug abuse Brother    Asthma Brother    COPD Brother    Depression Brother     Arthritis Maternal Grandmother    Depression Maternal Grandfather    Depression Paternal Grandmother    Alcohol abuse Paternal Grandfather    Heart disease Paternal Grandfather    Colon cancer Neg Hx    Esophageal cancer Neg Hx    Pancreatic cancer Neg Hx    Stomach cancer Neg Hx    Liver disease Neg Hx    Rectal cancer Neg Hx     Social History   Socioeconomic History   Marital status: Single    Spouse name: Not on file   Number of children: 1   Years of education: Not on file   Highest education level: Not on file  Occupational History   Occupation: MANAGER  Tobacco Use   Smoking status: Never  Passive exposure: Never   Smokeless tobacco: Never  Vaping Use   Vaping Use: Never used  Substance and Sexual Activity   Alcohol use: Yes    Comment: socially   Drug use: Never   Sexual activity: Not Currently  Other Topics Concern   Not on file  Social History Narrative   Not on file   Social Determinants of Health   Financial Resource Strain: Not on file  Food Insecurity: Not on file  Transportation Needs: Not on file  Physical Activity: Not on file  Stress: Not on file  Social Connections: Not on file  Intimate Partner Violence: Not on file    Past Medical History, Surgical history, Social history, and Family history were reviewed and updated as appropriate.   Please see review of systems for further details on the patient's review from today.   Objective:   Physical Exam:  LMP 02/04/2019   Physical Exam Neurological:     Mental Status: She is alert and oriented to person, place, and time.     Cranial Nerves: No dysarthria.  Psychiatric:        Attention and Perception: Attention and perception normal.        Mood and Affect: Mood is anxious and depressed.        Speech: Speech normal.        Behavior: Behavior is cooperative.        Thought Content: Thought content normal. Thought content is not paranoid or delusional. Thought content does not include  homicidal or suicidal ideation. Thought content does not include homicidal or suicidal plan.        Cognition and Memory: Cognition and memory normal.        Judgment: Judgment normal.     Comments: Insight intact     Lab Review:     Component Value Date/Time   NA 137 03/17/2023 1152   K 4.2 03/17/2023 1152   CL 103 03/17/2023 1152   CO2 26 03/17/2023 1152   GLUCOSE 105 (H) 03/17/2023 1152   BUN 12 03/17/2023 1152   CREATININE 0.77 03/17/2023 1152   CALCIUM 9.3 03/17/2023 1152   PROT 7.1 03/17/2023 1152   PROT 7.1 10/21/2022 0914   ALBUMIN 4.1 03/17/2023 1152   ALBUMIN 4.9 10/21/2022 0914   AST 62 (H) 03/17/2023 1152   ALT 77 (H) 03/17/2023 1152   ALT 39 (H) 04/18/2019 0821   ALKPHOS 78 03/17/2023 1152   BILITOT 0.6 03/17/2023 1152   BILITOT 0.2 10/21/2022 0914   GFRNONAA >60 03/17/2023 1152       Component Value Date/Time   WBC 4.0 03/17/2023 1152   RBC 3.99 03/17/2023 1152   HGB 12.2 03/17/2023 1152   HCT 37.4 03/17/2023 1152   PLT 180 03/17/2023 1152   MCV 93.7 03/17/2023 1152   MCH 30.6 03/17/2023 1152   MCHC 32.6 03/17/2023 1152   RDW 12.9 03/17/2023 1152   LYMPHSABS 1.4 03/17/2023 1152   MONOABS 0.3 03/17/2023 1152   EOSABS 0.1 03/17/2023 1152   BASOSABS 0.0 03/17/2023 1152    No results found for: "POCLITH", "LITHIUM"   No results found for: "PHENYTOIN", "PHENOBARB", "VALPROATE", "CBMZ"   .res Assessment: Plan:   Time spent discussing anxiety in response to recent stressors and approaching retirement. She reports that she prefers not to change medications- "I'm managing ok." She anticipates some stressors resolving or changing in the near future with retirement and results from medical tests.  Discussed option to re-start Rexulti if symptoms  worsen, are interfering with function, or causing significant distress.  Discussed potential effects of current psychiatric medications on liver function and that Klonopin would be more likely to impact liver  function compared to Sertraline. Discussed considering gradual dose reduction in Klonopin after retirement and acute stressors improving.  Will continue Sertraline 200 mg po qd for anxiety and depression.  Continue Klonopin 1 mg 1/2-1 tab po TID prn anxiety, panic, or insomnia.  Recommend continuing psychotherapy.  Pt to follow-up in 3 months or sooner if clinically indicated.  Patient advised to contact office with any questions, adverse effects, or acute worsening in signs and symptoms.    Amy Mcintyre was seen today for anxiety and depression.  Diagnoses and all orders for this visit:  Generalized anxiety disorder -     sertraline (ZOLOFT) 100 MG tablet; Take 2 tablets (200 mg total) by mouth daily.  Mild episode of recurrent major depressive disorder -     sertraline (ZOLOFT) 100 MG tablet; Take 2 tablets (200 mg total) by mouth daily.  Primary insomnia     Please see After Visit Summary for patient specific instructions.  Future Appointments  Date Time Provider Department Center  04/12/2023 10:10 AM Iva Boop, MD [facility]    No orders of the defined types were placed in this encounter.     -------------------------------

## 2023-04-04 ENCOUNTER — Other Ambulatory Visit: Payer: Self-pay | Admitting: Family Medicine

## 2023-04-04 DIAGNOSIS — M26609 Unspecified temporomandibular joint disorder, unspecified side: Secondary | ICD-10-CM

## 2023-04-05 NOTE — Telephone Encounter (Signed)
Last fill: 03/01/23, 30 tablets 0 refill LOV: 02/25/23

## 2023-04-11 ENCOUNTER — Other Ambulatory Visit: Payer: Self-pay | Admitting: Family Medicine

## 2023-04-11 DIAGNOSIS — K219 Gastro-esophageal reflux disease without esophagitis: Secondary | ICD-10-CM

## 2023-04-12 ENCOUNTER — Ambulatory Visit (INDEPENDENT_AMBULATORY_CARE_PROVIDER_SITE_OTHER): Payer: PRIVATE HEALTH INSURANCE | Admitting: Internal Medicine

## 2023-04-12 ENCOUNTER — Encounter: Payer: Self-pay | Admitting: Internal Medicine

## 2023-04-12 VITALS — BP 124/70 | HR 91 | Ht 59.0 in | Wt 154.8 lb

## 2023-04-12 DIAGNOSIS — K760 Fatty (change of) liver, not elsewhere classified: Secondary | ICD-10-CM

## 2023-04-12 DIAGNOSIS — R7303 Prediabetes: Secondary | ICD-10-CM

## 2023-04-12 DIAGNOSIS — R748 Abnormal levels of other serum enzymes: Secondary | ICD-10-CM

## 2023-04-12 NOTE — Progress Notes (Signed)
Amy Mcintyre 57 y.o. [birth year] [id number]  Assessment & Plan:   Encounter Diagnoses  Name Primary?   NAFLD (nonalcoholic fatty liver disease) Yes   Abnormal transaminases    Prediabetes     I have given her several handouts on lower carbohydrate eating and have recommended this.  The first thing to do is to try to eliminate the sugary beverages in my opinion.  Then she can work on diet and lifestyle.  Hopefully retirement will treat her well and reduce stress and allow for better sleep.  She is moving to [facility] and she should establish with GI there.  I think she could probably wait a year before being seen and perhaps if she normalizes her LFTs then she might not even need to see GI for her liver issues.  Subjective:   Chief Complaint: Review liver biopsy fatty liver  HPI 57 year old white woman previously followed by Amy Mcintyre with a negative serologic workup suspected metabolic associated liver disease i.e. fatty liver.  She had a liver biopsy recently.  SURGICAL PATHOLOGY CASE: [id number] PATIENT: Amy Mcintyre Surgical Pathology Report     Clinical History: Elevated LFTs of uncertain etiology, post US guided liver biopsy (crm)     FINAL MICROSCOPIC DIAGNOSIS:  A. LIVER, NEEDLE CORE BIOPSY: Steatosis without fibrosis. See comment.  COMMENT:  The lobular hepatic architecture is preserved. The lobules reveal macrovesicular steatosis accounting for approximately 40% of overall parenchymal volume. Ballooned hepatocytes, distinct Mallory hyaline inclusions or acidophil bodies are not identified. There is no significant lobular inflammation. The portal tracts do not show evidence of significant portal inflammation, interlobular bile duct injury, bile ductular reaction, ductopenia or granulomas.  Trichrome stain does not show fibrosis. Iron is negative for iron deposit.  PAS/D stain is negative for diagnostic cytoplasmic globules.  Reticulin highlights intact reticulin framework.  Overall, the histologic findings demonstrate moderate steatosis. As you know, steatosis is a pattern of injury that may be seen in association with a variety of conditions, including alcohol, metabolic syndrome, obesity, medication/drug-induced injury, Wilson's disease, among others. Correlation with clinical information is recommended.   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.  She plans to work on her lifestyle at that time she has a daughter who is a self-professed health not and is going to help.  She does not eat well lots of fast food and she drinks a soda a day.  Also sweet tea.  She is prediabetic with a hemoglobin A1c of 6.2% last year.  1 alcoholic beverage a week.  She has had poor sleep for years.  Lots of job stress.  Amy Mcintyre GI summary of 11/03/2022 Elevated transaminases previously attributed to fatty liver by labs and imaging    - hepatic steatosis on ultrasound 12/13/18 with ALT 29    - 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    - testing for viral hepatitis and autoimmune hepatitis negative    - Fibrotest: fibrosis stage of 1, necroinflammatory grade  of A0-A1    - NASH FibroSURE 09/22/21: showed S3 (severe steatosis), F0 (no fibrosis), N2- NASH    - provided reassurance that recent CT scan findings suggest prior known results    - consider repeat staging in 12 months Abnormal imaging of the liver    - CT abd/pelvis with contrast 04/18/2019: 2.7 cm focal lesion was identified in segment 8       -  left parametrial vein  which may be seen with pelvic venous congestion syndrome.      - MRI  04/24/2019:  liver focal nodular hyperplasia. A bone island was also seen.    - MRI with Eovist 08/07/2019: stable 1.6 x 2.2 cm enhancing lesion c/w hepatic adenoma    - CT with and without contrast 01/08/22: stable findings Suspected SIBO despite prior negative testing Diarrhea- IBS    - Chronic diarrhea with intermittent bloating     - initially thought to be related to hypothyroidism     - testing for celiac negative, giardia    - normal fecal calprotectin    - negative colon and duodenal biopsies    - not improved by probiotics    - breath test negative for SIBO by hydrogen or methane    - allergy testing 2020: cow's milk, string beans Bleeding internal hemorrhoids     - band ligation x 3 with Dr. Lavon Paganini    - continue to maximize medical therapy Pelvic Floor dysfunction with decreased sphincter tone    - Completed pelvic floor PT with clinical improvement Reflux (biopsy proven) on EGD 10/19/18    - resumed PPI when she developed dysphonia Tubular adenoma 2019    - No adenomas on colonoscopy 2023    - surveillance colonoscopy recommended 2028 given the family history Family history of colon polyps (father in his 16s)      Increasing liver enzymes may be due to NASH as well as possible DILI related to her supplements. Amy Mcintyre has also been associated with elevated aminotransferases. The elevations that occur with Amy Mcintyre therapy are usually self-limited and often do not require dose modification or discontinuation. No instances of acute liver failure, chronic hepatitis or vanishing bile duct syndrome have been attributed to Amy Mcintyre.  MRI recommended to follow-up on her known adenoma given the recent escalation in liver enzymes.   11/14/2022 MRI  IMPRESSION: 1. Stable appearance of enhancing lesion within segment 7 of the liver. Imaging findings are compatible with a benign abnormality, favor small  adenoma. 2. Severe hepatic steatosis. It sounds like her diarrhea will improve when she retires (hopefully) next year. No additional evaluation or treatment discussed today.      Allergies  Allergen Reactions   Ambien [Zolpidem Tartrate] Other (See Comments)    Sleep walking   Erythromycin Swelling    Top lip swelled up.   Penicillins Swelling    Childhood reaction   Current Meds  Medication Sig   atorvastatin (LIPITOR) 40 MG tablet TAKE 1 TABLET BY MOUTH AT BEDTIME   b complex vitamins capsule Take 1 capsule by mouth daily.   baclofen (LIORESAL) 20 MG tablet TAKE 1 TABLET BY MOUTH ONCE DAILY AS NEEDED FOR MUSCLE SPASM   cholecalciferol (VITAMIN D3) 25 MCG (1000 UNIT) tablet Take 1,000 Units by mouth daily.   clonazePAM (KLONOPIN) 1 MG tablet TAKE 1/2 TO 1 (ONE-HALF TO ONE) TABLET BY MOUTH THREE TIMES DAILY AS NEEDED FOR PANIC ATTACK OR INSOMNIA   gabapentin (NEURONTIN) 300 MG capsule TAKE 1 CAPSULE BY  MOUTH THREE TIMES DAILY   levothyroxine (SYNTHROID) 75 MCG tablet Take 1 tablet by mouth once daily   pantoprazole (PROTONIX) 40 MG tablet Take 1 tablet (40 mg total) by mouth daily. Follow-up appt due in Feb must see Vickie for more refills   sertraline (ZOLOFT) 100 MG tablet Take 2 tablets (200 mg total) by mouth daily.   Past Medical History:  Diagnosis Date   Allergy    Anxiety    Cataracts, bilateral    Colitis    Depression    Eosinophilic esophagitis    Fatty liver    Gastritis    GERD (gastroesophageal reflux disease)    Hyperlipidemia    Pre-diabetes    Sleep apnea    Thyroid disease    TMJ (dislocation of temporomandibular joint)    Past Surgical History:  Procedure Laterality Date   BREAST ENHANCEMENT SURGERY     CARPAL TUNNEL RELEASE Right 07/12/2022   Procedure: RIGHT CARPAL TUNNEL RELEASE;  Surgeon: Marlyne Beards, MD;  Location: [facility] OR;  Service: Orthopedics;  Laterality: Right;   CESAREAN SECTION     NASAL SINUS SURGERY     tummy tuck     WRIST  SURGERY Left    Social History   Social History Narrative   Not on file   family history includes Alcohol abuse in her paternal grandfather; Anxiety disorder in her father and mother; Arthritis in her maternal grandmother and mother; Asthma in her brother; COPD in her brother; Colon polyps in her father; Depression in her brother, father, maternal grandfather, mother, and paternal grandmother; Drug abuse in her brother; Heart disease in her father and paternal grandfather; High Cholesterol in her father and mother; High blood pressure in her father; OCD in her mother; Panic disorder in her mother.   Review of Systems As per HPI  Physical Exam BP 124/70   Pulse 91   Ht 4\' 11"  (1.499 m)   Wt 154 lb 12.8 oz (70.2 kg)   LMP 02/04/2019   SpO2 96%   BMI 31.27 kg/m

## 2023-04-12 NOTE — Patient Instructions (Addendum)
Good to meet you today.  Please read the handouts about fatty liver and eating changes.  Goal # 1 is to eliminate sodas and sweet tea and avoid sugary beverages. I recommend avoiding sugar-free also.  Work on stress and sleep also when you retire.  I do not think you need to see a gastrointestinal specialist for a year and if your labs normalize with lifestyle changes, maybe not unless primary care wants you too.  I noticed that your hemoglobin A1C was 6.2 % last year so you are pre-diabetic - these lifestyle changes will help that also. It's all tied together.  Dr. Lynnae January (fatty liver handout) has a good You Tube channel also.   I appreciate the opportunity to care for you. Iva Boop, MD, [facility]

## 2023-05-24 ENCOUNTER — Telehealth: Payer: Self-pay | Admitting: Psychiatry

## 2023-05-24 ENCOUNTER — Other Ambulatory Visit: Payer: Self-pay | Admitting: Family Medicine

## 2023-05-24 DIAGNOSIS — M26609 Unspecified temporomandibular joint disorder, unspecified side: Secondary | ICD-10-CM

## 2023-05-24 DIAGNOSIS — F5101 Primary insomnia: Secondary | ICD-10-CM

## 2023-05-24 DIAGNOSIS — F411 Generalized anxiety disorder: Secondary | ICD-10-CM

## 2023-05-24 NOTE — Telephone Encounter (Signed)
Address: [address], [place], [place] [address]

## 2023-05-24 NOTE — Telephone Encounter (Signed)
Pharmacy called at 2:41p.  Pt is requesting refill of Clonazepam 1mg  to Apothe[facility]ary at [facility].  Next appt 7/9

## 2023-05-24 NOTE — Telephone Encounter (Signed)
Prescription Request  05/24/2023  LOV: 02/25/2023  What is the name of the medication or equipment? gabapentin (NEURONTIN) 300 MG capsule   Have you contacted your pharmacy to request a refill? Yes   Which pharmacy would you like this sent to?  [facility] at [facility] in [place], [place] Phone:[phone] Fax:[phone]   Patient notified that their request is being sent to the clinical staff for review and that they should receive a response within 2 business days.   Please advise at Mobile [phone] (mobile)

## 2023-05-24 NOTE — Telephone Encounter (Signed)
Last fill: 03/09/23, 90 capsule 1 refill LOV: 02/25/23

## 2023-05-25 ENCOUNTER — Telehealth: Payer: Self-pay | Admitting: Family Medicine

## 2023-05-25 DIAGNOSIS — M26609 Unspecified temporomandibular joint disorder, unspecified side: Secondary | ICD-10-CM

## 2023-05-25 MED ORDER — CLONAZEPAM 1 MG PO TABS: ORAL_TABLET | ORAL | 1 refills | Status: DC

## 2023-05-25 NOTE — Telephone Encounter (Signed)
LM for pt

## 2023-05-25 NOTE — Telephone Encounter (Signed)
Prescription Request  05/25/2023  LOV: 02/25/2023  What is the name of the medication or equipment? Gabapentin 300 mg.  Have you contacted your pharmacy to request a refill? Yes   Which pharmacy would you like this sent to?   [facility], [place] [address] [place] [address] Phone: [phone] Fax: [phone]    Patient notified that their request is being sent to the clinical staff for review and that they should receive a response within 2 business days.   Please advise at Mobile [phone] (mobile)

## 2023-05-25 NOTE — Telephone Encounter (Signed)
Vickie: "Please see if she has moved. Ok to give her a 90 day refill if so and then she will need to establish with a new PCP in the area. "  LM for pt to call our office and let us know

## 2023-05-25 NOTE — Telephone Encounter (Signed)
Please let her know script was sent

## 2023-05-26 NOTE — Telephone Encounter (Signed)
**  PLEASE SEE BELOW MESSAGES**  LM for pt to let us know if she has moved

## 2023-05-26 NOTE — Telephone Encounter (Signed)
Prescription Request  05/26/2023  LOV: 02/25/2023  What is the name of the medication or equipment?  baclofen (LIORESAL) 20 MG tablet   Have you contacted your pharmacy to request a refill? Yes   Which pharmacy would you like this sent to?  [facility] at [facility], [place] - [address] [name] [address] Phone: [phone] Fax: [phone]    Patient notified that their request is being sent to the clinical staff for review and that they should receive a response within 2 business days.   Please advise at Mobile [phone] (mobile)

## 2023-05-27 ENCOUNTER — Other Ambulatory Visit: Payer: Self-pay | Admitting: Family Medicine

## 2023-05-27 DIAGNOSIS — E785 Hyperlipidemia, unspecified: Secondary | ICD-10-CM

## 2023-05-27 MED ORDER — GABAPENTIN 300 MG PO CAPS
300.0000 mg | ORAL_CAPSULE | Freq: Three times a day (TID) | ORAL | 0 refills | Status: DC
Start: 2023-05-27 — End: 2023-08-22

## 2023-06-14 ENCOUNTER — Telehealth (INDEPENDENT_AMBULATORY_CARE_PROVIDER_SITE_OTHER): Payer: PRIVATE HEALTH INSURANCE | Admitting: Psychiatry

## 2023-06-14 ENCOUNTER — Encounter: Payer: Self-pay | Admitting: Psychiatry

## 2023-06-14 DIAGNOSIS — F411 Generalized anxiety disorder: Secondary | ICD-10-CM | POA: Diagnosis not present

## 2023-06-14 DIAGNOSIS — F331 Major depressive disorder, recurrent, moderate: Secondary | ICD-10-CM

## 2023-06-14 DIAGNOSIS — F5101 Primary insomnia: Secondary | ICD-10-CM

## 2023-06-14 MED ORDER — SERTRALINE HCL 100 MG PO TABS
200.0000 mg | ORAL_TABLET | Freq: Every day | ORAL | 1 refills | Status: DC
Start: 2023-06-14 — End: 2023-08-31

## 2023-06-14 MED ORDER — CLONAZEPAM 1 MG PO TABS
ORAL_TABLET | ORAL | 2 refills | Status: DC
Start: 2023-07-20 — End: 2023-09-29

## 2023-06-14 NOTE — Progress Notes (Signed)
Amy Mcintyre [id number] August 11, 1966 57 y.o.  Virtual Visit via Video Note  I connected with pt @ on 06/14/23 at 11:00 AM EDT by a video enabled telemedicine application and verified that I am speaking with the correct person using two identifiers.   I discussed the limitations of evaluation and management by telemedicine and the availability of in person appointments. The patient expressed understanding and agreed to proceed.  I discussed the assessment and treatment plan with the patient. The patient was provided an opportunity to ask questions and all were answered. The patient agreed with the plan and demonstrated an understanding of the instructions.   The patient was advised to call back or seek an in-person evaluation if the symptoms worsen or if the condition fails to improve as anticipated.  I provided 37 minutes of non-face-to-face time during this encounter.  The patient was located at home.  The provider was located at [facility].   Corie Chiquito, PMHNP   Subjective:   Patient ID:  Amy Mcintyre is a 57 y.o. (DOB [birth year]) female.  Chief Complaint:  Chief Complaint  Patient presents with   Anxiety   Depression    HPI Amy Mcintyre presents for follow-up of anxiety, depression, and insomnia.   She reports that her last day of work was 05/06/23 and moved to [place] on 05/07/23. She reports that her brother had a severe MVA and believed to have a seizure or stroke while driving. Brother was in ICU and a medically induced coma. Brother is out of the hospital now and in rehab. "That was hard and very scary to deal with." Brother has been "pretty depressed" and is concerned about how he will function and return to work. She is going to visit her mom and brother next week.   She reports that she is adjusting to less structure in her day with retiring. "Now I feel like none of it mattered" in terms of working hard and working long hours. "I didn't know it would make me  feel so unimportant... I feel like I made the wrong choice by putting work first" over self-care and friendships. "I feel like I have lost my identity." She reports that she is not interested in some of the available activities near her. She reports that she has been intentional about taking care of personal hygiene in the morning and getting dressed daily. She reports that her appetite has been decreased. She reports that she was having difficulty with sleeping and sleep has since improved.   She reports increased anxiety and depression since retirement. She reports, "I do think it is getting a little bit better" and she is no longer crying every day and crying episodes are shorter in duration. She reports, "I'm frozen with social anxiety." She reports occasionally feeling "isolated." She reports that she is worried about her finances after seeing a large portion of her retirement check going towards COBRA coverage premiums. "I know intellectually I have nothing to be depressed or anxious about." She reports having panic attacks. She reports that she will start to panic whenever she has difficulty with technology and occasionally in public. She reports adequate concentration and has been able to read without difficulty. Denies SI.   She reports that she had a retirement party at work and "it was overwhelming" with the amount of cards she has received. She reports that she is "a little hurt" that coworkers have not maintained contact.   She reports that she and Tammy Sours have not lived  together full-time since 2009. She has met some of her neighbors. She reports that a former classmate lives in the same area and they recently got together.   She has been reading some. She and Tammy Sours ride bikes and go [employer] periodically.   She has not scheduled appointment with therapist recently. She questions benefits of therapy.   Klonopin last filled 05/25/23.   Past Psychiatric Medication  Trials: Paxil Prozac Sertraline Nortriptyline Wellbutrin- not helpful for depression.  Doxepin BuSpar Abilify-increased heart rate Rexulti-Helpful for mood and anxiety Seroquel-adverse effects Gabapentin- takes for jaw pain Lunesta-not effective Ambien- parasomnias Trazodone-ineffective Belsomra-worsening insomnia Klonopin  Review of Systems:  Review of Systems  Gastrointestinal:        Occ nausea with anxiety  Musculoskeletal:  Negative for gait problem.  Psychiatric/Behavioral:         Please refer to HPI    Medications: I have reviewed the patient's current medications.  Current Outpatient Medications  Medication Sig Dispense Refill   b complex vitamins capsule Take 1 capsule by mouth daily.     baclofen (LIORESAL) 20 MG tablet TAKE 1 TABLET BY MOUTH ONCE DAILY AS NEEDED FOR MUSCLE SPASM 30 tablet 0   cholecalciferol (VITAMIN D3) 25 MCG (1000 UNIT) tablet Take 1,000 Units by mouth daily.     gabapentin (NEURONTIN) 300 MG capsule Take 1 capsule (300 mg total) by mouth 3 (three) times daily. 280 capsule 0   levothyroxine (SYNTHROID) 75 MCG tablet Take 1 tablet by mouth once daily 90 tablet 1   pantoprazole (PROTONIX) 40 MG tablet TAKE 1 TABLET BY MOUTH DAILY 90 tablet 1   atorvastatin (LIPITOR) 40 MG tablet TAKE 1 TABLET BY MOUTH AT BEDTIME (Patient not taking: Reported on 06/14/2023) 90 tablet 0   [START ON 07/20/2023] clonazePAM (KLONOPIN) 1 MG tablet TAKE 1/2 TO 1 (ONE-HALF TO ONE) TABLET BY MOUTH THREE TIMES DAILY AS NEEDED FOR PANIC ATTACK OR INSOMNIA 90 tablet 2   sertraline (ZOLOFT) 100 MG tablet Take 2 tablets (200 mg total) by mouth daily. 180 tablet 1   No current facility-administered medications for this visit.    Medication Side Effects: None  Allergies:  Allergies  Allergen Reactions   Ambien [Zolpidem Tartrate] Other (See Comments)    Sleep walking   Erythromycin Swelling    Top lip swelled up.   Penicillins Swelling    Childhood reaction    Past  Medical History:  Diagnosis Date   Allergy    Anxiety    Cataracts, bilateral    Colitis    Depression    Eosinophilic esophagitis    Fatty liver    Gastritis    GERD (gastroesophageal reflux disease)    Hyperlipidemia    Pre-diabetes    Sleep apnea    Thyroid disease    TMJ (dislocation of temporomandibular joint)     Family History  Problem Relation Age of Onset   Anxiety disorder Mother    Panic disorder Mother    OCD Mother    Depression Mother    Arthritis Mother    High Cholesterol Mother    Anxiety disorder Father    Depression Father    Colon polyps Father    Heart disease Father    High Cholesterol Father    High blood pressure Father    Drug abuse Brother    Asthma Brother    COPD Brother    Depression Brother    Arthritis Maternal Grandmother    Depression Maternal Grandfather  Depression Paternal Grandmother    Alcohol abuse Paternal Grandfather    Heart disease Paternal Grandfather    Colon cancer Neg Hx    Esophageal cancer Neg Hx    Pancreatic cancer Neg Hx    Stomach cancer Neg Hx    Liver disease Neg Hx    Rectal cancer Neg Hx     Social History   Socioeconomic History   Marital status: Single    Spouse name: Not on file   Number of children: 1   Years of education: Not on file   Highest education level: Not on file  Occupational History   Occupation: MANAGER  Tobacco Use   Smoking status: Never    Passive exposure: Never   Smokeless tobacco: Never  Vaping Use   Vaping Use: Never used  Substance and Sexual Activity   Alcohol use: Yes    Comment: socially   Drug use: Never   Sexual activity: Not Currently  Other Topics Concern   Not on file  Social History Narrative   Not on file   Social Determinants of Health   Financial Resource Strain: Not on file  Food Insecurity: Not on file  Transportation Needs: Not on file  Physical Activity: Not on file  Stress: Not on file  Social Connections: Not on file  Intimate Partner  Violence: Not on file    Past Medical History, Surgical history, Social history, and Family history were reviewed and updated as appropriate.   Please see review of systems for further details on the patient's review from today.   Objective:   Physical Exam:  LMP 02/04/2019   Physical Exam Neurological:     Mental Status: She is alert and oriented to person, place, and time.     Cranial Nerves: No dysarthria.  Psychiatric:        Attention and Perception: Attention and perception normal.        Mood and Affect: Mood is anxious and depressed. Affect is tearful.        Speech: Speech normal.        Behavior: Behavior is cooperative.        Thought Content: Thought content normal. Thought content is not paranoid or delusional. Thought content does not include homicidal or suicidal ideation. Thought content does not include homicidal or suicidal plan.        Cognition and Memory: Cognition and memory normal.        Judgment: Judgment normal.     Comments: Insight intact     Lab Review:     Component Value Date/Time   NA 137 03/17/2023 1152   K 4.2 03/17/2023 1152   CL 103 03/17/2023 1152   CO2 26 03/17/2023 1152   GLUCOSE 105 (H) 03/17/2023 1152   BUN 12 03/17/2023 1152   CREATININE 0.77 03/17/2023 1152   CALCIUM 9.3 03/17/2023 1152   PROT 7.1 03/17/2023 1152   PROT 7.1 10/21/2022 0914   ALBUMIN 4.1 03/17/2023 1152   ALBUMIN 4.9 10/21/2022 0914   AST 62 (H) 03/17/2023 1152   ALT 77 (H) 03/17/2023 1152   ALT 39 (H) 04/18/2019 0821   ALKPHOS 78 03/17/2023 1152   BILITOT 0.6 03/17/2023 1152   BILITOT 0.2 10/21/2022 0914   GFRNONAA >60 03/17/2023 1152       Component Value Date/Time   WBC 4.0 03/17/2023 1152   RBC 3.99 03/17/2023 1152   HGB 12.2 03/17/2023 1152   HCT 37.4 03/17/2023 1152   PLT 180 03/17/2023  1152   MCV 93.7 03/17/2023 1152   MCH 30.6 03/17/2023 1152   MCHC 32.6 03/17/2023 1152   RDW 12.9 03/17/2023 1152   LYMPHSABS 1.4 03/17/2023 1152   MONOABS  0.3 03/17/2023 1152   EOSABS 0.1 03/17/2023 1152   BASOSABS 0.0 03/17/2023 1152    No results found for: "POCLITH", "LITHIUM"   No results found for: "PHENYTOIN", "PHENOBARB", "VALPROATE", "CBMZ"   .res Assessment: Plan:   I spent 37 minutes dedicated to the care of this patient on the date of this  encounter to include pre-visit review of records, face-to-face time with the patient discussing treatment options for depression and anxiety, ordering of medication, and post visit documentation. Pt reports, "I really don't want to go back on a different medication." Declines adjunctive treatment for depression at this time. Encouraged pt to consider resuming therapy to process multiple recent transitions and stressors. Pt reports that she would like to continue medications without changes at this time and that she prefers to focus on adjusting to recent changes.  Will continue Sertraline 200 mg daily for anxiety and depression.  Continue Klonopin 1 mg 1/2-1 tab po TID prn anxiety.  Pt to follow-up in 2-3 months or sooner if clinically indicated.  Patient advised to contact office with any questions, adverse effects, or acute worsening in signs and symptoms.   Jene was seen today for anxiety and depression.  Diagnoses and all orders for this visit:  Moderate episode of recurrent major depressive disorder (HCC) -     sertraline (ZOLOFT) 100 MG tablet; Take 2 tablets (200 mg total) by mouth daily.  Generalized anxiety disorder -     sertraline (ZOLOFT) 100 MG tablet; Take 2 tablets (200 mg total) by mouth daily. -     clonazePAM (KLONOPIN) 1 MG tablet; TAKE 1/2 TO 1 (ONE-HALF TO ONE) TABLET BY MOUTH THREE TIMES DAILY AS NEEDED FOR PANIC ATTACK OR INSOMNIA  Primary insomnia -     clonazePAM (KLONOPIN) 1 MG tablet; TAKE 1/2 TO 1 (ONE-HALF TO ONE) TABLET BY MOUTH THREE TIMES DAILY AS NEEDED FOR PANIC ATTACK OR INSOMNIA     Please see After Visit Summary for patient specific  instructions.  Future Appointments  Date Time Provider Department Center  08/31/2023  1:20 PM Henson, Vickie L, NP-C [facility] None    No orders of the defined types were placed in this encounter.     -------------------------------

## 2023-07-21 ENCOUNTER — Encounter (INDEPENDENT_AMBULATORY_CARE_PROVIDER_SITE_OTHER): Payer: Self-pay

## 2023-08-22 ENCOUNTER — Telehealth: Payer: Self-pay | Admitting: Family Medicine

## 2023-08-22 ENCOUNTER — Other Ambulatory Visit: Payer: Self-pay | Admitting: Family Medicine

## 2023-08-22 DIAGNOSIS — E039 Hypothyroidism, unspecified: Secondary | ICD-10-CM

## 2023-08-22 DIAGNOSIS — M26609 Unspecified temporomandibular joint disorder, unspecified side: Secondary | ICD-10-CM

## 2023-08-22 MED ORDER — GABAPENTIN 300 MG PO CAPS
300.0000 mg | ORAL_CAPSULE | Freq: Three times a day (TID) | ORAL | 0 refills | Status: DC
Start: 2023-08-22 — End: 2023-08-31

## 2023-08-22 NOTE — Telephone Encounter (Signed)
Patient is changing pharmacies and would like it sent to the pharmacy below. She has an OV scheduled for 9/25/204.   Prescription Request  08/22/2023  LOV: 02/25/2023  What is the name of the medication or equipment? gabapentin (NEURONTIN) 300 MG capsule   Have you contacted your pharmacy to request a refill? Yes   Which pharmacy would you like this sent to?  [id number] - [place], [place] - [address] [place] [address] Phone: [phone] Fax: [phone]    Patient notified that their request is being sent to the clinical staff for review and that they should receive a response within 2 business days.   Please advise at Mobile [phone] (mobile)

## 2023-08-22 NOTE — Telephone Encounter (Signed)
Rx sent 

## 2023-08-31 ENCOUNTER — Encounter: Payer: Self-pay | Admitting: Psychiatry

## 2023-08-31 ENCOUNTER — Ambulatory Visit (INDEPENDENT_AMBULATORY_CARE_PROVIDER_SITE_OTHER): Payer: PRIVATE HEALTH INSURANCE | Admitting: Family Medicine

## 2023-08-31 ENCOUNTER — Encounter: Payer: Self-pay | Admitting: Family Medicine

## 2023-08-31 ENCOUNTER — Ambulatory Visit (INDEPENDENT_AMBULATORY_CARE_PROVIDER_SITE_OTHER): Payer: PRIVATE HEALTH INSURANCE | Admitting: Psychiatry

## 2023-08-31 ENCOUNTER — Ambulatory Visit: Payer: PRIVATE HEALTH INSURANCE | Admitting: Psychiatry

## 2023-08-31 VITALS — BP 130/86 | HR 102 | Temp 97.8°F | Ht 59.0 in | Wt 152.0 lb

## 2023-08-31 VITALS — Wt 149.0 lb

## 2023-08-31 DIAGNOSIS — R748 Abnormal levels of other serum enzymes: Secondary | ICD-10-CM

## 2023-08-31 DIAGNOSIS — R6889 Other general symptoms and signs: Secondary | ICD-10-CM | POA: Insufficient documentation

## 2023-08-31 DIAGNOSIS — F331 Major depressive disorder, recurrent, moderate: Secondary | ICD-10-CM

## 2023-08-31 DIAGNOSIS — E785 Hyperlipidemia, unspecified: Secondary | ICD-10-CM

## 2023-08-31 DIAGNOSIS — F411 Generalized anxiety disorder: Secondary | ICD-10-CM | POA: Diagnosis not present

## 2023-08-31 DIAGNOSIS — M26609 Unspecified temporomandibular joint disorder, unspecified side: Secondary | ICD-10-CM

## 2023-08-31 DIAGNOSIS — F332 Major depressive disorder, recurrent severe without psychotic features: Secondary | ICD-10-CM

## 2023-08-31 DIAGNOSIS — F439 Reaction to severe stress, unspecified: Secondary | ICD-10-CM

## 2023-08-31 DIAGNOSIS — F330 Major depressive disorder, recurrent, mild: Secondary | ICD-10-CM

## 2023-08-31 DIAGNOSIS — R7303 Prediabetes: Secondary | ICD-10-CM | POA: Diagnosis not present

## 2023-08-31 DIAGNOSIS — F419 Anxiety disorder, unspecified: Secondary | ICD-10-CM | POA: Diagnosis not present

## 2023-08-31 DIAGNOSIS — K760 Fatty (change of) liver, not elsewhere classified: Secondary | ICD-10-CM

## 2023-08-31 DIAGNOSIS — K219 Gastro-esophageal reflux disease without esophagitis: Secondary | ICD-10-CM | POA: Diagnosis not present

## 2023-08-31 DIAGNOSIS — F5101 Primary insomnia: Secondary | ICD-10-CM

## 2023-08-31 DIAGNOSIS — E039 Hypothyroidism, unspecified: Secondary | ICD-10-CM | POA: Diagnosis not present

## 2023-08-31 DIAGNOSIS — F32A Depression, unspecified: Secondary | ICD-10-CM

## 2023-08-31 LAB — TSH: TSH: 2.63 u[IU]/mL (ref 0.35–5.50)

## 2023-08-31 LAB — HEMOGLOBIN A1C: Hgb A1c MFr Bld: 6.1 % (ref 4.6–6.5)

## 2023-08-31 LAB — COMPREHENSIVE METABOLIC PANEL
ALT: 242 U/L — ABNORMAL HIGH (ref 0–35)
AST: 185 U/L — ABNORMAL HIGH (ref 0–37)
Albumin: 4.6 g/dL (ref 3.5–5.2)
Alkaline Phosphatase: 104 U/L (ref 39–117)
BUN: 8 mg/dL (ref 6–23)
CO2: 30 mEq/L (ref 19–32)
Calcium: 9.9 mg/dL (ref 8.4–10.5)
Chloride: 97 mEq/L (ref 96–112)
Creatinine, Ser: 0.75 mg/dL (ref 0.40–1.20)
GFR: 88.15 mL/min (ref >60.00)
Glucose, Bld: 104 mg/dL — ABNORMAL HIGH (ref 70–99)
Potassium: 4.1 mEq/L (ref 3.5–5.1)
Sodium: 138 mEq/L (ref 135–145)
Total Bilirubin: 0.4 mg/dL (ref 0.2–1.2)
Total Protein: 7.3 g/dL (ref 6.0–8.3)

## 2023-08-31 LAB — LIPID PANEL
Cholesterol: 297 mg/dL — ABNORMAL HIGH (ref 0–200)
HDL: 61.9 mg/dL (ref >39.00)
LDL Cholesterol: 157 mg/dL — ABNORMAL HIGH (ref 0–99)
NonHDL: 234.72
Total CHOL/HDL Ratio: 5
Triglycerides: 388 mg/dL — ABNORMAL HIGH (ref 0.0–149.0)
VLDL: 77.6 mg/dL — ABNORMAL HIGH (ref 0.0–40.0)

## 2023-08-31 LAB — CBC WITH DIFFERENTIAL/PLATELET
Basophils Absolute: 0 10*3/uL (ref 0.0–0.1)
Basophils Relative: 0.7 % (ref 0.0–3.0)
Eosinophils Absolute: 0 10*3/uL (ref 0.0–0.7)
Eosinophils Relative: 0.3 % (ref 0.0–5.0)
HCT: 40.8 % (ref 36.0–46.0)
Hemoglobin: 13.3 g/dL (ref 12.0–15.0)
Lymphocytes Relative: 24.7 % (ref 12.0–46.0)
Lymphs Abs: 1.5 10*3/uL (ref 0.7–4.0)
MCHC: 32.7 g/dL (ref 30.0–36.0)
MCV: 97 fl (ref 78.0–100.0)
Monocytes Absolute: 0.4 10*3/uL (ref 0.1–1.0)
Monocytes Relative: 7 % (ref 3.0–12.0)
Neutro Abs: 4.1 10*3/uL (ref 1.4–7.7)
Neutrophils Relative %: 67.3 % (ref 43.0–77.0)
Platelets: 200 10*3/uL (ref 150.0–400.0)
RBC: 4.21 Mil/uL (ref 3.87–5.11)
RDW: 15.5 % (ref 11.5–15.5)
WBC: 6 10*3/uL (ref 4.0–10.5)

## 2023-08-31 MED ORDER — PANTOPRAZOLE SODIUM 40 MG PO TBEC
DELAYED_RELEASE_TABLET | ORAL | 1 refills | Status: DC
Start: 2023-08-31 — End: 2023-12-05

## 2023-08-31 MED ORDER — SERTRALINE HCL 100 MG PO TABS
200.0000 mg | ORAL_TABLET | Freq: Every day | ORAL | 1 refills | Status: DC
Start: 2023-08-31 — End: 2023-10-27

## 2023-08-31 MED ORDER — LEVOTHYROXINE SODIUM 75 MCG PO TABS
75.0000 ug | ORAL_TABLET | Freq: Every day | ORAL | 1 refills | Status: DC
Start: 2023-08-31 — End: 2023-12-05

## 2023-08-31 MED ORDER — BREXPIPRAZOLE 1 MG PO TABS
1.0000 mg | ORAL_TABLET | Freq: Every day | ORAL | Status: DC
Start: 2023-08-31 — End: 2023-09-29

## 2023-08-31 MED ORDER — GABAPENTIN 300 MG PO CAPS
300.0000 mg | ORAL_CAPSULE | Freq: Three times a day (TID) | ORAL | 0 refills | Status: DC
Start: 2023-08-31 — End: 2023-12-05

## 2023-08-31 MED ORDER — REXULTI 0.5 MG PO TABS
0.5000 mg | ORAL_TABLET | Freq: Every day | ORAL | Status: DC
Start: 2023-08-31 — End: 2023-09-30

## 2023-08-31 NOTE — Progress Notes (Signed)
Please reach out to see if she saw my recommendations. Her liver enzymes are significantly elevated and I would like for her to reach out to her GI provider.

## 2023-08-31 NOTE — Assessment & Plan Note (Signed)
Liver biopsy in April. Recheck LFTs and f/u

## 2023-08-31 NOTE — Assessment & Plan Note (Signed)
Continue gabapentin.   She still has some breakthrough pain at times.  Wearing a mouthguard during the day.  Increased stress contributing to clenching

## 2023-08-31 NOTE — Assessment & Plan Note (Signed)
Continue medication at current dose. Check TSH and follow up

## 2023-08-31 NOTE — Assessment & Plan Note (Signed)
Followed by mental health provider

## 2023-08-31 NOTE — Progress Notes (Signed)
Subjective:     Patient ID: Amy Mcintyre, female    DOB: 28-Mar-1966, 57 y.o.   MRN: [id number]  Chief Complaint  Patient presents with   Medical Management of Chronic Issues    6 month f/u   2 of toes on left foot feel like they are trying to overlap.  Hot all the time, and can't seem to lose any weight. Cried every day since June 1st, retired. Not sure of upcoming living situation if partner she lives w will kick her out    HPI  Discussed the use of AI scribe software for clinical note transcription with the patient, who gave verbal consent to proceed.  History of Present Illness         Other providers:  Behavioral Health- Corie Chiquito, [facility] for GAD, depression and insomnia.  GI- Dr. Orvan Falconer for chronic diarrhea and fatty liver  Eye doctor Dentist  Dermatologist - Dr. Roxan Hockey in [place].  OB/GYN in [facility] in [place] (states she faxed records to me in May)  She saw her gynecologist this morning and will start on HRT.   She saw Corie Chiquito at [facility] this morning. She started back on Rexulti  She is going to her counselor Bethann Berkshire after this visit.   Seeing her eye doctor and dentist tomorrow.  Glaucoma is new.   Moved to [place] in June. She may move back to [place], [place].  Reports being in an unhealthy relationship   2 toes on left foot are trying to cross over.   Feels hot all the time for the past 6 months. No flashes.   Hx of elevated LFTs. She had a liver biopsy 5 months ago.    Health Maintenance Due  Topic Date Due   HIV Screening  Never done   Cervical Cancer Screening (HPV/Pap Cotest)  08/07/2023    Past Medical History:  Diagnosis Date   Allergy    Anxiety    Cataracts, bilateral    Colitis    Depression    Eosinophilic esophagitis    Fatty liver    Gastritis    GERD (gastroesophageal reflux disease)    Hyperlipidemia    Pre-diabetes    Sleep apnea    Thyroid disease    TMJ (dislocation of  temporomandibular joint)     Past Surgical History:  Procedure Laterality Date   BREAST ENHANCEMENT SURGERY     CARPAL TUNNEL RELEASE Right 07/12/2022   Procedure: RIGHT CARPAL TUNNEL RELEASE;  Surgeon: Marlyne Beards, MD;  Location: [facility] OR;  Service: Orthopedics;  Laterality: Right;   CESAREAN SECTION     NASAL SINUS SURGERY     tummy tuck     WRIST SURGERY Left     Family History  Problem Relation Age of Onset   Anxiety disorder Mother    Panic disorder Mother    OCD Mother    Depression Mother    Arthritis Mother    High Cholesterol Mother    Anxiety disorder Father    Depression Father    Colon polyps Father    Heart disease Father    High Cholesterol Father    High blood pressure Father    Drug abuse Brother    Asthma Brother    COPD Brother    Depression Brother    Arthritis Maternal Grandmother    Depression Maternal Grandfather    Depression Paternal Grandmother    Alcohol abuse Paternal Grandfather    Heart disease Paternal Grandfather  Colon cancer Neg Hx    Esophageal cancer Neg Hx    Pancreatic cancer Neg Hx    Stomach cancer Neg Hx    Liver disease Neg Hx    Rectal cancer Neg Hx     Social History   Socioeconomic History   Marital status: Single    Spouse name: Not on file   Number of children: 1   Years of education: Not on file   Highest education level: Master's degree (e.g., MA, MS, MEng, MEd, MSW, MBA)  Occupational History   Occupation: MANAGER  Tobacco Use   Smoking status: Never    Passive exposure: Never   Smokeless tobacco: Never  Vaping Use   Vaping status: Never Used  Substance and Sexual Activity   Alcohol use: Yes    Comment: socially   Drug use: Never   Sexual activity: Not Currently  Other Topics Concern   Not on file  Social History Narrative   Not on file   Social Determinants of Health   Financial Resource Strain: Low Risk  (08/27/2023)   Overall Financial Resource Strain (CARDIA)    Difficulty of Paying  Living Expenses: Not hard at all  Food Insecurity: No Food Insecurity (08/27/2023)   Hunger Vital Sign    Worried About Running Out of Food in the Last Year: Never true    Ran Out of Food in the Last Year: Never true  Transportation Needs: No Transportation Needs (08/27/2023)   PRAPARE - Administrator, Civil Service (Medical): No    Lack of Transportation (Non-Medical): No  Physical Activity: Insufficiently Active (08/27/2023)   Exercise Vital Sign    Days of Exercise per Week: 1 day    Minutes of Exercise per Session: 20 min  Stress: Stress Concern Present (08/27/2023)   [employer] of Occupational Health - Occupational Stress Questionnaire    Feeling of Stress : Very much  Social Connections: Unknown (08/27/2023)   Social Connection and Isolation Panel [NHANES]    Frequency of Communication with Friends and Family: Twice a week    Frequency of Social Gatherings with Friends and Family: Patient declined    Attends Religious Services: Never    Database administrator or Organizations: No    Attends Engineer, structural: Not on file    Marital Status: Divorced  Catering manager Violence: Not on file    Outpatient Medications Prior to Visit  Medication Sig Dispense Refill   Brexpiprazole (REXULTI) 0.5 MG TABS Take 1 tablet (0.5 mg total) by mouth daily for 7 days.     brexpiprazole (REXULTI) 1 MG TABS tablet Take 1 tablet (1 mg total) by mouth daily. At bedtime     clonazePAM (KLONOPIN) 1 MG tablet TAKE 1/2 TO 1 (ONE-HALF TO ONE) TABLET BY MOUTH THREE TIMES DAILY AS NEEDED FOR PANIC ATTACK OR INSOMNIA 90 tablet 2   sertraline (ZOLOFT) 100 MG tablet Take 2 tablets (200 mg total) by mouth daily. 180 tablet 1   gabapentin (NEURONTIN) 300 MG capsule Take 1 capsule (300 mg total) by mouth 3 (three) times daily. 280 capsule 0   levothyroxine (SYNTHROID) 75 MCG tablet Take 1 tablet by mouth once daily 90 tablet 0   pantoprazole (PROTONIX) 40 MG tablet TAKE 1 TABLET BY  MOUTH DAILY 90 tablet 1   atorvastatin (LIPITOR) 40 MG tablet TAKE 1 TABLET BY MOUTH AT BEDTIME (Patient not taking: Reported on 06/14/2023) 90 tablet 0   No facility-administered medications prior to visit.  Allergies  Allergen Reactions   Ambien [Zolpidem Tartrate] Other (See Comments)    Sleep walking   Erythromycin Swelling    Top lip swelled up.   Penicillins Swelling    Childhood reaction    Review of Systems  Constitutional:  Positive for malaise/fatigue. Negative for chills and fever.  Respiratory:  Negative for shortness of breath.   Cardiovascular:  Negative for chest pain, palpitations and leg swelling.  Gastrointestinal:  Positive for vomiting. Negative for abdominal pain, constipation, diarrhea and nausea.       Decreased appetite   Genitourinary:  Negative for dysuria, frequency and urgency.  Neurological:  Negative for dizziness and focal weakness.  Psychiatric/Behavioral:  Positive for depression. Negative for substance abuse and suicidal ideas. The patient is nervous/anxious and has insomnia.        Objective:    Physical Exam Constitutional:      General: She is not in acute distress.    Appearance: She is not ill-appearing.  Eyes:     Extraocular Movements: Extraocular movements intact.     Conjunctiva/sclera: Conjunctivae normal.  Cardiovascular:     Rate and Rhythm: Normal rate and regular rhythm.  Pulmonary:     Effort: Pulmonary effort is normal.     Breath sounds: Normal breath sounds.  Musculoskeletal:     Cervical back: Normal range of motion and neck supple.  Skin:    General: Skin is warm and dry.  Neurological:     General: No focal deficit present.     Mental Status: She is alert and oriented to person, place, and time.     Motor: No weakness.     Coordination: Coordination normal.     Gait: Gait normal.  Psychiatric:        Attention and Perception: Attention normal.        Mood and Affect: Mood normal. Affect is tearful.         Speech: Speech normal.        Behavior: Behavior normal.        Thought Content: Thought content normal.      BP 130/86 (BP Location: Left Arm, Patient Position: Sitting, Cuff Size: Large)   Pulse (!) 102   Temp 97.8 F (36.6 C) (Temporal)   Ht 4\' 11"  (1.499 m)   Wt 152 lb (68.9 kg)   LMP 02/04/2019   SpO2 97%   BMI 30.70 kg/m  Wt Readings from Last 3 Encounters:  08/31/23 152 lb (68.9 kg)  04/12/23 154 lb 12.8 oz (70.2 kg)  02/25/23 154 lb (69.9 kg)       Assessment & Plan:   Problem List Items Addressed This Visit       Digestive   Fatty liver    Advised that she is at an increased risk of developing liver cancer and cirrhosis.  Recommend avoiding all alcohol and eating a low-fat diet.      Relevant Orders   CBC with Differential/Platelet (Completed)   Comprehensive metabolic panel (Completed)     Endocrine   Hypothyroidism - Primary    Continue medication at current dose. Check TSH and follow up      Relevant Medications   levothyroxine (SYNTHROID) 75 MCG tablet   Other Relevant Orders   CBC with Differential/Platelet (Completed)   Comprehensive metabolic panel (Completed)   TSH (Completed)     Musculoskeletal and Integument   Temporomandibular joint disorder    Continue gabapentin.   She still has some breakthrough pain at times.  Wearing a mouthguard during the day.  Increased stress contributing to clenching       Relevant Medications   gabapentin (NEURONTIN) 300 MG capsule     Other   Anxiety and depression    Followed by mental health provider       Elevated liver enzymes    Liver biopsy in April. Recheck LFTs and f/u      Relevant Orders   CBC with Differential/Platelet (Completed)   Comprehensive metabolic panel (Completed)   Gastric reflux    Continue PPI. Follow up with GI prn      Relevant Medications   pantoprazole (PROTONIX) 40 MG tablet   Hyperlipidemia   Relevant Orders   Lipid panel (Completed)   Prediabetes      Recommend low sugar and carbohydrate diet and increasing physical activity as tolerated.  Did not tolerate metformin, thought diarrhea caused by medication follow up pending A1c       Relevant Orders   CBC with Differential/Platelet (Completed)   Comprehensive metabolic panel (Completed)   Hemoglobin A1c (Completed)   Sensation of feeling hot    Check labs       Relevant Orders   CBC with Differential/Platelet (Completed)   Comprehensive metabolic panel (Completed)   TSH (Completed)   Other Visit Diagnoses     Stress at home           I have changed Dois Davenport Mcclatchey's levothyroxine. I am also having her maintain her atorvastatin, clonazePAM, Rexulti, brexpiprazole, sertraline, pantoprazole, and gabapentin.  Meds ordered this encounter  Medications   pantoprazole (PROTONIX) 40 MG tablet    Sig: TAKE 1 TABLET BY MOUTH DAILY    Dispense:  90 tablet    Refill:  1   levothyroxine (SYNTHROID) 75 MCG tablet    Sig: Take 1 tablet (75 mcg total) by mouth daily.    Dispense:  90 tablet    Refill:  1   gabapentin (NEURONTIN) 300 MG capsule    Sig: Take 1 capsule (300 mg total) by mouth 3 (three) times daily.    Dispense:  280 capsule    Refill:  0

## 2023-08-31 NOTE — Assessment & Plan Note (Signed)
Continue PPI. Follow up with GI prn

## 2023-08-31 NOTE — Assessment & Plan Note (Signed)
Advised that she is at an increased risk of developing liver cancer and cirrhosis.  Recommend avoiding all alcohol and eating a low-fat diet.

## 2023-08-31 NOTE — Assessment & Plan Note (Signed)
Recommend low sugar and carbohydrate diet and increasing physical activity as tolerated.  Did not tolerate metformin, thought diarrhea caused by medication follow up pending A1c

## 2023-08-31 NOTE — Assessment & Plan Note (Signed)
Check labs 

## 2023-09-15 ENCOUNTER — Encounter: Payer: Self-pay | Admitting: Gastroenterology

## 2023-09-15 ENCOUNTER — Other Ambulatory Visit: Payer: PRIVATE HEALTH INSURANCE

## 2023-09-15 ENCOUNTER — Ambulatory Visit: Payer: PRIVATE HEALTH INSURANCE | Admitting: Gastroenterology

## 2023-09-15 VITALS — BP 104/70 | HR 100 | Ht 58.5 in | Wt 149.2 lb

## 2023-09-15 DIAGNOSIS — R7989 Other specified abnormal findings of blood chemistry: Secondary | ICD-10-CM | POA: Diagnosis not present

## 2023-09-15 DIAGNOSIS — K7581 Nonalcoholic steatohepatitis (NASH): Secondary | ICD-10-CM

## 2023-09-15 NOTE — Patient Instructions (Addendum)
Your provider has requested that you go to the basement level for lab work before leaving today. Press "B" on the elevator. The lab is located at the first door on the left as you exit the elevator.  Start Benefiber 2 teaspoons in 8 ounces of liquid daily.  Start Vitamin E 800 international units daily.   Come back in December 2024 for repeat liver function test.   _______________________________________________________  If your blood pressure at your visit was 140/90 or greater, please contact your primary care physician to follow up on this.  _______________________________________________________  If you are age 75 or older, your body mass index should be between 23-30. Your Body mass index is 30.66 kg/m. If this is out of the aforementioned range listed, please consider follow up with your Primary Care Provider.  If you are age 74 or younger, your body mass index should be between 19-25. Your Body mass index is 30.66 kg/m. If this is out of the aformentioned range listed, please consider follow up with your Primary Care Provider.   ________________________________________________________  The [facility] providers would like to encourage you to use [facility] to communicate with providers for non-urgent requests or questions.  Due to long hold times on the telephone, sending your provider a message by [facility] may be a faster and more efficient way to get a response.  Please allow 48 business hours for a response.  Please remember that this is for non-urgent requests.  _______________________________________________________

## 2023-09-15 NOTE — Progress Notes (Signed)
09/15/2023 Amy Mcintyre [id number] Aug 26, 1966   HISTORY OF PRESENT ILLNESS: This is a 57 year old female who is a patient of Dr. Lacretia Nicks.  She was seen by him in May.  [place] and is not planning to switch everything over until that new year.  She has been followed here for elevated LFTs.  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  Previous serologic workup negative.  Even had a liver biopsy earlier this year showing steatosis, but no fibrosis (results below).  Liver enzymes up further on most recent labs (08/31/23) with an AST of 185, ALT of 242.  Alk phos and total bili are normal.  This looks like the highest they have been at least over the past year.  She tells me that she had been off of her statin for probably at least 6 months leading up to these recent labs so this is not due to her statin therapy.  She is going to try to commit to watching her diet and doing some exercise.   She also reports some stool leakage.  Will clean herself after having a bowel movement then when she goes back later there is stool present sometimes in her underwear.  Last colonoscopy February 2023 with internal hemorrhoids, diverticulosis, and some areas of colitis appearing mucosa.  All biopsies were normal though.  Had a polyp removed as well, but was found to be benign colonic mucosa.  FINAL MICROSCOPIC DIAGNOSIS:  A. LIVER, NEEDLE CORE BIOPSY: Steatosis without fibrosis. See comment.  COMMENT:  The lobular hepatic architecture is preserved. The lobules reveal macrovesicular steatosis accounting for approximately 40% of overall parenchymal volume. Ballooned hepatocytes, distinct Mallory hyaline inclusions or acidophil bodies are not identified. There is no significant lobular inflammation. The portal tracts do not show evidence of significant portal inflammation, interlobular bile duct injury, bile ductular reaction, ductopenia or granulomas.   Trichrome stain does not show fibrosis. Iron is negative for iron deposit.  PAS/D stain is negative for diagnostic cytoplasmic globules. Reticulin highlights intact reticulin framework.  Overall, the histologic findings demonstrate moderate steatosis. As you know, steatosis is a pattern of injury that may be seen in association with a variety of conditions, including alcohol, metabolic syndrome, obesity, medication/drug-induced injury, Wilson's disease, among others. Correlation with clinical information is recommended.    Past Medical History:  Diagnosis Date   Allergy    Anxiety    Cataracts, bilateral    Colitis    Depression    Eosinophilic esophagitis    Fatty liver    Gastritis    GERD (gastroesophageal reflux disease)    Hyperlipidemia    Pre-diabetes    Sleep apnea    Thyroid disease    TMJ (dislocation of temporomandibular joint)    Past Surgical History:  Procedure Laterality Date   BREAST ENHANCEMENT SURGERY     CARPAL TUNNEL RELEASE Right 07/12/2022   Procedure: RIGHT CARPAL TUNNEL RELEASE;  Surgeon: Marlyne Beards, MD;  Location: [facility] OR;  Service: Orthopedics;  Laterality: Right;   CESAREAN SECTION     NASAL SINUS SURGERY     tummy tuck     WRIST SURGERY Left     reports that she has never smoked. She has never been exposed to tobacco smoke. She has never used smokeless tobacco. She reports current alcohol use. She reports that she does not use drugs. family history includes Alcohol abuse in her paternal grandfather; Anxiety disorder in her  father and mother; Arthritis in her maternal grandmother and mother; Asthma in her brother; COPD in her brother; Colon polyps in her father; Depression in her brother, father, maternal grandfather, mother, and paternal grandmother; Drug abuse in her brother; Heart disease in her father and paternal grandfather; High Cholesterol in her father and mother; High blood pressure in her father; OCD in her mother; Panic disorder in her  mother. Allergies  Allergen Reactions   Ambien [Zolpidem Tartrate] Other (See Comments)    Sleep walking   Erythromycin Swelling    Top lip swelled up.   Penicillins Swelling    Childhood reaction      Outpatient Encounter Medications as of 09/15/2023  Medication Sig   atorvastatin (LIPITOR) 40 MG tablet TAKE 1 TABLET BY MOUTH AT BEDTIME   brexpiprazole (REXULTI) 1 MG TABS tablet Take 1 tablet (1 mg total) by mouth daily. At bedtime   clonazePAM (KLONOPIN) 1 MG tablet TAKE 1/2 TO 1 (ONE-HALF TO ONE) TABLET BY MOUTH THREE TIMES DAILY AS NEEDED FOR PANIC ATTACK OR INSOMNIA   estradiol (ESTRACE) 1 MG tablet Take 1 mg by mouth at bedtime.   gabapentin (NEURONTIN) 300 MG capsule Take 1 capsule (300 mg total) by mouth 3 (three) times daily.   levothyroxine (SYNTHROID) 75 MCG tablet Take 1 tablet (75 mcg total) by mouth daily.   pantoprazole (PROTONIX) 40 MG tablet TAKE 1 TABLET BY MOUTH DAILY   progesterone (PROMETRIUM) 100 MG capsule Take 100 mg by mouth at bedtime.   sertraline (ZOLOFT) 100 MG tablet Take 2 tablets (200 mg total) by mouth daily.   No facility-administered encounter medications on file as of 09/15/2023.    REVIEW OF SYSTEMS  : All other systems reviewed and negative except where noted in the History of Present Illness.   PHYSICAL EXAM: BP 104/70 (BP Location: Left Arm, Patient Position: Sitting, Cuff Size: Normal)   Pulse 100   Ht 4' 10.5" (1.486 m) Comment: height measured without shoes  Wt 149 lb 4 oz (67.7 kg)   LMP 02/04/2019   BMI 30.66 kg/m  General: Well developed white female in no acute distress Head: Normocephalic and atraumatic Eyes:  Sclerae anicteric, conjunctiva pink. Ears: Normal auditory acuity Lungs: Clear throughout to auscultation; no W/R/R. Heart: Regular rate and rhythm; no M/R/G. Abdomen: Soft, non-distended.  BS present.  Mild TTP on the left side initially.  No hepatomegaly noted. Musculoskeletal: Symmetrical with no gross deformities   Skin: No lesions on visible extremities Extremities: No edema  Neurological: Alert oriented x 4, grossly non-focal Psychological:  Alert and cooperative. 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  Previous serologic workup negative.  Even had a liver biopsy earlier this year showing steatosis, but no fibrosis.  Liver enzymes up further on most recent labs with an AST of 185, ALT of 242.  Alk phos and total bili are normal.  This looks like the highest they have been at least over the past year.  There are a few things in the serologic workup that were not checked so we will do this for completion.  Will check an AMA, anti-smooth muscle antibody, ceruloplasmin, alpha 1 antitrypsin.  Will repeat viral hepatitis studies as well since those were last done in 2020.  She tells me that she had been off of her statin for probably at least 6 months leading up to these recent labs so this is not due to her statin therapy.  Maybe  the statin back in her regimen will help.  We also discussed adding vitamin E 800 international units daily.  She is going to try to commit to watching her diet and doing some exercise.  Will repeat LFTs again in December, which will be 3 months from the last ones which she just had done a couple weeks ago. *Stool leakage: May have some pelvic floor weakness and decreased sphincter tone.  Will clean herself after having a bowel movement then when she goes back later there is stool present sometimes in her underwear.  We discussed pelvic floor physical therapy and that may be an option for her, but [place].  She is going to switch all of her doctors down there after the new year.  In the meantime she can try a powder fiber supplement such as Benefiber starting with 2 teaspoons in 8 ounces of liquid daily and increasing to twice daily if needed.   CC:  Henson, Vickie  L, NP-C

## 2023-09-16 ENCOUNTER — Other Ambulatory Visit: Payer: Self-pay | Admitting: *Deleted

## 2023-09-16 DIAGNOSIS — K7581 Nonalcoholic steatohepatitis (NASH): Secondary | ICD-10-CM

## 2023-09-16 DIAGNOSIS — R7989 Other specified abnormal findings of blood chemistry: Secondary | ICD-10-CM

## 2023-09-17 LAB — HEPATITIS C ANTIBODY: Hepatitis C Ab: NONREACTIVE

## 2023-09-17 LAB — HEPATITIS B CORE ANTIBODY, TOTAL: Hep B Core Total Ab: NONREACTIVE

## 2023-09-17 LAB — MITOCHONDRIAL ANTIBODIES: Mitochondrial M2 Ab, IgG: <=20 U (ref <=20.0)

## 2023-09-17 LAB — HEPATITIS B SURFACE ANTIGEN: Hepatitis B Surface Ag: NONREACTIVE

## 2023-09-17 LAB — ANTI-SMOOTH MUSCLE ANTIBODY, IGG: Actin (Smooth Muscle) Antibody (IGG): <20 U (ref <20)

## 2023-09-17 LAB — CERULOPLASMIN: Ceruloplasmin: 27 mg/dL (ref 14–48)

## 2023-09-17 LAB — ALPHA-1-ANTITRYPSIN: A-1 Antitrypsin, Ser: 162 mg/dL (ref 83–199)

## 2023-09-27 ENCOUNTER — Ambulatory Visit: Payer: PRIVATE HEALTH INSURANCE | Admitting: Psychiatry

## 2023-09-29 ENCOUNTER — Encounter: Payer: Self-pay | Admitting: Psychiatry

## 2023-09-29 ENCOUNTER — Ambulatory Visit (INDEPENDENT_AMBULATORY_CARE_PROVIDER_SITE_OTHER): Payer: PRIVATE HEALTH INSURANCE | Admitting: Psychiatry

## 2023-09-29 VITALS — Wt 151.0 lb

## 2023-09-29 DIAGNOSIS — F331 Major depressive disorder, recurrent, moderate: Secondary | ICD-10-CM | POA: Diagnosis not present

## 2023-09-29 DIAGNOSIS — F5101 Primary insomnia: Secondary | ICD-10-CM

## 2023-09-29 DIAGNOSIS — F332 Major depressive disorder, recurrent severe without psychotic features: Secondary | ICD-10-CM

## 2023-09-29 DIAGNOSIS — F411 Generalized anxiety disorder: Secondary | ICD-10-CM

## 2023-09-29 MED ORDER — CLONAZEPAM 1 MG PO TABS: ORAL_TABLET | ORAL | 2 refills | Status: DC

## 2023-09-29 MED ORDER — BREXPIPRAZOLE 1 MG PO TABS
1.0000 mg | ORAL_TABLET | Freq: Every day | ORAL | 0 refills | Status: DC
Start: 2023-09-29 — End: 2023-10-27

## 2023-09-29 MED ORDER — HYDROXYZINE PAMOATE 25 MG PO CAPS
25.0000 mg | ORAL_CAPSULE | Freq: Every evening | ORAL | 2 refills | Status: DC | PRN
Start: 2023-09-29 — End: 2023-10-27

## 2023-09-29 NOTE — Progress Notes (Signed)
Amy Mcintyre [id number] 1966/01/01 57 y.o.  Subjective:   Patient ID:  Amy Mcintyre is a 57 y.o. (DOB [birth year]) female.  Chief Complaint:  Chief Complaint  Patient presents with   Follow-up    Depression, anxiety, and insomnia    HPI Amy Mcintyre presents to the office today for follow-up of depression, anxiety, and insomnia. She reports that she re-started Rexulti around early October and notices some improvements. She reports that others have commented that they notice an improvement in her mood and crying. She reports that she is crying less and no longer crying daily. She reports that hormones also seem to be helping her symptoms. She reports some irritability and that this may be related to stressors. She reports difficulty falling and staying asleep. She was awake at 3 am this morning. Taking Klonopin 1 mg at bedtime and during the daytime for situational anxiety. She reports feeling tired. She reports that she is more interested in doing things. She reports motivation has improved. Concentration is adequate. No change in appetite and has been trying to eat healthier foods. She has also been increasing physical activity. Denies SI.   Relationship stressors.   Plan is to increase Estradiol.  Past Psychiatric Medication Trials: Paxil Prozac Sertraline Nortriptyline Wellbutrin- not helpful for depression.  Doxepin BuSpar Abilify-increased heart rate Rexulti-Helpful for mood and anxiety Seroquel-adverse effects Gabapentin- takes for jaw pain Lunesta-not effective Ambien- parasomnias Trazodone-ineffective Belsomra-worsening insomnia Klonopin  AIMS    Flowsheet Row Office Visit from 09/29/2023 in [facility] Office Visit from 05/25/2022 in [facility] Office Visit from 01/29/2020 in [facility] Office Visit from 10/04/2019 in [facility]  AIMS Total Score 0 0 0 0       AUDIT    Flowsheet Row Office Visit from 08/31/2023 in [facility]  Alcohol Use Disorder Identification Test Final Score (AUDIT) 4       PHQ2-9    Flowsheet Row Office Visit from 08/31/2023 in [facility] Office Visit from 07[facility]28/2022 in [facility] Office Visit from 05/05/2022 in [facility]  PHQ-2 Total Score 6 2 0  PHQ-9 Total Score 8 4 1       Flowsheet Row [facility] from 03/17/2023 in [facility] Admission (Discharged) from 07/12/2022 in [facility]  C-SSRS RISK CATEGORY No Risk No Risk        Review of Systems:  Review of Systems  Musculoskeletal:  Negative for gait problem.  Neurological:  Negative for tremors.  Psychiatric/Behavioral:         Please refer to HPI    Medications: I have reviewed the patient's current medications.  Current Outpatient Medications  Medication Sig Dispense Refill   atorvastatin (LIPITOR) 40 MG tablet TAKE 1 TABLET BY MOUTH AT BEDTIME 90 tablet 0   estradiol (ESTRACE) 1 MG tablet Take 1 mg by mouth at bedtime.     gabapentin (NEURONTIN) 300 MG capsule Take 1 capsule (300 mg total) by mouth 3 (three) times daily. 280 capsule 0   hydrOXYzine (VISTARIL) 25 MG capsule Take 1 capsule (25 mg total) by mouth at bedtime as needed (Insomnia). 30 capsule 2   levothyroxine (SYNTHROID) 75 MCG tablet Take 1 tablet (75 mcg total) by mouth daily. 90 tablet 1   pantoprazole (PROTONIX) 40 MG tablet TAKE 1  TABLET BY MOUTH DAILY 90 tablet 1   progesterone (PROMETRIUM) 100 MG capsule Take 100 mg by mouth at bedtime.     sertraline (ZOLOFT) 100 MG tablet Take 2 tablets (200 mg total) by mouth daily. 180 tablet 1   Brexpiprazole (REXULTI) 0.5 MG TABS Take 1 tablet (0.5 mg total) by mouth daily for 7 days.     brexpiprazole (REXULTI) 1 MG TABS tablet Take 1 tablet (1 mg  total) by mouth daily. At bedtime 90 tablet 0   [START ON 10/20/2023] clonazePAM (KLONOPIN) 1 MG tablet TAKE 1/2 TO 1 (ONE-HALF TO ONE) TABLET BY MOUTH THREE TIMES DAILY AS NEEDED FOR PANIC ATTACK OR INSOMNIA 90 tablet 2   No current facility-administered medications for this visit.    Medication Side Effects: Other: wt gain  Allergies:  Allergies  Allergen Reactions   Ambien [Zolpidem Tartrate] Other (See Comments)    Sleep walking   Erythromycin Swelling    Top lip swelled up.   Penicillins Swelling    Childhood reaction    Past Medical History:  Diagnosis Date   Allergy    Anxiety    Cataracts, bilateral    Colitis    Depression    Eosinophilic esophagitis    Fatty liver    Gastritis    GERD (gastroesophageal reflux disease)    Hyperlipidemia    Pre-diabetes    Sleep apnea    Thyroid disease    TMJ (dislocation of temporomandibular joint)     Past Medical History, Surgical history, Social history, and Family history were reviewed and updated as appropriate.   Please see review of systems for further details on the patient's review from today.   Objective:   Physical Exam:  Wt 151 lb (68.5 kg)   LMP 02/04/2019   BMI 31.02 kg/m   Physical Exam Constitutional:      General: She is not in acute distress. Musculoskeletal:        General: No deformity.  Neurological:     Mental Status: She is alert and oriented to person, place, and time.     Coordination: Coordination normal.  Psychiatric:        Attention and Perception: Attention and perception normal. She does not perceive auditory or visual hallucinations.        Mood and Affect: Affect is not labile, blunt, angry or inappropriate.        Speech: Speech normal.        Behavior: Behavior normal.        Thought Content: Thought content normal. Thought content is not paranoid or delusional. Thought content does not include homicidal or suicidal ideation. Thought content does not include homicidal or  suicidal plan.        Cognition and Memory: Cognition and memory normal.        Judgment: Judgment normal.     Comments: Insight intact Mood presents as less anxious and less depressed compared to last visit. Pt is more forward thinking.      Lab Review:     Component Value Date/Time   NA 138 08/31/2023 1403   K 4.1 08/31/2023 1403   CL 97 08/31/2023 1403   CO2 30 08/31/2023 1403   GLUCOSE 104 (H) 08/31/2023 1403   BUN 8 08/31/2023 1403   CREATININE 0.75 08/31/2023 1403   CALCIUM 9.9 08/31/2023 1403   PROT 7.3 08/31/2023 1403   PROT 7.1 10/21/2022 0914   ALBUMIN 4.6 08/31/2023 1403   ALBUMIN 4.9 10/21/2022 0914  AST 185 (H) 08/31/2023 1403   ALT 242 (H) 08/31/2023 1403   ALT 39 (H) 04/18/2019 0821   ALKPHOS 104 08/31/2023 1403   BILITOT 0.4 08/31/2023 1403   BILITOT 0.2 10/21/2022 0914   GFRNONAA >60 03/17/2023 1152       Component Value Date/Time   WBC 6.0 08/31/2023 1403   RBC 4.21 08/31/2023 1403   HGB 13.3 08/31/2023 1403   HCT 40.8 08/31/2023 1403   PLT 200.0 08/31/2023 1403   MCV 97.0 08/31/2023 1403   MCH 30.6 03/17/2023 1152   MCHC 32.7 08/31/2023 1403   RDW 15.5 08/31/2023 1403   LYMPHSABS 1.5 08/31/2023 1403   MONOABS 0.4 08/31/2023 1403   EOSABS 0.0 08/31/2023 1403   BASOSABS 0.0 08/31/2023 1403    No results found for: "POCLITH", "LITHIUM"   No results found for: "PHENYTOIN", "PHENOBARB", "VALPROATE", "CBMZ"   .res Assessment: Plan:    Discussed response to Rexulti and pt reports that she and other are noticing improvements in her mood and crying episodes. Discussed option of continuing Rexulti 1 mg or increasing dose to 2 mg daily. Pt reports that she prefers to continue current dose of Rexulti 1 mg daily.  Discussed that insomnia is currently her chief concern. Discussed potential benefits, risks, and side effects of Hydroxyzine for insomnia. Pt agrees to trial of hydroxyzine. Will start Hydroxyzine 25 mg po at bedtime prn insomnia. Advised pt  to contact office if insomnia does not improve.  Continue Sertraline 200 mg daily for anxiety and depression.  Continue Klonopin as needed for insomnia.  Pt to follow-up with this provider in 4 weeks or sooner if clinically indicated.  Recommend continuing psychotherapy.  Patient advised to contact office with any questions, adverse effects, or acute worsening in signs and symptoms.   Moises was seen today for follow-up.  Diagnoses and all orders for this visit:  Moderate episode of recurrent major depressive disorder (HCC) -     brexpiprazole (REXULTI) 1 MG TABS tablet; Take 1 tablet (1 mg total) by mouth daily. At bedtime  Primary insomnia -     hydrOXYzine (VISTARIL) 25 MG capsule; Take 1 capsule (25 mg total) by mouth at bedtime as needed (Insomnia). -     clonazePAM (KLONOPIN) 1 MG tablet; TAKE 1/2 TO 1 (ONE-HALF TO ONE) TABLET BY MOUTH THREE TIMES DAILY AS NEEDED FOR PANIC ATTACK OR INSOMNIA  Generalized anxiety disorder -     clonazePAM (KLONOPIN) 1 MG tablet; TAKE 1/2 TO 1 (ONE-HALF TO ONE) TABLET BY MOUTH THREE TIMES DAILY AS NEEDED FOR PANIC ATTACK OR INSOMNIA     Please see After Visit Summary for patient specific instructions.  Future Appointments  Date Time Provider Department Center  10/27/2023 11:00 AM Corie Chiquito, PMHNP [facility] None    No orders of the defined types were placed in this encounter.   -------------------------------

## 2023-10-19 ENCOUNTER — Encounter: Payer: Self-pay | Admitting: Psychiatry

## 2023-10-27 ENCOUNTER — Ambulatory Visit (INDEPENDENT_AMBULATORY_CARE_PROVIDER_SITE_OTHER): Payer: PRIVATE HEALTH INSURANCE | Admitting: Psychiatry

## 2023-10-27 ENCOUNTER — Encounter: Payer: Self-pay | Admitting: Psychiatry

## 2023-10-27 DIAGNOSIS — F411 Generalized anxiety disorder: Secondary | ICD-10-CM

## 2023-10-27 DIAGNOSIS — F331 Major depressive disorder, recurrent, moderate: Secondary | ICD-10-CM | POA: Diagnosis not present

## 2023-10-27 DIAGNOSIS — F5101 Primary insomnia: Secondary | ICD-10-CM

## 2023-10-27 MED ORDER — BREXPIPRAZOLE 1 MG PO TABS: 1.0000 mg | ORAL_TABLET | Freq: Every day | ORAL | 0 refills | Status: DC

## 2023-10-27 MED ORDER — HYDROXYZINE PAMOATE 25 MG PO CAPS: 25.0000 mg | ORAL_CAPSULE | Freq: Every evening | ORAL | 2 refills | Status: DC | PRN

## 2023-10-27 MED ORDER — SERTRALINE HCL 100 MG PO TABS: 200.0000 mg | ORAL_TABLET | Freq: Every day | ORAL | 1 refills | Status: DC

## 2023-10-27 NOTE — Progress Notes (Signed)
Amy Mcintyre [id number] 1966-09-17 57 y.o.  Subjective:   Patient ID:  Amy Mcintyre is a 57 y.o. (DOB [birth year]) female.  Chief Complaint:  Chief Complaint  Patient presents with   Anxiety   Depression   Sleeping Problem    HPI Amy Mcintyre presents to the office today for follow-up of depression and anxiety.   "I'm better, I think, but I'm crying too much." She reports that at times she is crying in response to known trigger and other times she is not sure why she is crying. She reports that her motivation is low. Energy is also low.She reports that she spends most of her time in her pajamas reading or watching TV. "I have to force myself to do some of the things that I know will help." She reports that she did not sleep at all the first night she stayed with her mother. She reports that she has been sleeping better overall. She has been dreaming about deceased relatives. Denies anhedonia and reports that she occasionally will enjoy things. She reports adequate concentration for reading. She will listen to audiobooks in the car. Denies change in appetite. Concerned about weight gain. Denies SI.   Had severe panic attack yesterday. She reports that she has had periodic panic attacks and notices her breath will "quicken" and will feel "shaky." She reports frequent worry to include worry about the future, finances, etc. She reports feeling "overwhelmed by the smallest task." She reports some anxiety with going places and is fearful that she will get lost with detours near her new home due to road construction. She reports that she has anxiety about being alone.  She reports continued relationship stress.   She has been visiting her mother since Tuesday. She is planning to go to her daughter's house for Thanksgiving. Daughter is doing well.   She talks with her mother and a friend she used to work with.   Reports that she snores and does nto want to wear a cPap.   Past Psychiatric  Medication Trials: Paxil Prozac Sertraline Nortriptyline Wellbutrin- not helpful for depression.  Doxepin BuSpar Abilify-increased heart rate Rexulti-Helpful for mood and anxiety Seroquel-adverse effects Gabapentin- takes for jaw pain Lunesta-not effective Ambien- parasomnias Trazodone-ineffective Belsomra-worsening insomnia Klonopin  AIMS    Flowsheet Row Office Visit from 09/29/2023 in [facility] Office Visit from 05/25/2022 in [facility] Office Visit from 01/29/2020 in [facility] Office Visit from 10/04/2019 in [facility]  AIMS Total Score 0 0 0 0      AUDIT    Flowsheet Row Office Visit from 08/31/2023 in [facility]  Alcohol Use Disorder Identification Test Final Score (AUDIT) 4       PHQ2-9    Flowsheet Row Office Visit from 08/31/2023 in [facility] Office Visit from 07/28/2022 in [facility] Office Visit from 05/05/2022 in [facility]  PHQ-2 Total Score 6 2 0  PHQ-9 Total Score 8 4 1       Flowsheet Row US BIOPSY [facility] from 03/17/2023 in [facility] Admission (Discharged) from 07/12/2022 in  PERIOPERATIVE AREA  C-SSRS RISK CATEGORY No Risk No Risk        Review of Systems:  Review of Systems  Constitutional:  Negative for fever.       Night sweats  HENT:  Positive for postnasal drip.   Respiratory:  Positive for chest tightness and wheezing.   Musculoskeletal:  Negative for gait problem.       Jaw pain from clinching  Psychiatric/Behavioral:         Please refer to HPI    Medications: I have reviewed the patient's current medications.  Current Outpatient Medications  Medication Sig Dispense Refill   estradiol (ESTRACE) 1 MG tablet Take 1 mg by mouth at bedtime.      progesterone (PROMETRIUM) 100 MG capsule Take 100 mg by mouth at bedtime.     atorvastatin (LIPITOR) 40 MG tablet TAKE 1 TABLET BY MOUTH AT BEDTIME 90 tablet 0   brexpiprazole (REXULTI) 1 MG TABS tablet Take 1 tablet (1 mg total) by mouth daily. At bedtime 90 tablet 0   clonazePAM (KLONOPIN) 1 MG tablet TAKE 1/2 TO 1 (ONE-HALF TO ONE) TABLET BY MOUTH THREE TIMES DAILY AS NEEDED FOR PANIC ATTACK OR INSOMNIA 90 tablet 2   gabapentin (NEURONTIN) 300 MG capsule Take 1 capsule (300 mg total) by mouth 3 (three) times daily. 280 capsule 0   hydrOXYzine (VISTARIL) 25 MG capsule Take 1 capsule (25 mg total) by mouth at bedtime as needed (Insomnia). 30 capsule 2   levothyroxine (SYNTHROID) 75 MCG tablet Take 1 tablet (75 mcg total) by mouth daily. 90 tablet 1   pantoprazole (PROTONIX) 40 MG tablet TAKE 1 TABLET BY MOUTH DAILY 90 tablet 1   sertraline (ZOLOFT) 100 MG tablet Take 2 tablets (200 mg total) by mouth daily. 180 tablet 1   No current facility-administered medications for this visit.    Medication Side Effects: Other: Weight gain and dry mouth  Allergies:  Allergies  Allergen Reactions   Ambien [Zolpidem Tartrate] Other (See Comments)    Sleep walking   Erythromycin Swelling    Top lip swelled up.   Penicillins Swelling    Childhood reaction    Past Medical History:  Diagnosis Date   Allergy    Anxiety    Cataracts, bilateral    Colitis    Depression    Eosinophilic esophagitis    Fatty liver    Gastritis    GERD (gastroesophageal reflux disease)    Hyperlipidemia    Pre-diabetes    Sleep apnea    Thyroid disease    TMJ (dislocation of temporomandibular joint)     Past Medical History, Surgical history, Social history, and Family history were reviewed and updated as appropriate.   Please see review of systems for further details on the patient's review from today.   Objective:   Physical Exam:  Wt 156 lb (70.8 kg)   LMP 02/04/2019   BMI 32.05 kg/m    Physical Exam Constitutional:      General: She is not in acute distress. Musculoskeletal:        General: No deformity.  Neurological:     Mental Status: She is alert and oriented to person, place, and time.     Coordination: Coordination normal.  Psychiatric:        Attention and Perception: Attention and perception normal. She does not perceive auditory or visual hallucinations.        Mood and Affect: Mood is anxious and depressed. Affect is not labile, blunt, angry or inappropriate.        Speech: Speech normal.        Behavior: Behavior normal.        Thought Content: Thought content normal. Thought content is  not paranoid or delusional. Thought content does not include homicidal or suicidal ideation. Thought content does not include homicidal or suicidal plan.        Cognition and Memory: Cognition and memory normal.        Judgment: Judgment normal.     Comments: Insight intact     Lab Review:     Component Value Date/Time   NA 138 08/31/2023 1403   K 4.1 08/31/2023 1403   CL 97 08/31/2023 1403   CO2 30 08/31/2023 1403   GLUCOSE 104 (H) 08/31/2023 1403   BUN 8 08/31/2023 1403   CREATININE 0.75 08/31/2023 1403   CALCIUM 9.9 08/31/2023 1403   PROT 7.3 08/31/2023 1403   PROT 7.1 10/21/2022 0914   ALBUMIN 4.6 08/31/2023 1403   ALBUMIN 4.9 10/21/2022 0914   AST 185 (H) 08/31/2023 1403   ALT 242 (H) 08/31/2023 1403   ALT 39 (H) 04/18/2019 0821   ALKPHOS 104 08/31/2023 1403   BILITOT 0.4 08/31/2023 1403   BILITOT 0.2 10/21/2022 0914   GFRNONAA >60 03/17/2023 1152       Component Value Date/Time   WBC 6.0 08/31/2023 1403   RBC 4.21 08/31/2023 1403   HGB 13.3 08/31/2023 1403   HCT 40.8 08/31/2023 1403   PLT 200.0 08/31/2023 1403   MCV 97.0 08/31/2023 1403   MCH 30.6 03/17/2023 1152   MCHC 32.7 08/31/2023 1403   RDW 15.5 08/31/2023 1403   LYMPHSABS 1.5 08/31/2023 1403   MONOABS 0.4 08/31/2023 1403   EOSABS 0.0 08/31/2023 1403   BASOSABS 0.0 08/31/2023 1403     No results found for: "POCLITH", "LITHIUM"   No results found for: "PHENYTOIN", "PHENOBARB", "VALPROATE", "CBMZ"   .res Assessment: Plan:   35 minutes spent dedicated to the care of this patient on the date of this encounter to include pre-visit review of records, ordering of medication, post visit documentation, and face-to-face time with the patient discussing treatment plan and possible treatment options. Discussed increasing Rexulti to 2 mg daily to further improve mood and anxiety. Pt reports that she prefers not to increase Rexulti due to weight gain. Discussed option of discontinuing Rexulti due to to weight gain and starting Vraylar due to less risk of weight gain. Discussed potential benefits, risks, and side effects of Auvelity as another treatment option for depression.  Pt reports that she would like to work on making changes to her daily routine, to include getting showered and dressed every morning, and trying to increase physical activity and develop a hobby/interest.  Will continue Rexulti 1 mg daily for augmentation of depression. Continue Sertraline 200 mg daily for anxiety and depression.  Continue Klonopin 1 mg 1/2-1 tablet three times daily as needed for panic or insomnia.  Continue Hydroxyzine 25 mg at bedtime for insomnia.  Pt to follow-up with this provider in 4 weeks or sooner if clinically indicated.  Recommend continuing psychotherapy.  Patient advised to contact office with any questions, adverse effects, or acute worsening in signs and symptoms.  Amy Mcintyre was seen today for anxiety, depression and sleeping problem.  Diagnoses and all orders for this visit:  Moderate episode of recurrent major depressive disorder (HCC) -     brexpiprazole (REXULTI) 1 MG TABS tablet; Take 1 tablet (1 mg total) by mouth daily. At bedtime -     sertraline (ZOLOFT) 100 MG tablet; Take 2 tablets (200 mg total) by mouth daily.  Primary insomnia -     hydrOXYzine (VISTARIL) 25 MG  capsule; Take  1 capsule (25 mg total) by mouth at bedtime as needed (Insomnia).  Generalized anxiety disorder -     sertraline (ZOLOFT) 100 MG tablet; Take 2 tablets (200 mg total) by mouth daily.     Please see After Visit Summary for patient specific instructions.  Future Appointments  Date Time Provider Department Center  11/25/2023  9:00 AM Corie Chiquito, PMHNP [facility] None    No orders of the defined types were placed in this encounter.   -------------------------------

## 2023-10-31 ENCOUNTER — Telehealth: Payer: Self-pay | Admitting: Psychiatry

## 2023-10-31 DIAGNOSIS — F411 Generalized anxiety disorder: Secondary | ICD-10-CM

## 2023-10-31 DIAGNOSIS — F331 Major depressive disorder, recurrent, moderate: Secondary | ICD-10-CM

## 2023-10-31 MED ORDER — SERTRALINE HCL 100 MG PO TABS: 150.0000 mg | ORAL_TABLET | Freq: Every day | ORAL | Status: DC

## 2023-10-31 MED ORDER — DULOXETINE HCL 30 MG PO CPEP: 30.0000 mg | ORAL_CAPSULE | Freq: Every day | ORAL | 0 refills | Status: DC

## 2023-10-31 NOTE — Telephone Encounter (Signed)
Next appt is 11/25/23. Mcclain would like alternatives to atypical antipsychotics. Should she titrate off of Rexulti and take Cymbalta with Zoloft? Her number is [phone].

## 2023-10-31 NOTE — Telephone Encounter (Signed)
Patient has been reading on medications and read that Rexulti can cause TD and she said she is not going to take an atypical antipsychotic. She would rather risk serotonin syndrome versus TD. She realizes that she needs an adjunct to Zoloft and is asking to try Cymbalta. She said she had talked with others in the field and that Cymbalta was recommended. She said she would take whatever medication you recommended, just not an atypical antipsychotic. She was seen 11/21.

## 2023-10-31 NOTE — Telephone Encounter (Signed)
Please let her know she can just stop Rexulti since it has a long half-life and will therefore gradually wash out over the next few weeks. Can start trial of Cymbalta. Will send in Cymbalta 30 mg capsules on daily in the morning. Please advise her to reduce Sertraline to 150 mg daily. Side effects with Cymbalta could include headaches initially and possibly hot flashes.

## 2023-10-31 NOTE — Telephone Encounter (Signed)
Patient informed of the recommendations noted.

## 2023-11-24 ENCOUNTER — Other Ambulatory Visit: Payer: PRIVATE HEALTH INSURANCE

## 2023-11-24 DIAGNOSIS — K7581 Nonalcoholic steatohepatitis (NASH): Secondary | ICD-10-CM | POA: Diagnosis not present

## 2023-11-24 DIAGNOSIS — R7989 Other specified abnormal findings of blood chemistry: Secondary | ICD-10-CM

## 2023-11-24 LAB — HEPATIC FUNCTION PANEL
ALT: 115 U/L — ABNORMAL HIGH (ref 0–35)
AST: 89 U/L — ABNORMAL HIGH (ref 0–37)
Albumin: 4.6 g/dL (ref 3.5–5.2)
Alkaline Phosphatase: 118 U/L — ABNORMAL HIGH (ref 39–117)
Bilirubin, Direct: 0.2 mg/dL (ref 0.0–0.3)
Total Bilirubin: 0.6 mg/dL (ref 0.2–1.2)
Total Protein: 7.4 g/dL (ref 6.0–8.3)

## 2023-11-25 ENCOUNTER — Telehealth (INDEPENDENT_AMBULATORY_CARE_PROVIDER_SITE_OTHER): Payer: PRIVATE HEALTH INSURANCE | Admitting: Psychiatry

## 2023-11-25 ENCOUNTER — Encounter: Payer: Self-pay | Admitting: Psychiatry

## 2023-11-25 DIAGNOSIS — F5101 Primary insomnia: Secondary | ICD-10-CM

## 2023-11-25 DIAGNOSIS — F332 Major depressive disorder, recurrent severe without psychotic features: Secondary | ICD-10-CM | POA: Diagnosis not present

## 2023-11-25 DIAGNOSIS — F411 Generalized anxiety disorder: Secondary | ICD-10-CM | POA: Diagnosis not present

## 2023-11-25 MED ORDER — CLONAZEPAM 1 MG PO TABS: ORAL_TABLET | ORAL | 3 refills | Status: DC

## 2023-11-25 MED ORDER — DULOXETINE HCL 60 MG PO CPEP: 60.0000 mg | ORAL_CAPSULE | Freq: Every day | ORAL | 1 refills | Status: DC

## 2023-11-25 MED ORDER — SERTRALINE HCL 100 MG PO TABS: ORAL_TABLET | ORAL | Status: DC

## 2023-11-25 NOTE — Progress Notes (Signed)
Amy Mcintyre [id number] January 06, 1966 57 y.o.  Virtual Visit via Video Note  I connected with pt @ on 11/25/23 at  9:00 AM EST by a video enabled telemedicine application and verified that I am speaking with the correct person using two identifiers.   I discussed the limitations of evaluation and management by telemedicine and the availability of in person appointments. The patient expressed understanding and agreed to proceed.  I discussed the assessment and treatment plan with the patient. The patient was provided an opportunity to ask questions and all were answered. The patient agreed with the plan and demonstrated an understanding of the instructions.   The patient was advised to call back or seek an in-person evaluation if the symptoms worsen or if the condition fails to improve as anticipated.  I provided 32 minutes of non-face-to-face time during this encounter.  The patient was located at home.  The provider was located at [facility].   Corie Chiquito, PMHNP   Subjective:   Patient ID:  Amy Mcintyre is a 57 y.o. (DOB [birth year]) female.  Chief Complaint:  Chief Complaint  Patient presents with   Depression   Anxiety    HPI Amy Mcintyre presents for follow-up of anxiety, depression, and insomnia. She reports, "I'm not doing well. Probably the worst I have ever been in my life." She reports that her depression and anxiety seem to be worsening. Started Paxil in her 57's and took it for years.   She reports, "I get real anxious and overwhelmed and my heart is beating fast" with increased anxiety. She has been focusing on her breathing during times of increased anxiety. She reports anxiety and worry. She reports catastrophic thoughts about losing her loved ones. She reports that she is having chronically elevated anxiety. She reports increased anxiety with things that usually do not cause anxiety, such as making telephone calls. Mood has been persistently sad. She reports  that she is easily "teary." She reports jaw tightness. Low energy and motivation- "all I want to do is sit at home in my pajamas." She reports diminished interest and enjoyment in things. She reports difficulty with concentration. She had to replay an audio book multiple times. She reports difficulty falling and staying asleep. Slept 5.5 hours last night. She reports that her appetite has been less and that her mother commented on this.   She reports suicidal ideation without plan or intent- "I don't want to die." She reports that she was recently sleepy behind the wheel and "realized I don't want to die." Contracts for safety.   Her daughter told her that she would continue to call and text her, but that she did not want to see her in person because it was difficult for her to see this distressed.   She is continuing with counseling and saw her counselor yesterday. Plans to join a [employer]. Has considered starting an art group and going to a yoga class. 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 She would like to join a church. "These are things I see myself doing, if I felt like it."    Klonopin last filled 10/23/23.   Past Psychiatric Medication Trials: Paxil Prozac Sertraline Nortriptyline Wellbutrin- not helpful for depression.  Doxepin BuSpar Abilify-increased heart rate Rexulti-Helpful for mood and anxiety Seroquel-adverse effects Gabapentin- takes for jaw pain Lunesta-not effective Ambien- parasomnias Trazodone-ineffective Belsomra-worsening insomnia Klonopin   Review of Systems:  Review of Systems  Genitourinary:        She reports that she  had a very heavy period and was advised to stop estrogen and progesterone  Musculoskeletal:  Negative for gait problem.  Neurological:  Negative for tremors.  Psychiatric/Behavioral:         Please refer to HPI    Medications: I have reviewed the patient's current medications.  Current Outpatient Medications  Medication Sig  Dispense Refill   atorvastatin (LIPITOR) 40 MG tablet TAKE 1 TABLET BY MOUTH AT BEDTIME 90 tablet 0   gabapentin (NEURONTIN) 300 MG capsule Take 1 capsule (300 mg total) by mouth 3 (three) times daily. 280 capsule 0   levothyroxine (SYNTHROID) 75 MCG tablet Take 1 tablet (75 mcg total) by mouth daily. 90 tablet 1   pantoprazole (PROTONIX) 40 MG tablet TAKE 1 TABLET BY MOUTH DAILY 90 tablet 1   [START ON 01/20/2024] clonazePAM (KLONOPIN) 1 MG tablet TAKE 1/2 TO 1 (ONE-HALF TO ONE) TABLET BY MOUTH THREE TIMES DAILY AS NEEDED FOR PANIC ATTACK OR INSOMNIA 90 tablet 3   DULoxetine (CYMBALTA) 60 MG capsule Take 1 capsule (60 mg total) by mouth daily. 90 capsule 1   sertraline (ZOLOFT) 100 MG tablet Take 1 tablet daily for one week, then 1/2 tablet daily for one week, then stop     No current facility-administered medications for this visit.    Medication Side Effects: Other: Dry mouth  Allergies:  Allergies  Allergen Reactions   Ambien [Zolpidem Tartrate] Other (See Comments)    Sleep walking   Erythromycin Swelling    Top lip swelled up.   Penicillins Swelling    Childhood reaction    Past Medical History:  Diagnosis Date   Allergy    Anxiety    Cataracts, bilateral    Colitis    Depression    Eosinophilic esophagitis    Fatty liver    Gastritis    GERD (gastroesophageal reflux disease)    Hyperlipidemia    Pre-diabetes    Sleep apnea    Thyroid disease    TMJ (dislocation of temporomandibular joint)     Family History  Problem Relation Age of Onset   Anxiety disorder Mother    Panic disorder Mother    OCD Mother    Depression Mother    Arthritis Mother    High Cholesterol Mother    Anxiety disorder Father    Depression Father    Colon polyps Father    Heart disease Father    High Cholesterol Father    High blood pressure Father    Drug abuse Brother    Asthma Brother    COPD Brother    Depression Brother    Arthritis Maternal Grandmother    Depression Maternal  Grandfather    Depression Paternal Grandmother    Alcohol abuse Paternal Grandfather    Heart disease Paternal Grandfather    Colon cancer Neg Hx    Esophageal cancer Neg Hx    Pancreatic cancer Neg Hx    Stomach cancer Neg Hx    Liver disease Neg Hx    Rectal cancer Neg Hx     Social History   Socioeconomic History   Marital status: Single    Spouse name: Not on file   Number of children: 1   Years of education: Not on file   Highest education level: Master's degree (e.g., MA, MS, MEng, MEd, MSW, MBA)  Occupational History   Occupation: MANAGER  Tobacco Use   Smoking status: Never    Passive exposure: Never   Smokeless tobacco: Never  Vaping Use   Vaping status: Never Used  Substance and Sexual Activity   Alcohol use: Yes    Comment: socially   Drug use: Never   Sexual activity: Not Currently  Other Topics Concern   Not on file  Social History Narrative   Not on file   Social Drivers of Health   Financial Resource Strain: Low Risk  (08/27/2023)   Overall Financial Resource Strain (CARDIA)    Difficulty of Paying Living Expenses: Not hard at all  Food Insecurity: No Food Insecurity (08/27/2023)   Hunger Vital Sign    Worried About Running Out of Food in the Last Year: Never true    Ran Out of Food in the Last Year: Never true  Transportation Needs: No Transportation Needs (08/27/2023)   PRAPARE - Administrator, Civil Service (Medical): No    Lack of Transportation (Non-Medical): No  Physical Activity: Insufficiently Active (08/27/2023)   Exercise Vital Sign    Days of Exercise per Week: 1 day    Minutes of Exercise per Session: 20 min  Stress: Stress Concern Present (08/27/2023)   [employer] of Occupational Health - Occupational Stress Questionnaire    Feeling of Stress : Very much  Social Connections: Unknown (08/27/2023)   Social Connection and Isolation Panel [NHANES]    Frequency of Communication with Friends and Family: Twice a week     Frequency of Social Gatherings with Friends and Family: Patient declined    Attends Religious Services: Never    Database administrator or Organizations: No    Attends Engineer, structural: Not on file    Marital Status: Divorced  Catering manager Violence: Not on file    Past Medical History, Surgical history, Social history, and Family history were reviewed and updated as appropriate.   Please see review of systems for further details on the patient's review from today.   Objective:   Physical Exam:  LMP 02/04/2019   Physical Exam Neurological:     Mental Status: She is alert and oriented to person, place, and time.     Cranial Nerves: No dysarthria.  Psychiatric:        Attention and Perception: Attention and perception normal.        Mood and Affect: Mood is anxious and depressed. Affect is tearful.        Speech: Speech normal.        Behavior: Behavior is cooperative.        Thought Content: Thought content is not paranoid or delusional. Thought content includes suicidal ideation. Thought content does not include homicidal ideation. Thought content does not include homicidal or suicidal plan.        Cognition and Memory: Cognition and memory normal.        Judgment: Judgment normal.     Comments: Insight intact Denies suicidal plan or intent. Contracts for safety.      Lab Review:     Component Value Date/Time   NA 138 08/31/2023 1403   K 4.1 08/31/2023 1403   CL 97 08/31/2023 1403   CO2 30 08/31/2023 1403   GLUCOSE 104 (H) 08/31/2023 1403   BUN 8 08/31/2023 1403   CREATININE 0.75 08/31/2023 1403   CALCIUM 9.9 08/31/2023 1403   PROT 7.4 11/24/2023 1121   PROT 7.1 10/21/2022 0914   ALBUMIN 4.6 11/24/2023 1121   ALBUMIN 4.9 10/21/2022 0914   AST 89 (H) 11/24/2023 1121   ALT 115 (H) 11/24/2023 1121  ALT 39 (H) 04/18/2019 0821   ALKPHOS 118 (H) 11/24/2023 1121   BILITOT 0.6 11/24/2023 1121   BILITOT 0.2 10/21/2022 0914   GFRNONAA >60 03/17/2023 1152        Component Value Date/Time   WBC 6.0 08/31/2023 1403   RBC 4.21 08/31/2023 1403   HGB 13.3 08/31/2023 1403   HCT 40.8 08/31/2023 1403   PLT 200.0 08/31/2023 1403   MCV 97.0 08/31/2023 1403   MCH 30.6 03/17/2023 1152   MCHC 32.7 08/31/2023 1403   RDW 15.5 08/31/2023 1403   LYMPHSABS 1.5 08/31/2023 1403   MONOABS 0.4 08/31/2023 1403   EOSABS 0.0 08/31/2023 1403   BASOSABS 0.0 08/31/2023 1403    No results found for: "POCLITH", "LITHIUM"   No results found for: "PHENYTOIN", "PHENOBARB", "VALPROATE", "CBMZ"   .res Assessment: Plan:    38 minutes spent dedicated to the care of this patient on the date of this encounter to include pre-visit review of records, ordering of medication, post visit documentation, and face-to-face time with the patient discussing response to recent medication changes, treatment plan, and plan for continuation of care since provider is leaving the practice. Discussed that care can be transitioned to another provider at [facility].  She reports that she does not want to re-start Rexulti or another atypical antipsychotic due to concerns about tardive dyskinesia.  Will increase Cymbalta to 60 mg daily for depression and anxiety.  Will decrease Sertraline to 100 mg daily for one week, then decrease to 50 mg daily for one week, then stop.  Continue Klonopin 1 mg 1/2-1 tablet three times daily as needed for panic or insomnia.  Recommend continuing psychotherapy.  Patient advised to contact office with any questions, adverse effects, or acute worsening in signs and symptoms.  Reshawn was seen today for depression and anxiety.  Diagnoses and all orders for this visit:  Severe episode of recurrent major depressive disorder, without psychotic features (HCC) -     DULoxetine (CYMBALTA) 60 MG capsule; Take 1 capsule (60 mg total) by mouth daily. -     sertraline (ZOLOFT) 100 MG tablet; Take 1 tablet daily for one week, then 1/2 tablet daily for one week, then  stop  Generalized anxiety disorder -     DULoxetine (CYMBALTA) 60 MG capsule; Take 1 capsule (60 mg total) by mouth daily. -     sertraline (ZOLOFT) 100 MG tablet; Take 1 tablet daily for one week, then 1/2 tablet daily for one week, then stop -     clonazePAM (KLONOPIN) 1 MG tablet; TAKE 1/2 TO 1 (ONE-HALF TO ONE) TABLET BY MOUTH THREE TIMES DAILY AS NEEDED FOR PANIC ATTACK OR INSOMNIA  Primary insomnia -     clonazePAM (KLONOPIN) 1 MG tablet; TAKE 1/2 TO 1 (ONE-HALF TO ONE) TABLET BY MOUTH THREE TIMES DAILY AS NEEDED FOR PANIC ATTACK OR INSOMNIA     Please see After Visit Summary for patient specific instructions.  No future appointments.  No orders of the defined types were placed in this encounter.     -------------------------------

## 2023-12-05 ENCOUNTER — Telehealth: Payer: Self-pay

## 2023-12-05 DIAGNOSIS — M26609 Unspecified temporomandibular joint disorder, unspecified side: Secondary | ICD-10-CM

## 2023-12-05 DIAGNOSIS — E785 Hyperlipidemia, unspecified: Secondary | ICD-10-CM

## 2023-12-05 DIAGNOSIS — K219 Gastro-esophageal reflux disease without esophagitis: Secondary | ICD-10-CM

## 2023-12-05 DIAGNOSIS — E039 Hypothyroidism, unspecified: Secondary | ICD-10-CM

## 2023-12-05 MED ORDER — ATORVASTATIN CALCIUM 40 MG PO TABS: 40.0000 mg | ORAL_TABLET | Freq: Every day | ORAL | 1 refills | Status: DC

## 2023-12-05 MED ORDER — GABAPENTIN 300 MG PO CAPS: 300.0000 mg | ORAL_CAPSULE | Freq: Three times a day (TID) | ORAL | 1 refills | Status: DC

## 2023-12-05 MED ORDER — LEVOTHYROXINE SODIUM 75 MCG PO TABS: 75.0000 ug | ORAL_TABLET | Freq: Every day | ORAL | 1 refills | Status: DC

## 2023-12-05 MED ORDER — PANTOPRAZOLE SODIUM 40 MG PO TBEC: DELAYED_RELEASE_TABLET | ORAL | 1 refills | Status: DC

## 2023-12-05 NOTE — Telephone Encounter (Signed)
Rx's sent for 6 months

## 2023-12-05 NOTE — Telephone Encounter (Signed)
Copied from CRM [id number]. Topic: Clinical - Medication Question >> Dec 05, 2023  1:13 PM Almira Coaster wrote: Reason for CRM: Patient was seen on 08/31/2023 and informed Hetty Blend that she moved to [facility], she was recommended to find a PCP close by which patient did;however, it's not until June. Patient was hoping Vickie can send a refill for all of patient's medication to hold her over until then.

## 2024-02-27 ENCOUNTER — Telehealth: Payer: Self-pay | Admitting: Psychiatry

## 2024-02-27 NOTE — Telephone Encounter (Signed)
 Pt called need apt before 4/17. Hospital discharge. Meds change & will be out. Prev pt of [facility]

## 2024-03-05 ENCOUNTER — Encounter: Payer: Self-pay | Admitting: Adult Health

## 2024-03-05 ENCOUNTER — Telehealth (INDEPENDENT_AMBULATORY_CARE_PROVIDER_SITE_OTHER): Payer: PRIVATE HEALTH INSURANCE | Admitting: Adult Health

## 2024-03-05 DIAGNOSIS — F332 Major depressive disorder, recurrent severe without psychotic features: Secondary | ICD-10-CM

## 2024-03-05 DIAGNOSIS — F5101 Primary insomnia: Secondary | ICD-10-CM | POA: Diagnosis not present

## 2024-03-05 DIAGNOSIS — F411 Generalized anxiety disorder: Secondary | ICD-10-CM

## 2024-03-05 NOTE — Progress Notes (Addendum)
 Amy Mcintyre [id number] 19-Feb-1966 58 y.o.  Virtual Visit via Video Note  I connected with pt @ on 03/05/24 at  2:00 PM EDT by a video enabled telemedicine application and verified that I am speaking with the correct person using two identifiers.   I discussed the limitations of evaluation and management by telemedicine and the availability of in person appointments. The patient expressed understanding and agreed to proceed.  I discussed the assessment and treatment plan with the patient. The patient was provided an opportunity to ask questions and all were answered. The patient agreed with the plan and demonstrated an understanding of the instructions.   The patient was advised to call back or seek an in-person evaluation if the symptoms worsen or if the condition fails to improve as anticipated.  I provided 30 minutes of non-face-to-face time during this encounter.  The patient was located at home.  The provider was located at [facility].   [name], NP   Subjective:   Patient ID:  Amy Mcintyre is a 58 y.o. (DOB [birth year]) female.  Chief Complaint: No chief complaint on file.   HPI Amy Mcintyre presents for follow-up of anxiety, depression, and insomnia.   Reports recent psychiatric hospitalization in [place]  - current collateral unavailable.  Describes mood today as "much better". Reports some tearfulness - "feeling down in the dumps about things". Mood symptoms - reports depression and anxiety. Reports lower interest and motivation. Denies irritability. Reports panic attacks. Reports worry, rumination and over thinking. Reports mood as stable. Feels like current medication regimen is helpful. Taking medications as prescribed.  Energy levels lower - feels fatigued. Active, does not have a regular exercise routine.  Unable to enjoy some usual interests and activities. Lives with mother currently. Currently living with her mother. Has a daughter - age 42.   Appetite adequate. Weight gain. Working with a nutritionist - Metformin  750mg  daily. Sleeps well most nights. Averages 5 hours. Denies daytime napping. Focus and concentration stable. Completing tasks. Managing aspects of household. Retired. Denies SI or HI.  Denies AH or VH. Denies self harm. Denies substance use.   Current therapist - Wendie Hamburg  Past Psychiatric Medication Trials: Paxil Prozac Sertraline  Nortriptyline Wellbutrin - not helpful for depression.  Doxepin  BuSpar  Abilify-increased heart rate Rexulti -Helpful for mood and anxiety Seroquel -adverse effects Gabapentin - takes for jaw pain Lunesta-not effective Ambien- parasomnias Trazodone-ineffective Belsomra-worsening insomnia Klonopin   Review of Systems:  Review of Systems  Musculoskeletal:  Negative for gait problem.  Neurological:  Negative for tremors.  Psychiatric/Behavioral:         Please refer to HPI    Medications: I have reviewed the patient's current medications.  Current Outpatient Medications  Medication Sig Dispense Refill   atorvastatin  (LIPITOR) 40 MG tablet Take 1 tablet (40 mg total) by mouth at bedtime. 90 tablet 1   clonazePAM  (KLONOPIN ) 1 MG tablet TAKE 1/2 TO 1 (ONE-HALF TO ONE) TABLET BY MOUTH THREE TIMES DAILY AS NEEDED FOR PANIC ATTACK OR INSOMNIA 90 tablet 3   DULoxetine  (CYMBALTA ) 60 MG capsule Take 1 capsule (60 mg total) by mouth daily. 90 capsule 1   gabapentin  (NEURONTIN ) 300 MG capsule Take 1 capsule (300 mg total) by mouth 3 (three) times daily. 280 capsule 1   levothyroxine  (SYNTHROID ) 75 MCG tablet Take 1 tablet (75 mcg total) by mouth daily. 90 tablet 1   pantoprazole  (PROTONIX ) 40 MG tablet TAKE 1 TABLET BY MOUTH DAILY 90 tablet 1   sertraline  (ZOLOFT ) 100 MG tablet Take 1 tablet  daily for one week, then 1/2 tablet daily for one week, then stop     No current facility-administered medications for this visit.    Medication Side Effects: None  Allergies:  Allergies   Allergen Reactions   Ambien [Zolpidem Tartrate] Other (See Comments)    Sleep walking   Erythromycin Swelling    Top lip swelled up.   Penicillins Swelling    Childhood reaction    Past Medical History:  Diagnosis Date   Allergy    Anxiety    Cataracts, bilateral    Colitis    Depression    Eosinophilic esophagitis    Fatty liver    Gastritis    GERD (gastroesophageal reflux disease)    Hyperlipidemia    Pre-diabetes    Sleep apnea    Thyroid  disease    TMJ (dislocation of temporomandibular joint)     Family History  Problem Relation Age of Onset   Anxiety disorder Mother    Panic disorder Mother    OCD Mother    Depression Mother    Arthritis Mother    High Cholesterol Mother    Anxiety disorder Father    Depression Father    Colon polyps Father    Heart disease Father    High Cholesterol Father    High blood pressure Father    Drug abuse Brother    Asthma Brother    COPD Brother    Depression Brother    Arthritis Maternal Grandmother    Depression Maternal Grandfather    Depression Paternal Grandmother    Alcohol abuse Paternal Grandfather    Heart disease Paternal Grandfather    Colon cancer Neg Hx    Esophageal cancer Neg Hx    Pancreatic cancer Neg Hx    Stomach cancer Neg Hx    Liver disease Neg Hx    Rectal cancer Neg Hx     Social History   Socioeconomic History   Marital status: Single    Spouse name: Not on file   Number of children: 1   Years of education: Not on file   Highest education level: Master's degree (e.g., MA, MS, MEng, MEd, MSW, MBA)  Occupational History   Occupation: MANAGER  Tobacco Use   Smoking status: Never    Passive exposure: Never   Smokeless tobacco: Never  Vaping Use   Vaping status: Never Used  Substance and Sexual Activity   Alcohol use: Yes    Comment: socially   Drug use: Never   Sexual activity: Not Currently  Other Topics Concern   Not on file  Social History Narrative   Not on file   Social  Drivers of Health   Financial Resource Strain: Low Risk  (08/27/2023)   Overall Financial Resource Strain (CARDIA)    Difficulty of Paying Living Expenses: Not hard at all  Food Insecurity: No Food Insecurity (08/27/2023)   Hunger Vital Sign    Worried About Running Out of Food in the Last Year: Never true    Ran Out of Food in the Last Year: Never true  Transportation Needs: No Transportation Needs (08/27/2023)   PRAPARE - Administrator, Civil Service (Medical): No    Lack of Transportation (Non-Medical): No  Physical Activity: Insufficiently Active (08/27/2023)   Exercise Vital Sign    Days of Exercise per Week: 1 day    Minutes of Exercise per Session: 20 min  Stress: Stress Concern Present (08/27/2023)   [employer] of Occupational Health -  Occupational Stress Questionnaire    Feeling of Stress : Very much  Social Connections: Unknown (08/27/2023)   Social Connection and Isolation Panel [NHANES]    Frequency of Communication with Friends and Family: Twice a week    Frequency of Social Gatherings with Friends and Family: Patient declined    Attends Religious Services: Never    Database administrator or Organizations: No    Attends Engineer, structural: Not on file    Marital Status: Divorced  Catering manager Violence: Not on file    Past Medical History, Surgical history, Social history, and Family history were reviewed and updated as appropriate.   Please see review of systems for further details on the patient's review from today.   Objective:   Physical Exam:  LMP 02/04/2019   Physical Exam Constitutional:      General: She is not in acute distress. Musculoskeletal:        General: No deformity.  Neurological:     Mental Status: She is alert and oriented to person, place, and time.     Coordination: Coordination normal.  Psychiatric:        Attention and Perception: Attention and perception normal. She does not perceive auditory or visual  hallucinations.        Mood and Affect: Mood normal. Mood is not anxious or depressed. Affect is not labile, blunt, angry or inappropriate.        Speech: Speech normal.        Behavior: Behavior normal.        Thought Content: Thought content normal. Thought content is not paranoid or delusional. Thought content does not include homicidal or suicidal ideation. Thought content does not include homicidal or suicidal plan.        Cognition and Memory: Cognition and memory normal.        Judgment: Judgment normal.     Comments: Insight intact    Lab Review:     Component Value Date/Time   NA 138 08/31/2023 1403   K 4.1 08/31/2023 1403   CL 97 08/31/2023 1403   CO2 30 08/31/2023 1403   GLUCOSE 104 (H) 08/31/2023 1403   BUN 8 08/31/2023 1403   CREATININE 0.75 08/31/2023 1403   CALCIUM  9.9 08/31/2023 1403   PROT 7.4 11/24/2023 1121   PROT 7.1 10/21/2022 0914   ALBUMIN 4.6 11/24/2023 1121   ALBUMIN 4.9 10/21/2022 0914   AST 89 (H) 11/24/2023 1121   ALT 115 (H) 11/24/2023 1121   ALT 39 (H) 04/18/2019 0821   ALKPHOS 118 (H) 11/24/2023 1121   BILITOT 0.6 11/24/2023 1121   BILITOT 0.2 10/21/2022 0914   GFRNONAA >60 03/17/2023 1152       Component Value Date/Time   WBC 6.0 08/31/2023 1403   RBC 4.21 08/31/2023 1403   HGB 13.3 08/31/2023 1403   HCT 40.8 08/31/2023 1403   PLT 200.0 08/31/2023 1403   MCV 97.0 08/31/2023 1403   MCH 30.6 03/17/2023 1152   MCHC 32.7 08/31/2023 1403   RDW 15.5 08/31/2023 1403   LYMPHSABS 1.5 08/31/2023 1403   MONOABS 0.4 08/31/2023 1403   EOSABS 0.0 08/31/2023 1403   BASOSABS 0.0 08/31/2023 1403    No results found for: "POCLITH", "LITHIUM"   No results found for: "PHENYTOIN", "PHENOBARB", "VALPROATE", "CBMZ"   .res Assessment: Plan:    Plan:  Medication list from discharged hospital d/c.  Gabapentin  300mg  TID Cymbalta  40mg  daily Hydroxyzine  50mg  every 6 hours as needed for anxiety Seroquel  100mg   at hs  Lamictal  25mg  BID Clonazepam   0.5mg  BID prn  Recommend continuing psychotherapy.   RTC 2 weeks   30 minutes spent dedicated to the care of this patient on the date of this encounter to include pre-visit review of records, ordering of medication, post visit documentation, and face-to-face time with the patient discussing depression, anxiety and insomnia. Discussed continuing current medication regimen.  Discussed potential benefits, risk, and side effects of benzodiazepines to include potential risk of tolerance and dependence, as well as possible drowsiness.  Advised patient not to drive if experiencing drowsiness and to take lowest possible effective dose to minimize risk of dependence and tolerance.   Patient advised to contact office with any questions, adverse effects, or acute worsening in signs and symptoms.  Diagnoses and all orders for this visit:  Severe episode of recurrent major depressive disorder, without psychotic features (HCC)  Generalized anxiety disorder  Primary insomnia     Please see After Visit Summary for patient specific instructions.  No future appointments.   No orders of the defined types were placed in this encounter.     -------------------------------

## 2024-03-06 ENCOUNTER — Other Ambulatory Visit: Payer: Self-pay

## 2024-03-06 DIAGNOSIS — M26609 Unspecified temporomandibular joint disorder, unspecified side: Secondary | ICD-10-CM

## 2024-03-06 MED ORDER — DULOXETINE HCL 20 MG PO CPEP: 40.0000 mg | ORAL_CAPSULE | Freq: Every day | ORAL | 0 refills | Status: DC

## 2024-03-06 MED ORDER — HYDROXYZINE HCL 50 MG PO TABS: 50.0000 mg | ORAL_TABLET | Freq: Four times a day (QID) | ORAL | 0 refills | Status: DC | PRN

## 2024-03-06 MED ORDER — CLONAZEPAM 0.5 MG PO TABS: 0.5000 mg | ORAL_TABLET | Freq: Two times a day (BID) | ORAL | 0 refills | Status: DC | PRN

## 2024-03-06 MED ORDER — LAMOTRIGINE 25 MG PO TABS: 25.0000 mg | ORAL_TABLET | Freq: Two times a day (BID) | ORAL | 0 refills | Status: DC

## 2024-03-06 MED ORDER — QUETIAPINE FUMARATE 100 MG PO TABS: 100.0000 mg | ORAL_TABLET | Freq: Every day | ORAL | 0 refills | Status: DC

## 2024-03-06 MED ORDER — GABAPENTIN 300 MG PO CAPS: 300.0000 mg | ORAL_CAPSULE | Freq: Three times a day (TID) | ORAL | 0 refills | Status: DC

## 2024-03-06 NOTE — Telephone Encounter (Signed)
 Canceled RF sent to [facility] in error and resent to [facility].

## 2024-03-28 ENCOUNTER — Encounter: Payer: Self-pay | Admitting: Adult Health

## 2024-03-28 ENCOUNTER — Telehealth (INDEPENDENT_AMBULATORY_CARE_PROVIDER_SITE_OTHER): Payer: PRIVATE HEALTH INSURANCE | Admitting: Adult Health

## 2024-03-28 DIAGNOSIS — M26609 Unspecified temporomandibular joint disorder, unspecified side: Secondary | ICD-10-CM

## 2024-03-28 DIAGNOSIS — G4700 Insomnia, unspecified: Secondary | ICD-10-CM

## 2024-03-28 DIAGNOSIS — F32A Depression, unspecified: Secondary | ICD-10-CM | POA: Diagnosis not present

## 2024-03-28 DIAGNOSIS — F5101 Primary insomnia: Secondary | ICD-10-CM

## 2024-03-28 DIAGNOSIS — F419 Anxiety disorder, unspecified: Secondary | ICD-10-CM

## 2024-03-28 DIAGNOSIS — F411 Generalized anxiety disorder: Secondary | ICD-10-CM

## 2024-03-28 DIAGNOSIS — F332 Major depressive disorder, recurrent severe without psychotic features: Secondary | ICD-10-CM

## 2024-03-28 MED ORDER — QUETIAPINE FUMARATE 100 MG PO TABS: 100.0000 mg | ORAL_TABLET | Freq: Every day | ORAL | 1 refills | Status: DC

## 2024-03-28 MED ORDER — DULOXETINE HCL 20 MG PO CPEP: 40.0000 mg | ORAL_CAPSULE | Freq: Every day | ORAL | 1 refills | Status: DC

## 2024-03-28 MED ORDER — LAMOTRIGINE 25 MG PO TABS
25.0000 mg | ORAL_TABLET | Freq: Two times a day (BID) | ORAL | 1 refills | Status: DC
Start: 2024-03-28 — End: 2024-08-15

## 2024-03-28 MED ORDER — GABAPENTIN 300 MG PO CAPS: 300.0000 mg | ORAL_CAPSULE | Freq: Three times a day (TID) | ORAL | 1 refills | Status: AC

## 2024-03-28 MED ORDER — CLONAZEPAM 0.5 MG PO TABS: 0.5000 mg | ORAL_TABLET | Freq: Two times a day (BID) | ORAL | 1 refills | Status: DC | PRN

## 2024-03-28 NOTE — Progress Notes (Addendum)
 Amy Mcintyre [id number] [birth year] 58 y.o.  Virtual Visit via Video Note  I connected with pt @ on 03/28/24 at  4:00 PM EDT by a video enabled telemedicine application and verified that I am speaking with the correct person using two identifiers.   I discussed the limitations of evaluation and management by telemedicine and the availability of in person appointments. The patient expressed understanding and agreed to proceed.  I discussed the assessment and treatment plan with the patient. The patient was provided an opportunity to ask questions and all were answered. The patient agreed with the plan and demonstrated an understanding of the instructions.   The patient was advised to call back or seek an in-person evaluation if the symptoms worsen or if the condition fails to improve as anticipated.  I provided 30 minutes of non-face-to-face time during this encounter.  The patient was located at home.  The provider was located at [facility].   [name], NP   Subjective:   Patient ID:  Amy Mcintyre is a 58 y.o. (DOB 02-15-1966) female.  Chief Complaint: No chief complaint on file.   HPI Jacquelene Wuethrich presents for follow-up of anxiety, depression and insomnia.   Reports recent psychiatric hospitalization in [place]  - current collateral unavailable.  Describes mood today as "ok". Reports some tearfulness - "occasionally". Mood symptoms - reports anxiety about what the future holds. Denies depression. Reports improved interest and motivation. Reports irritability - "gets frustrated easily". Reports panic attacks. Reports some worry, rumination and over thinking. Reports mood has improved overall. Feels like current medication regimen is helpful. Taking medications as prescribed.  Energy levels lower. Active, does not have a regular exercise routine.  Unable to enjoy some usual interests and activities. Lives with ex-husband. Has a daughter - age 30.  Appetite adequate.  Weight gain - 162 pounds. Working with a nutritionist - Metformin  750mg  daily. Sleeps well most nights. Averages 5 hours of broken sleep. Denies daytime napping. Focus and concentration stable. Completing tasks. Managing aspects of household. Retired. Denies SI or HI.  Denies AH or VH. Denies self harm. Denies substance use.   Current therapist - Wendie Hamburg  Past Psychiatric Medication Trials: Paxil Prozac Sertraline  Nortriptyline Wellbutrin - not helpful for depression.  Doxepin  BuSpar  Abilify-increased heart rate Rexulti -Helpful for mood and anxiety Seroquel -adverse effects Gabapentin - takes for jaw pain Lunesta-not effective Ambien- parasomnias Trazodone-ineffective Belsomra-worsening insomnia Klonopin    Review of Systems:  Review of Systems  Musculoskeletal:  Negative for gait problem.  Neurological:  Negative for tremors.  Psychiatric/Behavioral:         Please refer to HPI    Medications: I have reviewed the patient's current medications.  Current Outpatient Medications  Medication Sig Dispense Refill   atorvastatin  (LIPITOR) 40 MG tablet Take 1 tablet (40 mg total) by mouth at bedtime. 90 tablet 1   clonazePAM  (KLONOPIN ) 0.5 MG tablet Take 1 tablet (0.5 mg total) by mouth 2 (two) times daily as needed for anxiety. 60 tablet 0   DULoxetine  (CYMBALTA ) 20 MG capsule Take 2 capsules (40 mg total) by mouth daily. 60 capsule 0   gabapentin  (NEURONTIN ) 300 MG capsule Take 1 capsule (300 mg total) by mouth 3 (three) times daily. 90 capsule 0   hydrOXYzine  (ATARAX ) 50 MG tablet Take 1 tablet (50 mg total) by mouth every 6 (six) hours as needed. 120 tablet 0   lamoTRIgine  (LAMICTAL ) 25 MG tablet Take 1 tablet (25 mg total) by mouth 2 (two) times daily. 60 tablet 0  levothyroxine  (SYNTHROID ) 75 MCG tablet Take 1 tablet (75 mcg total) by mouth daily. 90 tablet 1   pantoprazole  (PROTONIX ) 40 MG tablet TAKE 1 TABLET BY MOUTH DAILY 90 tablet 1   QUEtiapine  (SEROQUEL ) 100 MG  tablet Take 1 tablet (100 mg total) by mouth at bedtime. 30 tablet 0   sertraline  (ZOLOFT ) 100 MG tablet Take 1 tablet daily for one week, then 1/2 tablet daily for one week, then stop     No current facility-administered medications for this visit.    Medication Side Effects: None  Allergies:  Allergies  Allergen Reactions   Ambien [Zolpidem Tartrate] Other (See Comments)    Sleep walking   Erythromycin Swelling    Top lip swelled up.   Penicillins Swelling    Childhood reaction    Past Medical History:  Diagnosis Date   Allergy    Anxiety    Cataracts, bilateral    Colitis    Depression    Eosinophilic esophagitis    Fatty liver    Gastritis    GERD (gastroesophageal reflux disease)    Hyperlipidemia    Pre-diabetes    Sleep apnea    Thyroid  disease    TMJ (dislocation of temporomandibular joint)     Family History  Problem Relation Age of Onset   Anxiety disorder Mother    Panic disorder Mother    OCD Mother    Depression Mother    Arthritis Mother    High Cholesterol Mother    Anxiety disorder Father    Depression Father    Colon polyps Father    Heart disease Father    High Cholesterol Father    High blood pressure Father    Drug abuse Brother    Asthma Brother    COPD Brother    Depression Brother    Arthritis Maternal Grandmother    Depression Maternal Grandfather    Depression Paternal Grandmother    Alcohol abuse Paternal Grandfather    Heart disease Paternal Grandfather    Colon cancer Neg Hx    Esophageal cancer Neg Hx    Pancreatic cancer Neg Hx    Stomach cancer Neg Hx    Liver disease Neg Hx    Rectal cancer Neg Hx     Social History   Socioeconomic History   Marital status: Single    Spouse name: Not on file   Number of children: 1   Years of education: Not on file   Highest education level: Master's degree (e.g., MA, MS, MEng, MEd, MSW, MBA)  Occupational History   Occupation: MANAGER  Tobacco Use   Smoking status: Never     Passive exposure: Never   Smokeless tobacco: Never  Vaping Use   Vaping status: Never Used  Substance and Sexual Activity   Alcohol use: Yes    Comment: socially   Drug use: Never   Sexual activity: Not Currently  Other Topics Concern   Not on file  Social History Narrative   Not on file   Social Drivers of Health   Financial Resource Strain: Low Risk  (08/27/2023)   Overall Financial Resource Strain (CARDIA)    Difficulty of Paying Living Expenses: Not hard at all  Food Insecurity: No Food Insecurity (08/27/2023)   Hunger Vital Sign    Worried About Running Out of Food in the Last Year: Never true    Ran Out of Food in the Last Year: Never true  Transportation Needs: No Transportation Needs (08/27/2023)  PRAPARE - Administrator, Civil Service (Medical): No    Lack of Transportation (Non-Medical): No  Physical Activity: Insufficiently Active (08/27/2023)   Exercise Vital Sign    Days of Exercise per Week: 1 day    Minutes of Exercise per Session: 20 min  Stress: Stress Concern Present (08/27/2023)   [employer] of Occupational Health - Occupational Stress Questionnaire    Feeling of Stress : Very much  Social Connections: Unknown (08/27/2023)   Social Connection and Isolation Panel [NHANES]    Frequency of Communication with Friends and Family: Twice a week    Frequency of Social Gatherings with Friends and Family: Patient declined    Attends Religious Services: Never    Database administrator or Organizations: No    Attends Engineer, structural: Not on file    Marital Status: Divorced  Catering manager Violence: Not on file    Past Medical History, Surgical history, Social history, and Family history were reviewed and updated as appropriate.   Please see review of systems for further details on the patient's review from today.   Objective:   Physical Exam:  LMP 02/04/2019   Physical Exam Constitutional:      General: She is not in  acute distress. Musculoskeletal:        General: No deformity.  Neurological:     Mental Status: She is alert and oriented to person, place, and time.     Coordination: Coordination normal.  Psychiatric:        Attention and Perception: Attention and perception normal. She does not perceive auditory or visual hallucinations.        Mood and Affect: Mood normal. Affect is not labile, blunt, angry or inappropriate.        Speech: Speech normal.        Behavior: Behavior normal.        Thought Content: Thought content normal. Thought content is not paranoid or delusional. Thought content does not include homicidal or suicidal ideation. Thought content does not include homicidal or suicidal plan.        Cognition and Memory: Cognition and memory normal.        Judgment: Judgment normal.     Comments: Insight intact     Lab Review:     Component Value Date/Time   NA 138 08/31/2023 1403   K 4.1 08/31/2023 1403   CL 97 08/31/2023 1403   CO2 30 08/31/2023 1403   GLUCOSE 104 (H) 08/31/2023 1403   BUN 8 08/31/2023 1403   CREATININE 0.75 08/31/2023 1403   CALCIUM  9.9 08/31/2023 1403   PROT 7.4 11/24/2023 1121   PROT 7.1 10/21/2022 0914   ALBUMIN 4.6 11/24/2023 1121   ALBUMIN 4.9 10/21/2022 0914   AST 89 (H) 11/24/2023 1121   ALT 115 (H) 11/24/2023 1121   ALT 39 (H) 04/18/2019 0821   ALKPHOS 118 (H) 11/24/2023 1121   BILITOT 0.6 11/24/2023 1121   BILITOT 0.2 10/21/2022 0914   GFRNONAA >60 03/17/2023 1152       Component Value Date/Time   WBC 6.0 08/31/2023 1403   RBC 4.21 08/31/2023 1403   HGB 13.3 08/31/2023 1403   HCT 40.8 08/31/2023 1403   PLT 200.0 08/31/2023 1403   MCV 97.0 08/31/2023 1403   MCH 30.6 03/17/2023 1152   MCHC 32.7 08/31/2023 1403   RDW 15.5 08/31/2023 1403   LYMPHSABS 1.5 08/31/2023 1403   MONOABS 0.4 08/31/2023 1403   EOSABS 0.0 08/31/2023 1403  BASOSABS 0.0 08/31/2023 1403    No results found for: "POCLITH", "LITHIUM"   No results found for:  "PHENYTOIN", "PHENOBARB", "VALPROATE", "CBMZ"   .res Assessment: Plan:    Plan:  Medication list from discharged hospital d/c.  Gabapentin  300mg  TID Cymbalta  40mg  daily Seroquel  100mg  at hs  Lamictal  25mg  BID  Clonazepam  0.5mg  BID prn Hydroxyzine  50mg  every 6 hours as needed for anxiety  Recommend continuing psychotherapy.   RTC 4 weeks   30 minutes spent dedicated to the care of this patient on the date of this encounter to include pre-visit review of records, ordering of medication, post visit documentation, and face-to-face time with the patient discussing depression, anxiety and insomnia. Discussed continuing current medication regimen.  Discussed potential benefits, risk, and side effects of benzodiazepines to include potential risk of tolerance and dependence, as well as possible drowsiness.  Advised patient not to drive if experiencing drowsiness and to take lowest possible effective dose to minimize risk of dependence and tolerance.   Patient advised to contact office with any questions, adverse effects, or acute worsening in signs and symptoms.  There are no diagnoses linked to this encounter.   Please see After Visit Summary for patient specific instructions.  Future Appointments  Date Time Provider Department Center  03/28/2024  4:00 PM Kashaun Bebo Nattalie, NP [facility] None    No orders of the defined types were placed in this encounter.     -------------------------------

## 2024-04-05 ENCOUNTER — Telehealth: Payer: Self-pay | Admitting: Adult Health

## 2024-04-05 NOTE — Telephone Encounter (Signed)
 Pt lvm that she is experiencing severe side effects to a medication. Please call her at [phone]

## 2024-04-05 NOTE — Telephone Encounter (Signed)
 Pt advised to go to the ED for evaluation for possible clonazepam  withdrawal. She said she probably would not do that d/t recent MH hospitalization.  Based on dates she should be able to RF her clonazepam  today and will restart that first, most likely.

## 2024-04-05 NOTE — Telephone Encounter (Signed)
 Pt reporting she had an auditory hallucination last night where she heard music. It lasted about an hour and she another one this morning that lasted about 30 minutes. She reports her clonazepam  was decreased in the hospital from 1 mg TID to 0.5 mg BID. She said she was used to taking TID and took more than prescribed and ran out, has been out for several days. She said she last filled 4/2 and has RF at the pharmacy. She said she had been more irritable and she was irritable with me, told me I wasn't listening to her.  Speech was rapid and rambling and it was difficult to ask questions. She reports she has night sweats to the point where she needs to get up and change clothes.  You did not change any of her medications from hospital discharge.

## 2024-04-10 ENCOUNTER — Telehealth: Payer: Self-pay | Admitting: Adult Health

## 2024-04-10 NOTE — Telephone Encounter (Signed)
 You don't have anything in office. She has one 5/22? Should we move it up

## 2024-04-10 NOTE — Telephone Encounter (Signed)
 Received a fax from [place]  [employer] . Requesting 1 Year of records and a letter of professional opinion to be sent re: patient. She is a LPC. Notified Gina Mozingo and patient and placed in Gina's box . Pt does request a call before hand.

## 2024-04-26 ENCOUNTER — Encounter: Payer: Self-pay | Admitting: Adult Health

## 2024-04-26 ENCOUNTER — Telehealth (INDEPENDENT_AMBULATORY_CARE_PROVIDER_SITE_OTHER): Payer: Self-pay | Admitting: Adult Health

## 2024-04-26 DIAGNOSIS — F411 Generalized anxiety disorder: Secondary | ICD-10-CM | POA: Diagnosis not present

## 2024-04-26 DIAGNOSIS — F5101 Primary insomnia: Secondary | ICD-10-CM

## 2024-04-26 DIAGNOSIS — F332 Major depressive disorder, recurrent severe without psychotic features: Secondary | ICD-10-CM

## 2024-04-26 NOTE — Progress Notes (Signed)
 Amy Mcintyre [id number] 1966-01-20 58 y.o.  Virtual Visit via Video Note  I connected with pt @ on 04/26/24 at  1:30 PM EDT by a video enabled telemedicine application and verified that I am speaking with the correct person using two identifiers.   I discussed the limitations of evaluation and management by telemedicine and the availability of in person appointments. The patient expressed understanding and agreed to proceed.  I discussed the assessment and treatment plan with the patient. The patient was provided an opportunity to ask questions and all were answered. The patient agreed with the plan and demonstrated an understanding of the instructions.   The patient was advised to call back or seek an in-person evaluation if the symptoms worsen or if the condition fails to improve as anticipated.  I provided 30 minutes of non-face-to-face time during this encounter.  The patient was located at home.  The provider was located at [facility].   [name], NP   Subjective:   Patient ID:  Amy Mcintyre is a 58 y.o. (DOB 11/25/1966) female.  Chief Complaint: No chief complaint on file.   HPI Amy Mcintyre presents for follow-up of anxiety, depression and insomnia.   Describes mood today as "ok". Pleasant. Denies tearfulness.  Mood symptoms - denies depression, anxiety and irritability.  Reports stable interest and motivation. Denies panic attacks. Reports some worry, rumination and over thinking. Denies obsessive thoughts or acts. Reports mood as stable. Feels like current medication regimen works well. Taking medications as prescribed.  Energy levels stable. Active, does not have a regular exercise routine - walking some days.  Unable to enjoy some usual interests and activities. Lives with ex-husband. Has a daughter - age 74.  Appetite adequate. Weight gain - 162 to 165 pounds. Working with a nutritionist - Metformin  750mg  daily. Sleeps well most nights. Averages 6 to 8  hours. Denies daytime napping. Focus and concentration stable. Completing tasks. Managing aspects of household. Retired. Denies SI or HI.  Denies AH or VH. Denies self harm. Denies substance use.   Current therapist - Wendie Hamburg  Past Psychiatric Medication Trials: Paxil Prozac Sertraline  Nortriptyline Wellbutrin - not helpful for depression.  Doxepin  BuSpar  Abilify-increased heart rate Rexulti -Helpful for mood and anxiety Seroquel -adverse effects Gabapentin - takes for jaw pain Lunesta-not effective Ambien- parasomnias Trazodone-ineffective Belsomra-worsening insomnia Klonopin    Review of Systems:  Review of Systems  Musculoskeletal:  Negative for gait problem.  Neurological:  Negative for tremors.  Psychiatric/Behavioral:         Please refer to HPI    Medications: I have reviewed the patient's current medications.  Current Outpatient Medications  Medication Sig Dispense Refill   atorvastatin  (LIPITOR) 40 MG tablet Take 1 tablet (40 mg total) by mouth at bedtime. 90 tablet 1   clonazePAM  (KLONOPIN ) 0.5 MG tablet Take 1 tablet (0.5 mg total) by mouth 2 (two) times daily as needed for anxiety. 60 tablet 1   DULoxetine  (CYMBALTA ) 20 MG capsule Take 2 capsules (40 mg total) by mouth daily. 60 capsule 1   gabapentin  (NEURONTIN ) 300 MG capsule Take 1 capsule (300 mg total) by mouth 3 (three) times daily. 90 capsule 1   hydrOXYzine  (ATARAX ) 50 MG tablet Take 1 tablet (50 mg total) by mouth every 6 (six) hours as needed. 120 tablet 0   lamoTRIgine  (LAMICTAL ) 25 MG tablet Take 1 tablet (25 mg total) by mouth 2 (two) times daily. 60 tablet 1   levothyroxine  (SYNTHROID ) 75 MCG tablet Take 1 tablet (75 mcg total)  by mouth daily. 90 tablet 1   pantoprazole  (PROTONIX ) 40 MG tablet TAKE 1 TABLET BY MOUTH DAILY 90 tablet 1   QUEtiapine  (SEROQUEL ) 100 MG tablet Take 1 tablet (100 mg total) by mouth at bedtime. 30 tablet 1   No current facility-administered medications for this  visit.    Medication Side Effects: None  Allergies:  Allergies  Allergen Reactions   Ambien [Zolpidem Tartrate] Other (See Comments)    Sleep walking   Erythromycin Swelling    Top lip swelled up.   Penicillins Swelling    Childhood reaction    Past Medical History:  Diagnosis Date   Allergy    Anxiety    Cataracts, bilateral    Colitis    Depression    Eosinophilic esophagitis    Fatty liver    Gastritis    GERD (gastroesophageal reflux disease)    Hyperlipidemia    Pre-diabetes    Sleep apnea    Thyroid  disease    TMJ (dislocation of temporomandibular joint)     Family History  Problem Relation Age of Onset   Anxiety disorder Mother    Panic disorder Mother    OCD Mother    Depression Mother    Arthritis Mother    High Cholesterol Mother    Anxiety disorder Father    Depression Father    Colon polyps Father    Heart disease Father    High Cholesterol Father    High blood pressure Father    Drug abuse Brother    Asthma Brother    COPD Brother    Depression Brother    Arthritis Maternal Grandmother    Depression Maternal Grandfather    Depression Paternal Grandmother    Alcohol abuse Paternal Grandfather    Heart disease Paternal Grandfather    Colon cancer Neg Hx    Esophageal cancer Neg Hx    Pancreatic cancer Neg Hx    Stomach cancer Neg Hx    Liver disease Neg Hx    Rectal cancer Neg Hx     Social History   Socioeconomic History   Marital status: Single    Spouse name: Not on file   Number of children: 1   Years of education: Not on file   Highest education level: Master's degree (e.g., MA, MS, MEng, MEd, MSW, MBA)  Occupational History   Occupation: MANAGER  Tobacco Use   Smoking status: Never    Passive exposure: Never   Smokeless tobacco: Never  Vaping Use   Vaping status: Never Used  Substance and Sexual Activity   Alcohol use: Yes    Comment: socially   Drug use: Never   Sexual activity: Not Currently  Other Topics Concern    Not on file  Social History Narrative   Not on file   Social Drivers of Health   Financial Resource Strain: Low Risk  (08/27/2023)   Overall Financial Resource Strain (CARDIA)    Difficulty of Paying Living Expenses: Not hard at all  Food Insecurity: No Food Insecurity (08/27/2023)   Hunger Vital Sign    Worried About Running Out of Food in the Last Year: Never true    Ran Out of Food in the Last Year: Never true  Transportation Needs: No Transportation Needs (08/27/2023)   PRAPARE - Administrator, Civil Service (Medical): No    Lack of Transportation (Non-Medical): No  Physical Activity: Insufficiently Active (08/27/2023)   Exercise Vital Sign    Days of Exercise  per Week: 1 day    Minutes of Exercise per Session: 20 min  Stress: Stress Concern Present (08/27/2023)   [employer] of Occupational Health - Occupational Stress Questionnaire    Feeling of Stress : Very much  Social Connections: Unknown (08/27/2023)   Social Connection and Isolation Panel [NHANES]    Frequency of Communication with Friends and Family: Twice a week    Frequency of Social Gatherings with Friends and Family: Patient declined    Attends Religious Services: Never    Database administrator or Organizations: No    Attends Engineer, structural: Not on file    Marital Status: Divorced  Catering manager Violence: Not on file    Past Medical History, Surgical history, Social history, and Family history were reviewed and updated as appropriate.   Please see review of systems for further details on the patient's review from today.   Objective:   Physical Exam:  LMP 02/04/2019   Physical Exam Constitutional:      General: She is not in acute distress. Musculoskeletal:        General: No deformity.  Neurological:     Mental Status: She is alert and oriented to person, place, and time.     Coordination: Coordination normal.  Psychiatric:        Attention and Perception:  Attention and perception normal. She does not perceive auditory or visual hallucinations.        Mood and Affect: Mood normal. Mood is not anxious or depressed. Affect is not labile, blunt, angry or inappropriate.        Speech: Speech normal.        Behavior: Behavior normal.        Thought Content: Thought content normal. Thought content is not paranoid or delusional. Thought content does not include homicidal or suicidal ideation. Thought content does not include homicidal or suicidal plan.        Cognition and Memory: Cognition and memory normal.        Judgment: Judgment normal.     Comments: Insight intact     Lab Review:     Component Value Date/Time   NA 138 08/31/2023 1403   K 4.1 08/31/2023 1403   CL 97 08/31/2023 1403   CO2 30 08/31/2023 1403   GLUCOSE 104 (H) 08/31/2023 1403   BUN 8 08/31/2023 1403   CREATININE 0.75 08/31/2023 1403   CALCIUM  9.9 08/31/2023 1403   PROT 7.4 11/24/2023 1121   PROT 7.1 10/21/2022 0914   ALBUMIN 4.6 11/24/2023 1121   ALBUMIN 4.9 10/21/2022 0914   AST 89 (H) 11/24/2023 1121   ALT 115 (H) 11/24/2023 1121   ALT 39 (H) 04/18/2019 0821   ALKPHOS 118 (H) 11/24/2023 1121   BILITOT 0.6 11/24/2023 1121   BILITOT 0.2 10/21/2022 0914   GFRNONAA >60 03/17/2023 1152       Component Value Date/Time   WBC 6.0 08/31/2023 1403   RBC 4.21 08/31/2023 1403   HGB 13.3 08/31/2023 1403   HCT 40.8 08/31/2023 1403   PLT 200.0 08/31/2023 1403   MCV 97.0 08/31/2023 1403   MCH 30.6 03/17/2023 1152   MCHC 32.7 08/31/2023 1403   RDW 15.5 08/31/2023 1403   LYMPHSABS 1.5 08/31/2023 1403   MONOABS 0.4 08/31/2023 1403   EOSABS 0.0 08/31/2023 1403   BASOSABS 0.0 08/31/2023 1403    No results found for: "POCLITH", "LITHIUM"   No results found for: "PHENYTOIN", "PHENOBARB", "VALPROATE", "CBMZ"   .res Assessment: Plan:  Plan:  Gabapentin  300mg  TID Cymbalta  40mg  daily  Clonazepam  0.5mg  BID prn Lamictal  25mg  BID  Reduce Seroquel  100mg  to 50mg  at  hs   D/C Hydroxyzine  50mg  every 6 hours as needed for anxiety - taking infrequently.  Recommend continuing psychotherapy.   RTC 4 weeks   30 minutes spent dedicated to the care of this patient on the date of this encounter to include pre-visit review of records, ordering of medication, post visit documentation, and face-to-face time with the patient discussing depression, anxiety and insomnia. Discussed continuing current medication regimen.  Discussed potential benefits, risk, and side effects of benzodiazepines to include potential risk of tolerance and dependence, as well as possible drowsiness.  Advised patient not to drive if experiencing drowsiness and to take lowest possible effective dose to minimize risk of dependence and tolerance.   Patient advised to contact office with any questions, adverse effects, or acute worsening in signs and symptoms.  Diagnoses and all orders for this visit:  Severe episode of recurrent major depressive disorder, without psychotic features (HCC)  Generalized anxiety disorder  Primary insomnia     Please see After Visit Summary for patient specific instructions.  No future appointments.   No orders of the defined types were placed in this encounter.     -------------------------------

## 2024-05-01 ENCOUNTER — Other Ambulatory Visit: Payer: Self-pay | Admitting: Family Medicine

## 2024-05-01 DIAGNOSIS — K219 Gastro-esophageal reflux disease without esophagitis: Secondary | ICD-10-CM

## 2024-05-01 DIAGNOSIS — E785 Hyperlipidemia, unspecified: Secondary | ICD-10-CM

## 2024-05-28 ENCOUNTER — Encounter: Payer: Self-pay | Admitting: Adult Health

## 2024-05-28 ENCOUNTER — Telehealth (INDEPENDENT_AMBULATORY_CARE_PROVIDER_SITE_OTHER): Payer: PRIVATE HEALTH INSURANCE | Admitting: Adult Health

## 2024-05-28 DIAGNOSIS — F411 Generalized anxiety disorder: Secondary | ICD-10-CM | POA: Diagnosis not present

## 2024-05-28 DIAGNOSIS — F332 Major depressive disorder, recurrent severe without psychotic features: Secondary | ICD-10-CM

## 2024-05-28 DIAGNOSIS — F5101 Primary insomnia: Secondary | ICD-10-CM | POA: Diagnosis not present

## 2024-05-28 NOTE — Progress Notes (Signed)
 Amy Mcintyre [id number] 1966-08-18 58 y.o.  Virtual Visit via Video Note  I connected with pt @ on 05/28/24 at  4:30 PM EDT by a video enabled telemedicine application and verified that I am speaking with the correct person using two identifiers.   I discussed the limitations of evaluation and management by telemedicine and the availability of in person appointments. The patient expressed understanding and agreed to proceed.  I discussed the assessment and treatment plan with the patient. The patient was provided an opportunity to ask questions and all were answered. The patient agreed with the plan and demonstrated an understanding of the instructions.   The patient was advised to call back or seek an in-person evaluation if the symptoms worsen or if the condition fails to improve as anticipated.  I provided 25 minutes of non-face-to-face time during this encounter.  The patient was located at home.  The provider was located at [facility].   Amy LOISE Sayers, NP   Subjective:   Patient ID:  Amy Mcintyre is a 58 y.o. (DOB 1966/04/24) female.  Chief Complaint: No chief complaint on file.   HPI Amy Mcintyre presents for follow-up of anxiety, depression and insomnia.   Describes mood today as ok. Pleasant. Reports occasional tearfulness. Mood symptoms - denies depression. Reports increased anxiety - worried about something being wrong with me. Denies irritability. Reports varying interest and motivation. Denies panic attacks. Reports some worry - loss of some friends. Reports some concerns about her physical health. Denies rumination and over thinking. Denies obsessive thoughts or acts. Reports mood as stable. Stating I'm a lot better than I was, but not where I want to be. Reports she is concerned about the side effects of the medications she is taking. Taking medications as prescribed.  Energy levels lower - it's bad. Active, does not have a regular exercise routine -  walking some days. Plans to join a gym. Reports enjoying some usual interests and activities. Lives with ex-husband. Has a daughter - age 60.  Appetite adequate. Weight gain - 165 pounds. Working with a nutritionist - Metformin  750mg  daily. Sleeps well most nights. Averages 5 to 6 hours. Reports daytime napping half the week. Reports focus and concentration difficulties - something new. Reports having word finding issues. Completing tasks. Managing aspects of household. Retired. Denies SI or HI.  Denies AH or VH. Denies self harm. Denies substance use.   Current therapist - Amy Mcintyre  Past Psychiatric Medication Trials: Paxil Prozac Sertraline  Nortriptyline Wellbutrin - not helpful for depression.  Doxepin  BuSpar  Abilify-increased heart rate Rexulti -Helpful for mood and anxiety Seroquel -adverse effects Gabapentin - takes for jaw pain Lunesta-not effective Ambien- parasomnias Trazodone-ineffective Belsomra-worsening insomnia Klonopin     Review of Systems:  Review of Systems  Musculoskeletal:  Negative for gait problem.  Neurological:  Negative for tremors.  Psychiatric/Behavioral:         Please refer to HPI    Medications: I have reviewed the patient's current medications.  Current Outpatient Medications  Medication Sig Dispense Refill   atorvastatin  (LIPITOR) 40 MG tablet Take 1 tablet (40 mg total) by mouth at bedtime. Needs appointment for further refills. 30 tablet 0   clonazePAM  (KLONOPIN ) 0.5 MG tablet Take 1 tablet (0.5 mg total) by mouth 2 (two) times daily as needed for anxiety. 60 tablet 1   DULoxetine  (CYMBALTA ) 20 MG capsule Take 2 capsules (40 mg total) by mouth daily. 60 capsule 1   gabapentin  (NEURONTIN ) 300 MG capsule Take 1 capsule (300 mg total) by mouth  3 (three) times daily. 90 capsule 1   hydrOXYzine  (ATARAX ) 50 MG tablet Take 1 tablet (50 mg total) by mouth every 6 (six) hours as needed. 120 tablet 0   lamoTRIgine  (LAMICTAL ) 25 MG tablet  Take 1 tablet (25 mg total) by mouth 2 (two) times daily. 60 tablet 1   levothyroxine  (SYNTHROID ) 75 MCG tablet Take 1 tablet (75 mcg total) by mouth daily. 90 tablet 1   pantoprazole  (PROTONIX ) 40 MG tablet Take 1 tablet (40 mg total) by mouth daily. Needs appointment for further refills. 30 tablet 0   QUEtiapine  (SEROQUEL ) 100 MG tablet Take 1 tablet (100 mg total) by mouth at bedtime. 30 tablet 1   No current facility-administered medications for this visit.    Medication Side Effects: None  Allergies:  Allergies  Allergen Reactions   Ambien [Zolpidem Tartrate] Other (See Comments)    Sleep walking   Erythromycin Swelling    Top lip swelled up.   Penicillins Swelling    Childhood reaction    Past Medical History:  Diagnosis Date   Allergy    Anxiety    Cataracts, bilateral    Colitis    Depression    Eosinophilic esophagitis    Fatty liver    Gastritis    GERD (gastroesophageal reflux disease)    Hyperlipidemia    Pre-diabetes    Sleep apnea    Thyroid  disease    TMJ (dislocation of temporomandibular joint)     Family History  Problem Relation Age of Onset   Anxiety disorder Mother    Panic disorder Mother    OCD Mother    Depression Mother    Arthritis Mother    High Cholesterol Mother    Anxiety disorder Father    Depression Father    Colon polyps Father    Heart disease Father    High Cholesterol Father    High blood pressure Father    Drug abuse Brother    Asthma Brother    COPD Brother    Depression Brother    Arthritis Maternal Grandmother    Depression Maternal Grandfather    Depression Paternal Grandmother    Alcohol abuse Paternal Grandfather    Heart disease Paternal Grandfather    Colon cancer Neg Hx    Esophageal cancer Neg Hx    Pancreatic cancer Neg Hx    Stomach cancer Neg Hx    Liver disease Neg Hx    Rectal cancer Neg Hx     Social History   Socioeconomic History   Marital status: Single    Spouse name: Not on file    Number of children: 1   Years of education: Not on file   Highest education level: Master's degree (e.g., MA, MS, MEng, MEd, MSW, MBA)  Occupational History   Occupation: MANAGER  Tobacco Use   Smoking status: Never    Passive exposure: Never   Smokeless tobacco: Never  Vaping Use   Vaping status: Never Used  Substance and Sexual Activity   Alcohol use: Yes    Comment: socially   Drug use: Never   Sexual activity: Not Currently  Other Topics Concern   Not on file  Social History Narrative   Not on file   Social Drivers of Health   Financial Resource Strain: Low Risk  (08/27/2023)   Overall Financial Resource Strain (CARDIA)    Difficulty of Paying Living Expenses: Not hard at all  Food Insecurity: No Food Insecurity (08/27/2023)   Hunger Vital  Sign    Worried About Programme researcher, broadcasting/film/video in the Last Year: Never true    Ran Out of Food in the Last Year: Never true  Transportation Needs: No Transportation Needs (08/27/2023)   PRAPARE - Administrator, Civil Service (Medical): No    Lack of Transportation (Non-Medical): No  Physical Activity: Insufficiently Active (08/27/2023)   Exercise Vital Sign    Days of Exercise per Week: 1 day    Minutes of Exercise per Session: 20 min  Stress: Stress Concern Present (08/27/2023)   [employer] of Occupational Health - Occupational Stress Questionnaire    Feeling of Stress : Very much  Social Connections: Unknown (08/27/2023)   Social Connection and Isolation Panel    Frequency of Communication with Friends and Family: Twice a week    Frequency of Social Gatherings with Friends and Family: Patient declined    Attends Religious Services: Never    Database administrator or Organizations: No    Attends Engineer, structural: Not on file    Marital Status: Divorced  Catering manager Violence: Not on file    Past Medical History, Surgical history, Social history, and Family history were reviewed and updated as  appropriate.   Please see review of systems for further details on the patient's review from today.   Objective:   Physical Exam:  LMP 02/04/2019   Physical Exam Constitutional:      General: She is not in acute distress.  Musculoskeletal:        General: No deformity.   Neurological:     Mental Status: She is alert and oriented to person, place, and time.     Coordination: Coordination normal.   Psychiatric:        Attention and Perception: Attention and perception normal. She does not perceive auditory or visual hallucinations.        Mood and Affect: Mood normal. Mood is not anxious or depressed. Affect is not labile, blunt, angry or inappropriate.        Speech: Speech normal.        Behavior: Behavior normal.        Thought Content: Thought content normal. Thought content is not paranoid or delusional. Thought content does not include homicidal or suicidal ideation. Thought content does not include homicidal or suicidal plan.        Cognition and Memory: Cognition and memory normal.        Judgment: Judgment normal.     Comments: Insight intact     Lab Review:     Component Value Date/Time   NA 138 08/31/2023 1403   K 4.1 08/31/2023 1403   CL 97 08/31/2023 1403   CO2 30 08/31/2023 1403   GLUCOSE 104 (H) 08/31/2023 1403   BUN 8 08/31/2023 1403   CREATININE 0.75 08/31/2023 1403   CALCIUM  9.9 08/31/2023 1403   PROT 7.4 11/24/2023 1121   PROT 7.1 10/21/2022 0914   ALBUMIN 4.6 11/24/2023 1121   ALBUMIN 4.9 10/21/2022 0914   AST 89 (H) 11/24/2023 1121   ALT 115 (H) 11/24/2023 1121   ALT 39 (H) 04/18/2019 0821   ALKPHOS 118 (H) 11/24/2023 1121   BILITOT 0.6 11/24/2023 1121   BILITOT 0.2 10/21/2022 0914   GFRNONAA >60 03/17/2023 1152       Component Value Date/Time   WBC 6.0 08/31/2023 1403   RBC 4.21 08/31/2023 1403   HGB 13.3 08/31/2023 1403   HCT 40.8 08/31/2023 1403  PLT 200.0 08/31/2023 1403   MCV 97.0 08/31/2023 1403   MCH 30.6 03/17/2023 1152    MCHC 32.7 08/31/2023 1403   RDW 15.5 08/31/2023 1403   LYMPHSABS 1.5 08/31/2023 1403   MONOABS 0.4 08/31/2023 1403   EOSABS 0.0 08/31/2023 1403   BASOSABS 0.0 08/31/2023 1403    No results found for: POCLITH, LITHIUM   No results found for: PHENYTOIN, PHENOBARB, VALPROATE, CBMZ   .res Assessment: Plan:    Plan:  Gabapentin  300mg  TID Cymbalta  40mg  daily Clonazepam  0.5mg  BID prn  Lamictal  25mg  BID - will see PCP to confirm if rash related to Lamictal   Reduced Seroquel  100 to 50mg  at hs at last visit bc she felt too sedating it knocked me out.  D/C Hydroxyzine  50mg  every 6 hours as needed for anxiety - dry mouth - stopped taking.  Recommend continuing psychotherapy.   RTC 4 weeks   25 minutes spent dedicated to the care of this patient on the date of this encounter to include pre-visit review of records, ordering of medication, post visit documentation, and face-to-face time with the patient discussing depression, anxiety and insomnia. Discussed continuing current medication regimen.  Discussed potential benefits, risk, and side effects of benzodiazepines to include potential risk of tolerance and dependence, as well as possible drowsiness.  Advised patient not to drive if experiencing drowsiness and to take lowest possible effective dose to minimize risk of dependence and tolerance.   Patient advised to contact office with any questions, adverse effects, or acute worsening in signs and symptoms.  There are no diagnoses linked to this encounter.   Please see After Visit Summary for patient specific instructions.  Future Appointments  Date Time Provider Department Center  05/28/2024  4:30 PM Adelai Achey Nattalie, NP [facility] None    No orders of the defined types were placed in this encounter.     -------------------------------

## 2024-05-30 ENCOUNTER — Other Ambulatory Visit: Payer: Self-pay | Admitting: Adult Health

## 2024-05-30 DIAGNOSIS — F411 Generalized anxiety disorder: Secondary | ICD-10-CM

## 2024-05-30 DIAGNOSIS — F332 Major depressive disorder, recurrent severe without psychotic features: Secondary | ICD-10-CM

## 2024-05-30 DIAGNOSIS — F5101 Primary insomnia: Secondary | ICD-10-CM

## 2024-05-31 MED ORDER — QUETIAPINE FUMARATE 50 MG PO TABS: 50.0000 mg | ORAL_TABLET | Freq: Every day | ORAL | 0 refills | Status: DC

## 2024-06-29 ENCOUNTER — Other Ambulatory Visit: Payer: Self-pay | Admitting: Adult Health

## 2024-06-29 ENCOUNTER — Other Ambulatory Visit: Payer: Self-pay | Admitting: Family Medicine

## 2024-06-29 DIAGNOSIS — F411 Generalized anxiety disorder: Secondary | ICD-10-CM

## 2024-06-29 DIAGNOSIS — E785 Hyperlipidemia, unspecified: Secondary | ICD-10-CM

## 2024-06-29 DIAGNOSIS — E039 Hypothyroidism, unspecified: Secondary | ICD-10-CM

## 2024-06-29 DIAGNOSIS — F5101 Primary insomnia: Secondary | ICD-10-CM

## 2024-06-29 DIAGNOSIS — F332 Major depressive disorder, recurrent severe without psychotic features: Secondary | ICD-10-CM

## 2024-06-29 NOTE — Telephone Encounter (Signed)
 Sent MyChart message to schedule FU, due this month.

## 2024-07-02 ENCOUNTER — Other Ambulatory Visit: Payer: Self-pay | Admitting: Adult Health

## 2024-07-02 DIAGNOSIS — F411 Generalized anxiety disorder: Secondary | ICD-10-CM

## 2024-07-02 DIAGNOSIS — F5101 Primary insomnia: Secondary | ICD-10-CM

## 2024-07-02 NOTE — Telephone Encounter (Signed)
 Pt called at 10:57 requesting refill of Clonazepam  to   [facility] [address] [place] - [address] [place], [place] [address] Phone: [phone]  Fax: [phone]    Next appt 8/7

## 2024-07-12 ENCOUNTER — Telehealth: Payer: PRIVATE HEALTH INSURANCE | Admitting: Adult Health

## 2024-07-18 ENCOUNTER — Telehealth (INDEPENDENT_AMBULATORY_CARE_PROVIDER_SITE_OTHER): Payer: PRIVATE HEALTH INSURANCE | Admitting: Adult Health

## 2024-07-18 ENCOUNTER — Encounter: Payer: Self-pay | Admitting: Adult Health

## 2024-07-18 DIAGNOSIS — F32A Depression, unspecified: Secondary | ICD-10-CM | POA: Diagnosis not present

## 2024-07-18 DIAGNOSIS — F419 Anxiety disorder, unspecified: Secondary | ICD-10-CM

## 2024-07-18 DIAGNOSIS — G4700 Insomnia, unspecified: Secondary | ICD-10-CM | POA: Diagnosis not present

## 2024-07-18 DIAGNOSIS — F332 Major depressive disorder, recurrent severe without psychotic features: Secondary | ICD-10-CM

## 2024-07-18 DIAGNOSIS — F411 Generalized anxiety disorder: Secondary | ICD-10-CM

## 2024-07-18 DIAGNOSIS — F5101 Primary insomnia: Secondary | ICD-10-CM

## 2024-07-18 NOTE — Progress Notes (Signed)
 Amy Mcintyre [id number] November 05, 1966 57 y.o.  Virtual Visit via Video Note  I connected with pt @ on 07/18/24 at  8:30 AM EDT by a video enabled telemedicine application and verified that I am speaking with the correct person using two identifiers.   I discussed the limitations of evaluation and management by telemedicine and the availability of in person appointments. The patient expressed understanding and agreed to proceed.  I discussed the assessment and treatment plan with the patient. The patient was provided an opportunity to ask questions and all were answered. The patient agreed with the plan and demonstrated an understanding of the instructions.   The patient was advised to call back or seek an in-person evaluation if the symptoms worsen or if the condition fails to improve as anticipated.  I provided 20 minutes of non-face-to-face time during this encounter.  The patient was located at home.  The provider was located at [facility].   [name], NP   Subjective:   Patient ID:  Amy Mcintyre is a 58 y.o. (DOB 10-Sep-1966) female.  Chief Complaint: No chief complaint on file.   HPI Amy Mcintyre presents for follow-up of anxiety, depression and insomnia.   Describes mood today as ok. Pleasant. Reports tearfulness at times. Mood symptoms - denies depression - sad about the way her relationship is going. Reports increased anxiety - relationship. Reports irritability at times. Reports decreased interest and motivation. Reports having to push herself. Denies panic attacks. Reports some worry - relationship. Denies rumination and over thinking. Denies obsessive thoughts or acts. Reports mood as stable. Stating I feel pretty close to normal. Taking medications as prescribed.  Energy levels lower - not where it should be. Active, has a regular exercise routine - walking 30 minutes a day. Plans to join a gym. Reports enjoying some usual interests and activities.  Lives with ex-husband - not going well. Has a daughter - age 44. Has a joined a church. Appetite adequate. Weight gain - 165 pounds. Working with a nutritionist - Metformin  750mg  daily. Planning to start Monjouro if approved. Sleeps well most nights. Averages 5 to 6 hours. Reports decreased daytime napping.  Reports focus and concentration has improved. Completing tasks. Managing aspects of household. Retired. Denies SI or HI.  Denies AH or VH. Denies self harm. Denies substance use.   Current therapist - Glendia Salt  Past Psychiatric Medication Trials: Paxil Prozac Sertraline  Nortriptyline Wellbutrin - not helpful for depression.  Doxepin  BuSpar  Abilify-increased heart rate Rexulti -Helpful for mood and anxiety Seroquel -adverse effects Gabapentin - takes for jaw pain Lunesta-not effective Ambien- parasomnias Trazodone-ineffective Belsomra-worsening insomnia Klonopin      Review of Systems:  Review of Systems  Musculoskeletal:  Negative for gait problem.  Neurological:  Negative for tremors.  Psychiatric/Behavioral:         Please refer to HPI    Medications: I have reviewed the patient's current medications.  Current Outpatient Medications  Medication Sig Dispense Refill   atorvastatin  (LIPITOR) 40 MG tablet TAKE 1 TABLET BY MOUTH AT BEDTIME . APPOINTMENT REQUIRED FOR FUTURE REFILLS 30 tablet 0   clonazePAM  (KLONOPIN ) 0.5 MG tablet Take 1 tablet by mouth twice daily as needed for anxiety 60 tablet 0   DULoxetine  (CYMBALTA ) 20 MG capsule Take 2 capsules by mouth once daily 60 capsule 0   gabapentin  (NEURONTIN ) 300 MG capsule Take 1 capsule (300 mg total) by mouth 3 (three) times daily. 90 capsule 1   hydrOXYzine  (ATARAX ) 50 MG tablet Take 1 tablet (50 mg total)  by mouth every 6 (six) hours as needed. 120 tablet 0   lamoTRIgine  (LAMICTAL ) 25 MG tablet Take 1 tablet (25 mg total) by mouth 2 (two) times daily. 60 tablet 1   levothyroxine  (SYNTHROID ) 75 MCG tablet Take 1  tablet (75 mcg total) by mouth daily. NEEDS APPOINTMENT FOR FURTHER REFILLS 30 tablet 0   pantoprazole  (PROTONIX ) 40 MG tablet Take 1 tablet (40 mg total) by mouth daily. Needs appointment for further refills. 30 tablet 0   QUEtiapine  (SEROQUEL ) 100 MG tablet Take 1 tablet (100 mg total) by mouth at bedtime. 30 tablet 1   QUEtiapine  (SEROQUEL ) 50 MG tablet TAKE 1 TABLET BY MOUTH AT BEDTIME 30 tablet 0   No current facility-administered medications for this visit.    Medication Side Effects: None  Allergies:  Allergies  Allergen Reactions   Ambien [Zolpidem Tartrate] Other (See Comments)    Sleep walking   Erythromycin Swelling    Top lip swelled up.   Penicillins Swelling    Childhood reaction    Past Medical History:  Diagnosis Date   Allergy    Anxiety    Cataracts, bilateral    Colitis    Depression    Eosinophilic esophagitis    Fatty liver    Gastritis    GERD (gastroesophageal reflux disease)    Hyperlipidemia    Pre-diabetes    Sleep apnea    Thyroid  disease    TMJ (dislocation of temporomandibular joint)     Family History  Problem Relation Age of Onset   Anxiety disorder Mother    Panic disorder Mother    OCD Mother    Depression Mother    Arthritis Mother    High Cholesterol Mother    Anxiety disorder Father    Depression Father    Colon polyps Father    Heart disease Father    High Cholesterol Father    High blood pressure Father    Drug abuse Brother    Asthma Brother    COPD Brother    Depression Brother    Arthritis Maternal Grandmother    Depression Maternal Grandfather    Depression Paternal Grandmother    Alcohol abuse Paternal Grandfather    Heart disease Paternal Grandfather    Colon cancer Neg Hx    Esophageal cancer Neg Hx    Pancreatic cancer Neg Hx    Stomach cancer Neg Hx    Liver disease Neg Hx    Rectal cancer Neg Hx     Social History   Socioeconomic History   Marital status: Single    Spouse name: Not on file    Number of children: 1   Years of education: Not on file   Highest education level: Master's degree (e.g., MA, MS, MEng, MEd, MSW, MBA)  Occupational History   Occupation: MANAGER  Tobacco Use   Smoking status: Never    Passive exposure: Never   Smokeless tobacco: Never  Vaping Use   Vaping status: Never Used  Substance and Sexual Activity   Alcohol use: Yes    Comment: socially   Drug use: Never   Sexual activity: Not Currently  Other Topics Concern   Not on file  Social History Narrative   Not on file   Social Drivers of Health   Financial Resource Strain: Low Risk  (08/27/2023)   Overall Financial Resource Strain (CARDIA)    Difficulty of Paying Living Expenses: Not hard at all  Food Insecurity: No Food Insecurity (08/27/2023)  Hunger Vital Sign    Worried About Running Out of Food in the Last Year: Never true    Ran Out of Food in the Last Year: Never true  Transportation Needs: No Transportation Needs (08/27/2023)   PRAPARE - Administrator, Civil Service (Medical): No    Lack of Transportation (Non-Medical): No  Physical Activity: Insufficiently Active (08/27/2023)   Exercise Vital Sign    Days of Exercise per Week: 1 day    Minutes of Exercise per Session: 20 min  Stress: Stress Concern Present (08/27/2023)   [employer] of Occupational Health - Occupational Stress Questionnaire    Feeling of Stress : Very much  Social Connections: Unknown (08/27/2023)   Social Connection and Isolation Panel    Frequency of Communication with Friends and Family: Twice a week    Frequency of Social Gatherings with Friends and Family: Patient declined    Attends Religious Services: Never    Database administrator or Organizations: No    Attends Engineer, structural: Not on file    Marital Status: Divorced  Catering manager Violence: Not on file    Past Medical History, Surgical history, Social history, and Family history were reviewed and updated as  appropriate.   Please see review of systems for further details on the patient's review from today.   Objective:   Physical Exam:  LMP 02/04/2019   Physical Exam Constitutional:      General: She is not in acute distress. Musculoskeletal:        General: No deformity.  Neurological:     Mental Status: She is alert and oriented to person, place, and time.     Coordination: Coordination normal.  Psychiatric:        Attention and Perception: Attention and perception normal. She does not perceive auditory or visual hallucinations.        Mood and Affect: Mood normal. Mood is not anxious or depressed. Affect is not labile, blunt, angry or inappropriate.        Speech: Speech normal.        Behavior: Behavior normal.        Thought Content: Thought content normal. Thought content is not paranoid or delusional. Thought content does not include homicidal or suicidal ideation. Thought content does not include homicidal or suicidal plan.        Cognition and Memory: Cognition and memory normal.        Judgment: Judgment normal.     Comments: Insight intact     Lab Review:     Component Value Date/Time   NA 138 08/31/2023 1403   K 4.1 08/31/2023 1403   CL 97 08/31/2023 1403   CO2 30 08/31/2023 1403   GLUCOSE 104 (H) 08/31/2023 1403   BUN 8 08/31/2023 1403   CREATININE 0.75 08/31/2023 1403   CALCIUM  9.9 08/31/2023 1403   PROT 7.4 11/24/2023 1121   PROT 7.1 10/21/2022 0914   ALBUMIN 4.6 11/24/2023 1121   ALBUMIN 4.9 10/21/2022 0914   AST 89 (H) 11/24/2023 1121   ALT 115 (H) 11/24/2023 1121   ALT 39 (H) 04/18/2019 0821   ALKPHOS 118 (H) 11/24/2023 1121   BILITOT 0.6 11/24/2023 1121   BILITOT 0.2 10/21/2022 0914   GFRNONAA >60 03/17/2023 1152       Component Value Date/Time   WBC 6.0 08/31/2023 1403   RBC 4.21 08/31/2023 1403   HGB 13.3 08/31/2023 1403   HCT 40.8 08/31/2023 1403   PLT  200.0 08/31/2023 1403   MCV 97.0 08/31/2023 1403   MCH 30.6 03/17/2023 1152   MCHC  32.7 08/31/2023 1403   RDW 15.5 08/31/2023 1403   LYMPHSABS 1.5 08/31/2023 1403   MONOABS 0.4 08/31/2023 1403   EOSABS 0.0 08/31/2023 1403   BASOSABS 0.0 08/31/2023 1403    No results found for: POCLITH, LITHIUM   No results found for: PHENYTOIN, PHENOBARB, VALPROATE, CBMZ   .res Assessment: Plan:    Plan:  Gabapentin  300mg  TID Cymbalta  40mg  daily Clonazepam  0.5mg  BID prn Seroquel  50mg  to 25mg  at hs   D/C Lamictal  - possible rash D/C Hydroxyzine  50mg  every 6 hours as needed for anxiety - dry mouth - stopped taking.  Recommend continuing psychotherapy.   RTC 4 weeks   20 minutes spent dedicated to the care of this patient on the date of this encounter to include pre-visit review of records, ordering of medication, post visit documentation, and face-to-face time with the patient discussing depression, anxiety and insomnia. Discussed continuing current medication regimen.  Discussed potential benefits, risk, and side effects of benzodiazepines to include potential risk of tolerance and dependence, as well as possible drowsiness.  Advised patient not to drive if experiencing drowsiness and to take lowest possible effective dose to minimize risk of dependence and tolerance.   Patient advised to contact office with any questions, adverse effects, or acute worsening in signs and symptoms.  There are no diagnoses linked to this encounter.   Please see After Visit Summary for patient specific instructions.  Future Appointments  Date Time Provider Department Center  07/18/2024  8:30 AM Lenvil Swaim Nattalie, NP [facility] None    No orders of the defined types were placed in this encounter.     -------------------------------

## 2024-07-20 ENCOUNTER — Telehealth: Payer: Self-pay | Admitting: Adult Health

## 2024-07-20 MED ORDER — QUETIAPINE FUMARATE 50 MG PO TABS: 50.0000 mg | ORAL_TABLET | Freq: Every day | ORAL | 0 refills | Status: DC

## 2024-07-20 NOTE — Telephone Encounter (Signed)
 Next appt is 08/15/24. Requesting refill on her Seroquel  25 mg. Called to:  [facility] [address] [place] - [address]   Phone: [phone]  Fax: [phone]     Layan told me it has gotten much worse with her boyfriend now.  Jaydynn's phone number is [phone].

## 2024-07-20 NOTE — Telephone Encounter (Signed)
 Sent, it is 50 mg tablet, takes 1/2 to 1.

## 2024-07-25 ENCOUNTER — Other Ambulatory Visit: Payer: Self-pay | Admitting: Family Medicine

## 2024-07-25 ENCOUNTER — Other Ambulatory Visit: Payer: Self-pay | Admitting: Adult Health

## 2024-07-25 DIAGNOSIS — E785 Hyperlipidemia, unspecified: Secondary | ICD-10-CM

## 2024-07-25 DIAGNOSIS — F411 Generalized anxiety disorder: Secondary | ICD-10-CM

## 2024-07-25 DIAGNOSIS — F332 Major depressive disorder, recurrent severe without psychotic features: Secondary | ICD-10-CM

## 2024-07-25 DIAGNOSIS — F5101 Primary insomnia: Secondary | ICD-10-CM

## 2024-07-25 DIAGNOSIS — K219 Gastro-esophageal reflux disease without esophagitis: Secondary | ICD-10-CM

## 2024-07-25 DIAGNOSIS — E039 Hypothyroidism, unspecified: Secondary | ICD-10-CM

## 2024-07-26 NOTE — Telephone Encounter (Signed)
 LF 7/28, due 8/25

## 2024-07-27 ENCOUNTER — Other Ambulatory Visit: Payer: Self-pay | Admitting: Family Medicine

## 2024-07-27 DIAGNOSIS — E039 Hypothyroidism, unspecified: Secondary | ICD-10-CM

## 2024-07-27 DIAGNOSIS — K219 Gastro-esophageal reflux disease without esophagitis: Secondary | ICD-10-CM

## 2024-07-27 DIAGNOSIS — E785 Hyperlipidemia, unspecified: Secondary | ICD-10-CM

## 2024-07-27 NOTE — Telephone Encounter (Signed)
Due 8/25

## 2024-07-30 ENCOUNTER — Other Ambulatory Visit: Payer: Self-pay | Admitting: Family Medicine

## 2024-07-30 ENCOUNTER — Other Ambulatory Visit: Payer: Self-pay | Admitting: Adult Health

## 2024-07-30 DIAGNOSIS — K219 Gastro-esophageal reflux disease without esophagitis: Secondary | ICD-10-CM

## 2024-07-30 DIAGNOSIS — F5101 Primary insomnia: Secondary | ICD-10-CM

## 2024-07-30 DIAGNOSIS — F411 Generalized anxiety disorder: Secondary | ICD-10-CM

## 2024-07-30 DIAGNOSIS — E785 Hyperlipidemia, unspecified: Secondary | ICD-10-CM

## 2024-08-15 ENCOUNTER — Encounter: Payer: Self-pay | Admitting: Adult Health

## 2024-08-15 ENCOUNTER — Telehealth: Payer: PRIVATE HEALTH INSURANCE | Admitting: Adult Health

## 2024-08-15 DIAGNOSIS — F411 Generalized anxiety disorder: Secondary | ICD-10-CM

## 2024-08-15 DIAGNOSIS — F332 Major depressive disorder, recurrent severe without psychotic features: Secondary | ICD-10-CM

## 2024-08-15 DIAGNOSIS — F419 Anxiety disorder, unspecified: Secondary | ICD-10-CM | POA: Diagnosis not present

## 2024-08-15 DIAGNOSIS — F32A Depression, unspecified: Secondary | ICD-10-CM | POA: Diagnosis not present

## 2024-08-15 DIAGNOSIS — F5101 Primary insomnia: Secondary | ICD-10-CM

## 2024-08-15 DIAGNOSIS — G4700 Insomnia, unspecified: Secondary | ICD-10-CM

## 2024-08-15 MED ORDER — DULOXETINE HCL 20 MG PO CPEP: 40.0000 mg | ORAL_CAPSULE | Freq: Every day | ORAL | 2 refills | Status: DC

## 2024-08-15 MED ORDER — CLONAZEPAM 0.5 MG PO TABS: 0.5000 mg | ORAL_TABLET | Freq: Two times a day (BID) | ORAL | 2 refills | Status: DC | PRN

## 2024-08-15 MED ORDER — QUETIAPINE FUMARATE 50 MG PO TABS: ORAL_TABLET | ORAL | 2 refills | Status: DC

## 2024-08-15 NOTE — Progress Notes (Signed)
 Amy Mcintyre [id number] 01/15/1966 58 y.o.  Virtual Visit via Video Note  I connected with pt @ on 08/15/24 at  8:30 AM EDT by a video enabled telemedicine application and verified that I am speaking with the correct person using two identifiers.   I discussed the limitations of evaluation and management by telemedicine and the availability of in person appointments. The patient expressed understanding and agreed to proceed.  I discussed the assessment and treatment plan with the patient. The patient was provided an opportunity to ask questions and all were answered. The patient agreed with the plan and demonstrated an understanding of the instructions.   The patient was advised to call back or seek an in-person evaluation if the symptoms worsen or if the condition fails to improve as anticipated.  I provided 20 minutes of non-face-to-face time during this encounter.  The patient was located at home.  The provider was located at [facility].   [name], NP   Subjective:   Patient ID:  Amy Mcintyre is a 58 y.o. (DOB 1966/09/10) female.  Chief Complaint: No chief complaint on file.   HPI Amy Mcintyre presents for follow-up of anxiety, depression and insomnia.   Describes mood today as ok. Pleasant. Denies tearfulness. Mood symptoms - denies depression - I feel really good - normal again. Reports decreased anxiety. Denies irritability. Reports improved interest and motivation. Denies panic attacks. Denies worry, rumination and over thinking. Denies obsessive thoughts or acts. Reports mood as stable. Stating I feel like I'm doing better. Taking medications as prescribed.  Energy levels improved. Active, has a regular exercise routine - walking daily.  Reports enjoying some usual interests and activities. Lives with ex-husband - going a lot better. Has a daughter - age 41. Has a joined a church. Appetite adequate. Weight loss - 162 pounds. Working with a  nutritionist - Metformin  750mg  daily. Recently started on Monjouro. Sleeping better some nights than others - adjusting to CPAP machine. Averages 5 to 6 hours. Denies daytime napping.  Reports focus and concentration has improved. Completing tasks. Managing aspects of household. Retired. Denies SI or HI.  Denies AH or VH. Denies self harm. Denies substance use.   Current therapist - Glendia Salt  Past Psychiatric Medication Trials: Paxil Prozac Sertraline  Nortriptyline Wellbutrin - not helpful for depression.  Doxepin  BuSpar  Abilify-increased heart rate Rexulti -Helpful for mood and anxiety Seroquel -adverse effects Gabapentin - takes for jaw pain Lunesta-not effective Ambien- parasomnias Trazodone-ineffective Belsomra-worsening insomnia Klonopin    Review of Systems:  Review of Systems  Musculoskeletal:  Negative for gait problem.  Neurological:  Negative for tremors.  Psychiatric/Behavioral:         Please refer to HPI    Medications: I have reviewed the patient's current medications.  Current Outpatient Medications  Medication Sig Dispense Refill   atorvastatin  (LIPITOR) 40 MG tablet TAKE 1 TABLET BY MOUTH AT BEDTIME . APPOINTMENT REQUIRED FOR FUTURE REFILLS 30 tablet 0   clonazePAM  (KLONOPIN ) 0.5 MG tablet Take 1 tablet by mouth twice daily as needed for anxiety 60 tablet 0   DULoxetine  (CYMBALTA ) 20 MG capsule Take 2 capsules by mouth once daily 60 capsule 0   gabapentin  (NEURONTIN ) 300 MG capsule Take 1 capsule (300 mg total) by mouth 3 (three) times daily. 90 capsule 1   lamoTRIgine  (LAMICTAL ) 25 MG tablet Take 1 tablet (25 mg total) by mouth 2 (two) times daily. 60 tablet 1   levothyroxine  (SYNTHROID ) 75 MCG tablet Take 1 tablet (75 mcg total) by mouth daily. NEEDS  APPOINTMENT FOR FURTHER REFILLS 30 tablet 0   pantoprazole  (PROTONIX ) 40 MG tablet Take 1 tablet (40 mg total) by mouth daily. Needs appointment for further refills. 30 tablet 0   QUEtiapine  (SEROQUEL ) 50  MG tablet Take 1 tablet (50 mg total) by mouth at bedtime. 30 tablet 0   No current facility-administered medications for this visit.    Medication Side Effects: None  Allergies:  Allergies  Allergen Reactions   Ambien [Zolpidem Tartrate] Other (See Comments)    Sleep walking   Erythromycin Swelling    Top lip swelled up.   Penicillins Swelling    Childhood reaction    Past Medical History:  Diagnosis Date   Allergy    Anxiety    Cataracts, bilateral    Colitis    Depression    Eosinophilic esophagitis    Fatty liver    Gastritis    GERD (gastroesophageal reflux disease)    Hyperlipidemia    Pre-diabetes    Sleep apnea    Thyroid  disease    TMJ (dislocation of temporomandibular joint)     Family History  Problem Relation Age of Onset   Anxiety disorder Mother    Panic disorder Mother    OCD Mother    Depression Mother    Arthritis Mother    High Cholesterol Mother    Anxiety disorder Father    Depression Father    Colon polyps Father    Heart disease Father    High Cholesterol Father    High blood pressure Father    Drug abuse Brother    Asthma Brother    COPD Brother    Depression Brother    Arthritis Maternal Grandmother    Depression Maternal Grandfather    Depression Paternal Grandmother    Alcohol abuse Paternal Grandfather    Heart disease Paternal Grandfather    Colon cancer Neg Hx    Esophageal cancer Neg Hx    Pancreatic cancer Neg Hx    Stomach cancer Neg Hx    Liver disease Neg Hx    Rectal cancer Neg Hx     Social History   Socioeconomic History   Marital status: Single    Spouse name: Not on file   Number of children: 1   Years of education: Not on file   Highest education level: Master's degree (e.g., MA, MS, MEng, MEd, MSW, MBA)  Occupational History   Occupation: MANAGER  Tobacco Use   Smoking status: Never    Passive exposure: Never   Smokeless tobacco: Never  Vaping Use   Vaping status: Never Used  Substance and  Sexual Activity   Alcohol use: Yes    Comment: socially   Drug use: Never   Sexual activity: Not Currently  Other Topics Concern   Not on file  Social History Narrative   Not on file   Social Drivers of Health   Financial Resource Strain: Low Risk  (08/27/2023)   Overall Financial Resource Strain (CARDIA)    Difficulty of Paying Living Expenses: Not hard at all  Food Insecurity: No Food Insecurity (08/27/2023)   Hunger Vital Sign    Worried About Running Out of Food in the Last Year: Never true    Ran Out of Food in the Last Year: Never true  Transportation Needs: No Transportation Needs (08/27/2023)   PRAPARE - Administrator, Civil Service (Medical): No    Lack of Transportation (Non-Medical): No  Physical Activity: Insufficiently Active (08/27/2023)  Exercise Vital Sign    Days of Exercise per Week: 1 day    Minutes of Exercise per Session: 20 min  Stress: Stress Concern Present (08/27/2023)   [employer] of Occupational Health - Occupational Stress Questionnaire    Feeling of Stress : Very much  Social Connections: Unknown (08/27/2023)   Social Connection and Isolation Panel    Frequency of Communication with Friends and Family: Twice a week    Frequency of Social Gatherings with Friends and Family: Patient declined    Attends Religious Services: Never    Database administrator or Organizations: No    Attends Engineer, structural: Not on file    Marital Status: Divorced  Catering manager Violence: Not on file    Past Medical History, Surgical history, Social history, and Family history were reviewed and updated as appropriate.   Please see review of systems for further details on the patient's review from today.   Objective:   Physical Exam:  LMP 02/04/2019   Physical Exam Constitutional:      General: She is not in acute distress. Musculoskeletal:        General: No deformity.  Neurological:     Mental Status: She is alert and oriented  to person, place, and time.     Coordination: Coordination normal.  Psychiatric:        Attention and Perception: Attention and perception normal. She does not perceive auditory or visual hallucinations.        Mood and Affect: Mood normal. Mood is not anxious or depressed. Affect is not labile, blunt, angry or inappropriate.        Speech: Speech normal.        Behavior: Behavior normal.        Thought Content: Thought content normal. Thought content is not paranoid or delusional. Thought content does not include homicidal or suicidal ideation. Thought content does not include homicidal or suicidal plan.        Cognition and Memory: Cognition and memory normal.        Judgment: Judgment normal.     Comments: Insight intact     Lab Review:     Component Value Date/Time   NA 138 08/31/2023 1403   K 4.1 08/31/2023 1403   CL 97 08/31/2023 1403   CO2 30 08/31/2023 1403   GLUCOSE 104 (H) 08/31/2023 1403   BUN 8 08/31/2023 1403   CREATININE 0.75 08/31/2023 1403   CALCIUM  9.9 08/31/2023 1403   PROT 7.4 11/24/2023 1121   PROT 7.1 10/21/2022 0914   ALBUMIN 4.6 11/24/2023 1121   ALBUMIN 4.9 10/21/2022 0914   AST 89 (H) 11/24/2023 1121   ALT 115 (H) 11/24/2023 1121   ALT 39 (H) 04/18/2019 0821   ALKPHOS 118 (H) 11/24/2023 1121   BILITOT 0.6 11/24/2023 1121   BILITOT 0.2 10/21/2022 0914   GFRNONAA >60 03/17/2023 1152       Component Value Date/Time   WBC 6.0 08/31/2023 1403   RBC 4.21 08/31/2023 1403   HGB 13.3 08/31/2023 1403   HCT 40.8 08/31/2023 1403   PLT 200.0 08/31/2023 1403   MCV 97.0 08/31/2023 1403   MCH 30.6 03/17/2023 1152   MCHC 32.7 08/31/2023 1403   RDW 15.5 08/31/2023 1403   LYMPHSABS 1.5 08/31/2023 1403   MONOABS 0.4 08/31/2023 1403   EOSABS 0.0 08/31/2023 1403   BASOSABS 0.0 08/31/2023 1403    No results found for: POCLITH, LITHIUM   No results found for: PHENYTOIN,  PHENOBARB, VALPROATE, CBMZ   .res Assessment: Plan:     Plan:  Gabapentin  300mg  TID - jaw pain - PCP  Cymbalta  40mg  daily Clonazepam  0.5mg  BID prn Seroquel  50mg  - 1 to 2 at hs for sleep  Recommend continuing psychotherapy.   RTC 2 months  20 minutes spent dedicated to the care of this patient on the date of this encounter to include pre-visit review of records, ordering of medication, post visit documentation, and face-to-face time with the patient discussing depression, anxiety and insomnia. Discussed continuing current medication regimen.  Discussed potential benefits, risk, and side effects of benzodiazepines to include potential risk of tolerance and dependence, as well as possible drowsiness.  Advised patient not to drive if experiencing drowsiness and to take lowest possible effective dose to minimize risk of dependence and tolerance.   Patient advised to contact office with any questions, adverse effects, or acute worsening in signs and symptoms.  There are no diagnoses linked to this encounter.   Please see After Visit Summary for patient specific instructions.  Future Appointments  Date Time Provider Department Center  08/15/2024  8:30 AM Lashanna Angelo Nattalie, NP CP-CP None    No orders of the defined types were placed in this encounter.     -------------------------------

## 2024-08-27 ENCOUNTER — Other Ambulatory Visit: Payer: Self-pay | Admitting: Family Medicine

## 2024-08-27 DIAGNOSIS — K219 Gastro-esophageal reflux disease without esophagitis: Secondary | ICD-10-CM

## 2024-08-27 DIAGNOSIS — E785 Hyperlipidemia, unspecified: Secondary | ICD-10-CM

## 2024-08-31 ENCOUNTER — Other Ambulatory Visit: Payer: Self-pay | Admitting: Family Medicine

## 2024-08-31 DIAGNOSIS — K219 Gastro-esophageal reflux disease without esophagitis: Secondary | ICD-10-CM

## 2024-08-31 DIAGNOSIS — E785 Hyperlipidemia, unspecified: Secondary | ICD-10-CM

## 2024-10-15 ENCOUNTER — Telehealth (INDEPENDENT_AMBULATORY_CARE_PROVIDER_SITE_OTHER): Admitting: Adult Health

## 2024-10-15 ENCOUNTER — Encounter: Payer: Self-pay | Admitting: Adult Health

## 2024-10-15 DIAGNOSIS — G4700 Insomnia, unspecified: Secondary | ICD-10-CM | POA: Diagnosis not present

## 2024-10-15 DIAGNOSIS — F5101 Primary insomnia: Secondary | ICD-10-CM

## 2024-10-15 DIAGNOSIS — F411 Generalized anxiety disorder: Secondary | ICD-10-CM

## 2024-10-15 DIAGNOSIS — F332 Major depressive disorder, recurrent severe without psychotic features: Secondary | ICD-10-CM

## 2024-10-15 DIAGNOSIS — F419 Anxiety disorder, unspecified: Secondary | ICD-10-CM

## 2024-10-15 DIAGNOSIS — F32A Depression, unspecified: Secondary | ICD-10-CM | POA: Diagnosis not present

## 2024-10-15 NOTE — Progress Notes (Signed)
 Amy Mcintyre [id number] 1966/07/09 58 y.o.  Virtual Visit via Video Note  I connected with pt @ on 10/15/24 at  9:00 AM EST by a video enabled telemedicine application and verified that I am speaking with the correct person using two identifiers.   I discussed the limitations of evaluation and management by telemedicine and the availability of in person appointments. The patient expressed understanding and agreed to proceed.  I discussed the assessment and treatment plan with the patient. The patient was provided an opportunity to ask questions and all were answered. The patient agreed with the plan and demonstrated an understanding of the instructions.   The patient was advised to call back or seek an in-person evaluation if the symptoms worsen or if the condition fails to improve as anticipated.  I provided 25 minutes of non-face-to-face time during this encounter.  The patient was located at home.  The provider was located at [facility].   [name], NP   Subjective:   Patient ID:  Amy Mcintyre is a 58 y.o. (DOB 11/21/1966) female.  Chief Complaint: No chief complaint on file.   HPI Amy Mcintyre presents for follow-up of anxiety, depression and insomnia.   Describes mood today as not the best. Pleasant. Reports tearfulness. Mood symptoms - reports depression - more situational. Feels like the recent addition of Prednisone for a rash has contributed to current mood symptoms - has contacted PCP about her concerns - she has 2 more days of treatent. Reports increased tearfulness and crying. Reports increased anxiety. Denies irritability. Reports improved interest and motivation. Denies panic attacks. Reports worry, rumination and over thinking - steroids are making it worse. Denies obsessive thoughts or acts. Reports mood as variable - more down right now. Stating I feel like I could be doing better. Taking medications as prescribed.  Energy levels lower.  Active, has a regular exercise routine - walking daily.  Reports enjoying some usual interests and activities. Lives between her ex husband and an apartment. Has a daughter - age 78.   Appetite adequate. Weight gain - 162 to 165 pounds. No longer working with a nutritionist (not covered) - Metformin  750mg  daily. Stopped Monjouro - did not help with weight loss - cost issue. Sleeping better some nights than others - adjusting to CPAP machine. Averages 5 hours lately. Denies daytime napping.  Reports focus and concentration has improved. Completing tasks. Managing aspects of household. Retired. Attends church. Joined a book club. Denies SI or HI.  Denies AH or VH. Denies self harm. Denies substance use.   Current therapist - Glendia Salt  Past Psychiatric Medication Trials: Paxil Prozac Sertraline  Nortriptyline Wellbutrin - not helpful for depression.  Doxepin  BuSpar  Abilify-increased heart rate Rexulti -Helpful for mood and anxiety Seroquel -adverse effects Gabapentin - takes for jaw pain Lunesta-not effective Ambien- parasomnias Trazodone-ineffective Belsomra-worsening insomnia Klonopin    Review of Systems:  Review of Systems  Musculoskeletal:  Negative for gait problem.  Neurological:  Negative for tremors.  Psychiatric/Behavioral:         Please refer to HPI    Medications: I have reviewed the patient's current medications.  Current Outpatient Medications  Medication Sig Dispense Refill   atorvastatin  (LIPITOR) 40 MG tablet TAKE 1 TABLET BY MOUTH AT BEDTIME . APPOINTMENT REQUIRED FOR FUTURE REFILLS 30 tablet 0   clonazePAM  (KLONOPIN ) 0.5 MG tablet Take 1 tablet (0.5 mg total) by mouth 2 (two) times daily as needed. for anxiety 60 tablet 2   DULoxetine  (CYMBALTA ) 20 MG capsule Take 2 capsules (40  mg total) by mouth daily. 60 capsule 2   gabapentin  (NEURONTIN ) 300 MG capsule Take 1 capsule (300 mg total) by mouth 3 (three) times daily. 90 capsule 1   levothyroxine   (SYNTHROID ) 75 MCG tablet Take 1 tablet (75 mcg total) by mouth daily. NEEDS APPOINTMENT FOR FURTHER REFILLS 30 tablet 0   pantoprazole  (PROTONIX ) 40 MG tablet Take 1 tablet (40 mg total) by mouth daily. Needs appointment for further refills. 30 tablet 0   QUEtiapine  (SEROQUEL ) 50 MG tablet Take one to two tablets at bedtime for sleep. 60 tablet 2   No current facility-administered medications for this visit.    Medication Side Effects: None  Allergies:  Allergies  Allergen Reactions   Ambien [Zolpidem Tartrate] Other (See Comments)    Sleep walking   Erythromycin Swelling    Top lip swelled up.   Penicillins Swelling    Childhood reaction    Past Medical History:  Diagnosis Date   Allergy    Anxiety    Cataracts, bilateral    Colitis    Depression    Eosinophilic esophagitis    Fatty liver    Gastritis    GERD (gastroesophageal reflux disease)    Hyperlipidemia    Pre-diabetes    Sleep apnea    Thyroid  disease    TMJ (dislocation of temporomandibular joint)     Family History  Problem Relation Age of Onset   Anxiety disorder Mother    Panic disorder Mother    OCD Mother    Depression Mother    Arthritis Mother    High Cholesterol Mother    Anxiety disorder Father    Depression Father    Colon polyps Father    Heart disease Father    High Cholesterol Father    High blood pressure Father    Drug abuse Brother    Asthma Brother    COPD Brother    Depression Brother    Arthritis Maternal Grandmother    Depression Maternal Grandfather    Depression Paternal Grandmother    Alcohol abuse Paternal Grandfather    Heart disease Paternal Grandfather    Colon cancer Neg Hx    Esophageal cancer Neg Hx    Pancreatic cancer Neg Hx    Stomach cancer Neg Hx    Liver disease Neg Hx    Rectal cancer Neg Hx     Social History   Socioeconomic History   Marital status: Single    Spouse name: Not on file   Number of children: 1   Years of education: Not on file    Highest education level: Master's degree (e.g., MA, MS, MEng, MEd, MSW, MBA)  Occupational History   Occupation: MANAGER  Tobacco Use   Smoking status: Never    Passive exposure: Never   Smokeless tobacco: Never  Vaping Use   Vaping status: Never Used  Substance and Sexual Activity   Alcohol use: Yes    Comment: socially   Drug use: Never   Sexual activity: Not Currently  Other Topics Concern   Not on file  Social History Narrative   Not on file   Social Drivers of Health   Financial Resource Strain: Low Risk  (08/27/2023)   Overall Financial Resource Strain (CARDIA)    Difficulty of Paying Living Expenses: Not hard at all  Food Insecurity: No Food Insecurity (08/27/2023)   Hunger Vital Sign    Worried About Running Out of Food in the Last Year: Never true  Ran Out of Food in the Last Year: Never true  Transportation Needs: No Transportation Needs (08/27/2023)   PRAPARE - Administrator, Civil Service (Medical): No    Lack of Transportation (Non-Medical): No  Physical Activity: Insufficiently Active (08/27/2023)   Exercise Vital Sign    Days of Exercise per Week: 1 day    Minutes of Exercise per Session: 20 min  Stress: Stress Concern Present (08/27/2023)   [employer] of Occupational Health - Occupational Stress Questionnaire    Feeling of Stress : Very much  Social Connections: Unknown (08/27/2023)   Social Connection and Isolation Panel    Frequency of Communication with Friends and Family: Twice a week    Frequency of Social Gatherings with Friends and Family: Patient declined    Attends Religious Services: Never    Database Administrator or Organizations: No    Attends Engineer, Structural: Not on file    Marital Status: Divorced  Catering Manager Violence: Not on file    Past Medical History, Surgical history, Social history, and Family history were reviewed and updated as appropriate.   Please see review of systems for further details  on the patient's review from today.   Objective:   Physical Exam:  LMP 02/04/2019   Physical Exam Constitutional:      General: She is not in acute distress. Musculoskeletal:        General: No deformity.  Neurological:     Mental Status: She is alert and oriented to person, place, and time.     Coordination: Coordination normal.  Psychiatric:        Attention and Perception: Attention and perception normal. She does not perceive auditory or visual hallucinations.        Mood and Affect: Mood normal. Mood is not anxious or depressed. Affect is not labile, blunt, angry or inappropriate.        Speech: Speech normal.        Behavior: Behavior normal.        Thought Content: Thought content normal. Thought content is not paranoid or delusional. Thought content does not include homicidal or suicidal ideation. Thought content does not include homicidal or suicidal plan.        Cognition and Memory: Cognition and memory normal.        Judgment: Judgment normal.     Comments: Insight intact     Lab Review:     Component Value Date/Time   NA 138 08/31/2023 1403   K 4.1 08/31/2023 1403   CL 97 08/31/2023 1403   CO2 30 08/31/2023 1403   GLUCOSE 104 (H) 08/31/2023 1403   BUN 8 08/31/2023 1403   CREATININE 0.75 08/31/2023 1403   CALCIUM  9.9 08/31/2023 1403   PROT 7.4 11/24/2023 1121   PROT 7.1 10/21/2022 0914   ALBUMIN 4.6 11/24/2023 1121   ALBUMIN 4.9 10/21/2022 0914   AST 89 (H) 11/24/2023 1121   ALT 115 (H) 11/24/2023 1121   ALT 39 (H) 04/18/2019 0821   ALKPHOS 118 (H) 11/24/2023 1121   BILITOT 0.6 11/24/2023 1121   BILITOT 0.2 10/21/2022 0914   GFRNONAA >60 03/17/2023 1152       Component Value Date/Time   WBC 6.0 08/31/2023 1403   RBC 4.21 08/31/2023 1403   HGB 13.3 08/31/2023 1403   HCT 40.8 08/31/2023 1403   PLT 200.0 08/31/2023 1403   MCV 97.0 08/31/2023 1403   MCH 30.6 03/17/2023 1152   MCHC 32.7 08/31/2023 1403  RDW 15.5 08/31/2023 1403   LYMPHSABS 1.5  08/31/2023 1403   MONOABS 0.4 08/31/2023 1403   EOSABS 0.0 08/31/2023 1403   BASOSABS 0.0 08/31/2023 1403    No results found for: POCLITH, LITHIUM   No results found for: PHENYTOIN, PHENOBARB, VALPROATE, CBMZ   .res Assessment: Plan:    Plan:  Gabapentin  300mg  TID - jaw pain - PCP  Cymbalta  40mg  daily Clonazepam  0.5mg  BID prn Seroquel  50mg  - 1 to 2 at hs for sleep  Recommend continuing psychotherapy.   RTC 4 weeks  25 minutes spent dedicated to the care of this patient on the date of this encounter to include pre-visit review of records, ordering of medication, post visit documentation, and face-to-face time with the patient discussing depression, anxiety and insomnia. Discussed continuing current medication regimen.  Discussed potential benefits, risk, and side effects of benzodiazepines to include potential risk of tolerance and dependence, as well as possible drowsiness.  Advised patient not to drive if experiencing drowsiness and to take lowest possible effective dose to minimize risk of dependence and tolerance.   Patient advised to contact office with any questions, adverse effects, or acute worsening in signs and symptoms.  There are no diagnoses linked to this encounter.   Please see After Visit Summary for patient specific instructions.  Future Appointments  Date Time Provider Department Center  10/15/2024  9:00 AM Glen Blatchley Nattalie, NP [facility] None    No orders of the defined types were placed in this encounter.     -------------------------------

## 2024-10-31 ENCOUNTER — Other Ambulatory Visit: Payer: Self-pay | Admitting: Adult Health

## 2024-10-31 DIAGNOSIS — F5101 Primary insomnia: Secondary | ICD-10-CM

## 2024-11-12 ENCOUNTER — Encounter: Payer: Self-pay | Admitting: Adult Health

## 2024-11-12 ENCOUNTER — Telehealth: Admitting: Adult Health

## 2024-11-12 DIAGNOSIS — G4700 Insomnia, unspecified: Secondary | ICD-10-CM | POA: Diagnosis not present

## 2024-11-12 DIAGNOSIS — F419 Anxiety disorder, unspecified: Secondary | ICD-10-CM | POA: Diagnosis not present

## 2024-11-12 DIAGNOSIS — F5101 Primary insomnia: Secondary | ICD-10-CM

## 2024-11-12 DIAGNOSIS — F32A Depression, unspecified: Secondary | ICD-10-CM | POA: Diagnosis not present

## 2024-11-12 DIAGNOSIS — F411 Generalized anxiety disorder: Secondary | ICD-10-CM

## 2024-11-12 DIAGNOSIS — F332 Major depressive disorder, recurrent severe without psychotic features: Secondary | ICD-10-CM

## 2024-11-12 MED ORDER — DULOXETINE HCL 20 MG PO CPEP: 40.0000 mg | ORAL_CAPSULE | Freq: Every day | ORAL | 2 refills | Status: AC

## 2024-11-12 MED ORDER — CLONAZEPAM 0.5 MG PO TABS: 0.5000 mg | ORAL_TABLET | Freq: Two times a day (BID) | ORAL | 2 refills | Status: AC | PRN

## 2024-11-12 MED ORDER — QUETIAPINE FUMARATE 50 MG PO TABS: ORAL_TABLET | ORAL | 2 refills | Status: AC

## 2024-11-12 NOTE — Progress Notes (Signed)
 Amy Mcintyre [id number] 09/09/1966 58 y.o.  Virtual Visit via Video Note  I connected with pt @ on 11/12/24 at  8:30 AM EST by a video enabled telemedicine application and verified that I am speaking with the correct person using two identifiers.   I discussed the limitations of evaluation and management by telemedicine and the availability of in person appointments. The patient expressed understanding and agreed to proceed.  I discussed the assessment and treatment plan with the patient. The patient was provided an opportunity to ask questions and all were answered. The patient agreed with the plan and demonstrated an understanding of the instructions.   The patient was advised to call back or seek an in-person evaluation if the symptoms worsen or if the condition fails to improve as anticipated.  I provided 25 minutes of non-face-to-face time during this encounter.  The patient was located at home.  The provider was located at [facility].   [name], NP   Subjective:   Patient ID:  Amy Mcintyre is a 58 y.o. (DOB 10/11/1966) female.  Chief Complaint: No chief complaint on file.   HPI Amy Mcintyre presents for follow-up of anxiety, depression and insomnia.   Describes mood today as not to good. Pleasant. Reports tearfulness. Mood symptoms - reports depression, anxiety and irritability - more situational. Reports ongoing rash - plans to follow up with medical. Reports she is worrying more. Stating ,my anxiety is over the top. Reports staying in the bed more than she should be. Reports tearfulness and crying - every day. Reports lower interest and motivation. Denies panic attacks. Reports worry, rumination and over thinking. Reports obsessive thoughts or acts. Reports mood as variable - more down right now. Stating I don't feel like I'm doing any better. Taking medications as prescribed.  Energy levels lower. Active, has a regular exercise routine - walking  daily.  Reports she is unable to enjoy usual interests and activities. Lives between her ex husband and an apartment. Has a daughter - age 87.   Appetite adequate. Weight gain - 165 pounds.  Sleeping better some nights than others - adjusting to CPAP machine. Averages 4 to 5 hours lately. Denies daytime napping, but is laying in the bed more than she should.  Reports focus and concentration difficulties. Completing tasks. Managing aspects of household. Retired. Attends church. Joined 2 book clubs. Denies SI or HI.  Denies AH or VH. Denies self harm. Denies substance use.   Current therapist - Glendia Salt  Past Psychiatric Medication Trials: Paxil Prozac Sertraline  Nortriptyline Wellbutrin - not helpful for depression.  Doxepin  BuSpar  Abilify-increased heart rate Rexulti -Helpful for mood and anxiety Seroquel -adverse effects Gabapentin - takes for jaw pain Lunesta-not effective Ambien- parasomnias Trazodone-ineffective Belsomra-worsening insomnia Klonopin    Review of Systems:  Review of Systems  Musculoskeletal:  Negative for gait problem.  Neurological:  Negative for tremors.  Psychiatric/Behavioral:         Please refer to HPI    Medications: I have reviewed the patient's current medications.  Current Outpatient Medications  Medication Sig Dispense Refill   atorvastatin  (LIPITOR) 40 MG tablet TAKE 1 TABLET BY MOUTH AT BEDTIME . APPOINTMENT REQUIRED FOR FUTURE REFILLS 30 tablet 0   clonazePAM  (KLONOPIN ) 0.5 MG tablet Take 1 tablet (0.5 mg total) by mouth 2 (two) times daily as needed. for anxiety 60 tablet 2   DULoxetine  (CYMBALTA ) 20 MG capsule Take 2 capsules (40 mg total) by mouth daily. 60 capsule 2   gabapentin  (NEURONTIN ) 300 MG capsule Take  1 capsule (300 mg total) by mouth 3 (three) times daily. 90 capsule 1   levothyroxine  (SYNTHROID ) 75 MCG tablet Take 1 tablet (75 mcg total) by mouth daily. NEEDS APPOINTMENT FOR FURTHER REFILLS 30 tablet 0   pantoprazole   (PROTONIX ) 40 MG tablet Take 1 tablet (40 mg total) by mouth daily. Needs appointment for further refills. 30 tablet 0   QUEtiapine  (SEROQUEL ) 50 MG tablet TAKE 1 TO 2 TABLETS BY MOUTH AT BEDTIME FOR SLEEP 60 tablet 0   No current facility-administered medications for this visit.    Medication Side Effects: None  Allergies:  Allergies  Allergen Reactions   Ambien [Zolpidem Tartrate] Other (See Comments)    Sleep walking   Erythromycin Swelling    Top lip swelled up.   Penicillins Swelling    Childhood reaction    Past Medical History:  Diagnosis Date   Allergy    Anxiety    Cataracts, bilateral    Colitis    Depression    Eosinophilic esophagitis    Fatty liver    Gastritis    GERD (gastroesophageal reflux disease)    Hyperlipidemia    Pre-diabetes    Sleep apnea    Thyroid  disease    TMJ (dislocation of temporomandibular joint)     Family History  Problem Relation Age of Onset   Anxiety disorder Mother    Panic disorder Mother    OCD Mother    Depression Mother    Arthritis Mother    High Cholesterol Mother    Anxiety disorder Father    Depression Father    Colon polyps Father    Heart disease Father    High Cholesterol Father    High blood pressure Father    Drug abuse Brother    Asthma Brother    COPD Brother    Depression Brother    Arthritis Maternal Grandmother    Depression Maternal Grandfather    Depression Paternal Grandmother    Alcohol abuse Paternal Grandfather    Heart disease Paternal Grandfather    Colon cancer Neg Hx    Esophageal cancer Neg Hx    Pancreatic cancer Neg Hx    Stomach cancer Neg Hx    Liver disease Neg Hx    Rectal cancer Neg Hx     Social History   Socioeconomic History   Marital status: Single    Spouse name: Not on file   Number of children: 1   Years of education: Not on file   Highest education level: Master's degree (e.g., MA, MS, MEng, MEd, MSW, MBA)  Occupational History   Occupation: MANAGER  Tobacco  Use   Smoking status: Never    Passive exposure: Never   Smokeless tobacco: Never  Vaping Use   Vaping status: Never Used  Substance and Sexual Activity   Alcohol use: Yes    Comment: socially   Drug use: Never   Sexual activity: Not Currently  Other Topics Concern   Not on file  Social History Narrative   Not on file   Social Drivers of Health   Financial Resource Strain: Low Risk  (08/27/2023)   Overall Financial Resource Strain (CARDIA)    Difficulty of Paying Living Expenses: Not hard at all  Food Insecurity: No Food Insecurity (08/27/2023)   Hunger Vital Sign    Worried About Running Out of Food in the Last Year: Never true    Ran Out of Food in the Last Year: Never true  Transportation Needs: No  Transportation Needs (08/27/2023)   PRAPARE - Administrator, Civil Service (Medical): No    Lack of Transportation (Non-Medical): No  Physical Activity: Insufficiently Active (08/27/2023)   Exercise Vital Sign    Days of Exercise per Week: 1 day    Minutes of Exercise per Session: 20 min  Stress: Stress Concern Present (08/27/2023)   [employer] of Occupational Health - Occupational Stress Questionnaire    Feeling of Stress : Very much  Social Connections: Unknown (08/27/2023)   Social Connection and Isolation Panel    Frequency of Communication with Friends and Family: Twice a week    Frequency of Social Gatherings with Friends and Family: Patient declined    Attends Religious Services: Never    Database Administrator or Organizations: No    Attends Engineer, Structural: Not on file    Marital Status: Divorced  Catering Manager Violence: Not on file    Past Medical History, Surgical history, Social history, and Family history were reviewed and updated as appropriate.   Please see review of systems for further details on the patient's review from today.   Objective:   Physical Exam:  LMP 02/04/2019   Physical Exam Constitutional:       General: She is not in acute distress. Musculoskeletal:        General: No deformity.  Neurological:     Mental Status: She is alert and oriented to person, place, and time.     Coordination: Coordination normal.  Psychiatric:        Attention and Perception: Attention and perception normal. She does not perceive auditory or visual hallucinations.        Mood and Affect: Mood normal. Mood is not anxious or depressed. Affect is not labile, blunt, angry or inappropriate.        Speech: Speech normal.        Behavior: Behavior normal.        Thought Content: Thought content normal. Thought content is not paranoid or delusional. Thought content does not include homicidal or suicidal ideation. Thought content does not include homicidal or suicidal plan.        Cognition and Memory: Cognition and memory normal.        Judgment: Judgment normal.     Comments: Insight intact     Lab Review:     Component Value Date/Time   NA 138 08/31/2023 1403   K 4.1 08/31/2023 1403   CL 97 08/31/2023 1403   CO2 30 08/31/2023 1403   GLUCOSE 104 (H) 08/31/2023 1403   BUN 8 08/31/2023 1403   CREATININE 0.75 08/31/2023 1403   CALCIUM  9.9 08/31/2023 1403   PROT 7.4 11/24/2023 1121   PROT 7.1 10/21/2022 0914   ALBUMIN 4.6 11/24/2023 1121   ALBUMIN 4.9 10/21/2022 0914   AST 89 (H) 11/24/2023 1121   ALT 115 (H) 11/24/2023 1121   ALT 39 (H) 04/18/2019 0821   ALKPHOS 118 (H) 11/24/2023 1121   BILITOT 0.6 11/24/2023 1121   BILITOT 0.2 10/21/2022 0914   GFRNONAA >60 03/17/2023 1152       Component Value Date/Time   WBC 6.0 08/31/2023 1403   RBC 4.21 08/31/2023 1403   HGB 13.3 08/31/2023 1403   HCT 40.8 08/31/2023 1403   PLT 200.0 08/31/2023 1403   MCV 97.0 08/31/2023 1403   MCH 30.6 03/17/2023 1152   MCHC 32.7 08/31/2023 1403   RDW 15.5 08/31/2023 1403   LYMPHSABS 1.5 08/31/2023 1403  MONOABS 0.4 08/31/2023 1403   EOSABS 0.0 08/31/2023 1403   BASOSABS 0.0 08/31/2023 1403    No results found  for: POCLITH, LITHIUM   No results found for: PHENYTOIN, PHENOBARB, VALPROATE, CBMZ   .res Assessment: Plan:    Plan:  Gabapentin  300mg  TID - jaw pain - PCP  Cymbalta  40mg  daily Clonazepam  0.5mg  BID prn will an early refill/sent to a different pharmacy Seroquel  50mg  - 1 to 2 at hs for sleep  Restart Hydroxyzine  50mg  - 1/2 tablet daily as needed for anxiety   Recommend continuing psychotherapy.   RTC 4 weeks  25 minutes spent dedicated to the care of this patient on the date of this encounter to include pre-visit review of records, ordering of medication, post visit documentation, and face-to-face time with the patient discussing depression, anxiety and insomnia. Discussed continuing current medication regimen.  Discussed potential benefits, risk, and side effects of benzodiazepines to include potential risk of tolerance and dependence, as well as possible drowsiness.  Advised patient not to drive if experiencing drowsiness and to take lowest possible effective dose to minimize risk of dependence and tolerance.   Patient advised to contact office with any questions, adverse effects, or acute worsening in signs and symptoms.   There are no diagnoses linked to this encounter.   Please see After Visit Summary for patient specific instructions.  Future Appointments  Date Time Provider Department Center  11/12/2024  8:30 AM Brinlynn Gorton Nattalie, NP CP-CP None    No orders of the defined types were placed in this encounter.     -------------------------------

## 2024-11-23 ENCOUNTER — Telehealth: Payer: Self-pay | Admitting: Adult Health

## 2024-11-23 NOTE — Telephone Encounter (Signed)
 Pt LVM @ 9:24a requesting an early refill on Clonazepam  and Seroquel .  She said she discussed with [place]man she would be going out of town for the holidays.  Pt say the date on the bottle of the Clonazepam  is 11/26, so she can't get it until 12/26, but she's leaving on 12/22.  She said the Seroquel  has a different date but it would still be an early refill needed.   Send to  [facility] [address], [place] - [address] [place], [place] [address] Phone: [phone]  Fax: [phone]   Next appt 1/8

## 2024-11-23 NOTE — Telephone Encounter (Signed)
 Clonazepam  LF 11/26, due 12/24.

## 2024-11-23 NOTE — Telephone Encounter (Signed)
 Early RF authorized for 12/21 per provider approval.

## 2024-11-23 NOTE — Telephone Encounter (Signed)
 Pt said she talked to you about getting an early RF on clonazepam  for the holidays.   Clonazepam  LF 11/26, due 12/24.       Is asking to fill on 12/22. She has RF. Will authorize as appropriate.

## 2024-12-13 ENCOUNTER — Telehealth: Admitting: Adult Health

## 2024-12-13 ENCOUNTER — Encounter: Payer: Self-pay | Admitting: Adult Health

## 2024-12-13 DIAGNOSIS — F411 Generalized anxiety disorder: Secondary | ICD-10-CM | POA: Diagnosis not present

## 2024-12-13 DIAGNOSIS — F332 Major depressive disorder, recurrent severe without psychotic features: Secondary | ICD-10-CM | POA: Diagnosis not present

## 2024-12-13 DIAGNOSIS — F5101 Primary insomnia: Secondary | ICD-10-CM | POA: Diagnosis not present

## 2024-12-13 NOTE — Progress Notes (Signed)
 Amy Mcintyre [id number] 1966-07-03 59 y.o.  Virtual Visit via Video Note  I connected with pt @ on 12/13/2024 at  8:30 AM EST by a video enabled telemedicine application and verified that I am speaking with the correct person using two identifiers.   I discussed the limitations of evaluation and management by telemedicine and the availability of in person appointments. The patient expressed understanding and agreed to proceed.  I discussed the assessment and treatment plan with the patient. The patient was provided an opportunity to ask questions and all were answered. The patient agreed with the plan and demonstrated an understanding of the instructions.   The patient was advised to call back or seek an in-person evaluation if the symptoms worsen or if the condition fails to improve as anticipated.  I provided 25 minutes of non-face-to-face time during this encounter.  The patient was located at home.  The provider was located at [facility].   Amy LOISE Sayers, NP   Subjective:   Patient ID:  Amy Mcintyre is a 59 y.o. (DOB 01-20-1966) female.  Chief Complaint: No chief complaint on file.   HPI Amy Mcintyre presents for follow-up of anxiety, depression and insomnia.   Describes mood today as not too good. Pleasant. Reports tearfulness most days. Mood symptoms - reports depression, anxiety and irritability - mostly situational. Reports lower interest and motivation. Reports not taking care of her herself like she should - not showering. Reports worry, rumination and over thinking. Denies panic attacks. Denies obsessive thoughts or acts. Reports mood as variable - it's up and down. Stating I feel like I should be doing better. Taking medications as prescribed.  Energy levels lower. Active, does not have a regular exercise routine - plans to start. Reports she is able to enjoy some usual interests and activities. Lives between her ex husband and an apartment. Has a daughter  - age 38.   Appetite adequate. Weight gain - 165 pounds.  Sleeping better some nights than others - adjusting to CPAP machine. Averages 5 to 6 hours. Denies daytime napping, but is laying in the bed most of the day - here lately Reports focus and concentration difficulties - it's bad. Completing tasks. Managing aspects of household. Retired. Attends ladies group church. Joined 2 book clubs. Denies SI or HI.  Denies AH or VH. Denies self harm. Denies substance use.  Reports alcohol use - 3 to 4 days.  Current therapist - Amy Mcintyre  Past Psychiatric Medication Trials: Paxil Prozac Sertraline  Nortriptyline Wellbutrin - not helpful for depression.  Doxepin  BuSpar  Abilify-increased heart rate Rexulti -Helpful for mood and anxiety Seroquel -adverse effects Gabapentin - takes for jaw pain Lunesta-not effective Ambien- parasomnias Trazodone-ineffective Belsomra-worsening insomnia Klonopin   Review of Systems:  Review of Systems  Musculoskeletal:  Negative for gait problem.  Neurological:  Negative for tremors.  Psychiatric/Behavioral:         Please refer to HPI    Medications: I have reviewed the patient's current medications.  Current Outpatient Medications  Medication Sig Dispense Refill   atorvastatin  (LIPITOR) 40 MG tablet TAKE 1 TABLET BY MOUTH AT BEDTIME . APPOINTMENT REQUIRED FOR FUTURE REFILLS 30 tablet 0   clonazePAM  (KLONOPIN ) 0.5 MG tablet Take 1 tablet (0.5 mg total) by mouth 2 (two) times daily as needed. for anxiety 60 tablet 2   DULoxetine  (CYMBALTA ) 20 MG capsule Take 2 capsules (40 mg total) by mouth daily. 60 capsule 2   gabapentin  (NEURONTIN ) 300 MG capsule Take 1 capsule (300 mg total) by mouth 3 (  three) times daily. 90 capsule 1   levothyroxine  (SYNTHROID ) 75 MCG tablet Take 1 tablet (75 mcg total) by mouth daily. NEEDS APPOINTMENT FOR FURTHER REFILLS 30 tablet 0   pantoprazole  (PROTONIX ) 40 MG tablet Take 1 tablet (40 mg total) by mouth daily. Needs  appointment for further refills. 30 tablet 0   QUEtiapine  (SEROQUEL ) 50 MG tablet TAKE 1 TO 2 TABLETS BY MOUTH AT BEDTIME FOR SLEEP 60 tablet 2   No current facility-administered medications for this visit.    Medication Side Effects: None  Allergies: Allergies[1]  Past Medical History:  Diagnosis Date   Allergy    Anxiety    Cataracts, bilateral    Colitis    Depression    Eosinophilic esophagitis    Fatty liver    Gastritis    GERD (gastroesophageal reflux disease)    Hyperlipidemia    Pre-diabetes    Sleep apnea    Thyroid  disease    TMJ (dislocation of temporomandibular joint)     Family History  Problem Relation Age of Onset   Anxiety disorder Mother    Panic disorder Mother    OCD Mother    Depression Mother    Arthritis Mother    High Cholesterol Mother    Anxiety disorder Father    Depression Father    Colon polyps Father    Heart disease Father    High Cholesterol Father    High blood pressure Father    Drug abuse Brother    Asthma Brother    COPD Brother    Depression Brother    Arthritis Maternal Grandmother    Depression Maternal Grandfather    Depression Paternal Grandmother    Alcohol abuse Paternal Grandfather    Heart disease Paternal Grandfather    Colon cancer Neg Hx    Esophageal cancer Neg Hx    Pancreatic cancer Neg Hx    Stomach cancer Neg Hx    Liver disease Neg Hx    Rectal cancer Neg Hx     Social History   Socioeconomic History   Marital status: Single    Spouse name: Not on file   Number of children: 1   Years of education: Not on file   Highest education level: Master's degree (e.g., MA, MS, MEng, MEd, MSW, MBA)  Occupational History   Occupation: MANAGER  Tobacco Use   Smoking status: Never    Passive exposure: Never   Smokeless tobacco: Never  Vaping Use   Vaping status: Never Used  Substance and Sexual Activity   Alcohol use: Yes    Comment: socially   Drug use: Never   Sexual activity: Not Currently  Other  Topics Concern   Not on file  Social History Narrative   Not on file   Social Drivers of Health   Tobacco Use: Low Risk (12/13/2024)   Patient History    Smoking Tobacco Use: Never    Smokeless Tobacco Use: Never    Passive Exposure: Never  Financial Resource Strain: Low Risk (08/27/2023)   Overall Financial Resource Strain (CARDIA)    Difficulty of Paying Living Expenses: Not hard at all  Food Insecurity: No Food Insecurity (08/27/2023)   Hunger Vital Sign    Worried About Running Out of Food in the Last Year: Never true    Ran Out of Food in the Last Year: Never true  Transportation Needs: No Transportation Needs (08/27/2023)   PRAPARE - Administrator, Civil Service (Medical): No  Lack of Transportation (Non-Medical): No  Physical Activity: Insufficiently Active (08/27/2023)   Exercise Vital Sign    Days of Exercise per Week: 1 day    Minutes of Exercise per Session: 20 min  Stress: Stress Concern Present (08/27/2023)   [employer] of Occupational Health - Occupational Stress Questionnaire    Feeling of Stress : Very much  Social Connections: Unknown (08/27/2023)   Social Connection and Isolation Panel    Frequency of Communication with Friends and Family: Twice a week    Frequency of Social Gatherings with Friends and Family: Patient declined    Attends Religious Services: Never    Database Administrator or Organizations: No    Attends Engineer, Structural: Not on file    Marital Status: Divorced  Intimate Partner Violence: Not on file  Depression (PHQ2-9): Medium Risk (08/31/2023)   Depression (PHQ2-9)    PHQ-2 Score: 8  Alcohol Screen: Low Risk (08/27/2023)   Alcohol Screen    Last Alcohol Screening Score (AUDIT): 4  Housing: Low Risk (08/27/2023)   Housing    Last Housing Risk Score: 0  Utilities: Not on file  Health Literacy: Not on file    Past Medical History, Surgical history, Social history, and Family history were reviewed and updated  as appropriate.   Please see review of systems for further details on the patient's review from today.   Objective:   Physical Exam:  LMP 02/04/2019   Physical Exam Constitutional:      General: She is not in acute distress. Musculoskeletal:        General: No deformity.  Neurological:     Mental Status: She is alert and oriented to person, place, and time.     Coordination: Coordination normal.  Psychiatric:        Attention and Perception: Attention and perception normal. She does not perceive auditory or visual hallucinations.        Mood and Affect: Mood normal. Mood is not anxious or depressed. Affect is not labile, blunt, angry or inappropriate.        Speech: Speech normal.        Behavior: Behavior normal.        Thought Content: Thought content normal. Thought content is not paranoid or delusional. Thought content does not include homicidal or suicidal ideation. Thought content does not include homicidal or suicidal plan.        Cognition and Memory: Cognition and memory normal.        Judgment: Judgment normal.     Comments: Insight intact     Lab Review:     Component Value Date/Time   NA 138 08/31/2023 1403   K 4.1 08/31/2023 1403   CL 97 08/31/2023 1403   CO2 30 08/31/2023 1403   GLUCOSE 104 (H) 08/31/2023 1403   BUN 8 08/31/2023 1403   CREATININE 0.75 08/31/2023 1403   CALCIUM  9.9 08/31/2023 1403   PROT 7.4 11/24/2023 1121   PROT 7.1 10/21/2022 0914   ALBUMIN 4.6 11/24/2023 1121   ALBUMIN 4.9 10/21/2022 0914   AST 89 (H) 11/24/2023 1121   ALT 115 (H) 11/24/2023 1121   ALT 39 (H) 04/18/2019 0821   ALKPHOS 118 (H) 11/24/2023 1121   BILITOT 0.6 11/24/2023 1121   BILITOT 0.2 10/21/2022 0914   GFRNONAA >60 03/17/2023 1152       Component Value Date/Time   WBC 6.0 08/31/2023 1403   RBC 4.21 08/31/2023 1403   HGB 13.3 08/31/2023 1403  HCT 40.8 08/31/2023 1403   PLT 200.0 08/31/2023 1403   MCV 97.0 08/31/2023 1403   MCH 30.6 03/17/2023 1152   MCHC  32.7 08/31/2023 1403   RDW 15.5 08/31/2023 1403   LYMPHSABS 1.5 08/31/2023 1403   MONOABS 0.4 08/31/2023 1403   EOSABS 0.0 08/31/2023 1403   BASOSABS 0.0 08/31/2023 1403    No results found for: POCLITH, LITHIUM   No results found for: PHENYTOIN, PHENOBARB, VALPROATE, CBMZ   .res Assessment: Plan:    Plan:  Gabapentin  300mg  TID - jaw pain - PCP  Cymbalta  40mg  daily Clonazepam  0.5mg  BID prn will an early refill/sent to a different pharmacy Seroquel  50mg  - 1 to 2 at hs for sleep  Hydroxyzine  50mg  - 1/2 tablet daily as needed for anxiety   Recommend continuing psychotherapy.   RTC 3 months  25 minutes spent dedicated to the care of this patient on the date of this encounter to include pre-visit review of records, ordering of medication, post visit documentation, and face-to-face time with the patient discussing depression, anxiety and insomnia. Discussed continuing current medication regimen.  Discussed potential benefits, risk, and side effects of benzodiazepines to include potential risk of tolerance and dependence, as well as possible drowsiness.  Advised patient not to drive if experiencing drowsiness and to take lowest possible effective dose to minimize risk of dependence and tolerance.   Patient advised to contact office with any questions, adverse effects, or acute worsening in signs and symptoms.   Diagnoses and all orders for this visit:  Severe episode of recurrent major depressive disorder, without psychotic features (HCC)  Primary insomnia  Generalized anxiety disorder     Please see After Visit Summary for patient specific instructions.  No future appointments.   No orders of the defined types were placed in this encounter.     -------------------------------      [1]  Allergies Allergen Reactions   Ambien [Zolpidem Tartrate] Other (See Comments)    Sleep walking   Erythromycin Swelling    Top lip swelled up.   Penicillins  Swelling    Childhood reaction

## 2025-03-13 ENCOUNTER — Telehealth (INDEPENDENT_AMBULATORY_CARE_PROVIDER_SITE_OTHER): Admitting: Adult Health
